# Patient Record
Sex: Female | Born: 1960 | Race: White | Hispanic: No | Marital: Single | Smoking: Never smoker
Health system: Southern US, Community
[De-identification: ages and names within clinical notes are randomized; demographics above are authoritative.]

## PROBLEM LIST (undated history)

## (undated) ENCOUNTER — Ambulatory Visit: Admission: EM

## (undated) DIAGNOSIS — F329 Major depressive disorder, single episode, unspecified: Secondary | ICD-10-CM

## (undated) DIAGNOSIS — R112 Nausea with vomiting, unspecified: Secondary | ICD-10-CM

## (undated) DIAGNOSIS — N809 Endometriosis, unspecified: Secondary | ICD-10-CM

## (undated) DIAGNOSIS — K219 Gastro-esophageal reflux disease without esophagitis: Secondary | ICD-10-CM

## (undated) DIAGNOSIS — E109 Type 1 diabetes mellitus without complications: Secondary | ICD-10-CM

## (undated) DIAGNOSIS — F32A Depression, unspecified: Secondary | ICD-10-CM

## (undated) DIAGNOSIS — Z8489 Family history of other specified conditions: Secondary | ICD-10-CM

## (undated) DIAGNOSIS — D219 Benign neoplasm of connective and other soft tissue, unspecified: Secondary | ICD-10-CM

## (undated) DIAGNOSIS — Z9889 Other specified postprocedural states: Secondary | ICD-10-CM

## (undated) DIAGNOSIS — F419 Anxiety disorder, unspecified: Secondary | ICD-10-CM

## (undated) DIAGNOSIS — I10 Essential (primary) hypertension: Secondary | ICD-10-CM

## (undated) DIAGNOSIS — Z8619 Personal history of other infectious and parasitic diseases: Secondary | ICD-10-CM

## (undated) DIAGNOSIS — R011 Cardiac murmur, unspecified: Secondary | ICD-10-CM

## (undated) DIAGNOSIS — E119 Type 2 diabetes mellitus without complications: Secondary | ICD-10-CM

## (undated) DIAGNOSIS — F411 Generalized anxiety disorder: Secondary | ICD-10-CM

## (undated) DIAGNOSIS — F321 Major depressive disorder, single episode, moderate: Secondary | ICD-10-CM

## (undated) HISTORY — PX: ABDOMINAL HYSTERECTOMY: SHX81

## (undated) HISTORY — DX: Personal history of other infectious and parasitic diseases: Z86.19

## (undated) HISTORY — PX: TONSILLECTOMY: SHX5217

## (undated) HISTORY — DX: Anxiety disorder, unspecified: F41.9

## (undated) HISTORY — DX: Essential (primary) hypertension: I10

## (undated) HISTORY — PX: SHOULDER SURGERY: SHX246

## (undated) HISTORY — PX: TONSILLECTOMY: SUR1361

## (undated) HISTORY — DX: Major depressive disorder, single episode, unspecified: F32.9

## (undated) HISTORY — DX: Type 1 diabetes mellitus without complications: E10.9

## (undated) HISTORY — DX: Type 2 diabetes mellitus without complications: E11.9

## (undated) HISTORY — DX: Depression, unspecified: F32.A

## (undated) HISTORY — DX: Major depressive disorder, single episode, moderate: F32.1

## (undated) HISTORY — DX: Generalized anxiety disorder: F41.1

## (undated) HISTORY — PX: EYE SURGERY: SHX253

---

## 1998-09-23 ENCOUNTER — Emergency Department (HOSPITAL_COMMUNITY): Admission: EM | Admit: 1998-09-23 | Discharge: 1998-09-23 | Payer: Self-pay | Admitting: Emergency Medicine

## 1998-09-23 ENCOUNTER — Encounter: Payer: Self-pay | Admitting: Emergency Medicine

## 1999-08-07 ENCOUNTER — Emergency Department (HOSPITAL_COMMUNITY): Admission: EM | Admit: 1999-08-07 | Discharge: 1999-08-07 | Payer: Self-pay | Admitting: Emergency Medicine

## 2003-11-10 ENCOUNTER — Ambulatory Visit (HOSPITAL_COMMUNITY): Admission: RE | Admit: 2003-11-10 | Discharge: 2003-11-10 | Payer: Self-pay | Admitting: Orthopedic Surgery

## 2003-11-10 ENCOUNTER — Ambulatory Visit (HOSPITAL_BASED_OUTPATIENT_CLINIC_OR_DEPARTMENT_OTHER): Admission: RE | Admit: 2003-11-10 | Discharge: 2003-11-10 | Payer: Self-pay | Admitting: Orthopedic Surgery

## 2004-01-22 ENCOUNTER — Ambulatory Visit: Payer: Self-pay | Admitting: Obstetrics and Gynecology

## 2004-04-25 ENCOUNTER — Ambulatory Visit (HOSPITAL_COMMUNITY): Admission: RE | Admit: 2004-04-25 | Discharge: 2004-04-25 | Payer: Self-pay | Admitting: Endocrinology

## 2004-09-06 ENCOUNTER — Ambulatory Visit: Payer: Self-pay | Admitting: Family Medicine

## 2004-10-24 ENCOUNTER — Encounter: Admission: RE | Admit: 2004-10-24 | Discharge: 2004-10-24 | Payer: Self-pay | Admitting: Surgery

## 2005-02-05 ENCOUNTER — Ambulatory Visit: Payer: Self-pay | Admitting: Obstetrics and Gynecology

## 2005-02-18 ENCOUNTER — Ambulatory Visit: Payer: Self-pay | Admitting: Obstetrics and Gynecology

## 2005-02-21 ENCOUNTER — Ambulatory Visit: Payer: Self-pay | Admitting: Obstetrics and Gynecology

## 2005-08-20 ENCOUNTER — Ambulatory Visit: Payer: Self-pay | Admitting: Obstetrics and Gynecology

## 2006-02-23 ENCOUNTER — Ambulatory Visit: Payer: Self-pay | Admitting: Obstetrics and Gynecology

## 2007-03-18 ENCOUNTER — Ambulatory Visit: Payer: Self-pay | Admitting: Obstetrics and Gynecology

## 2008-06-21 ENCOUNTER — Ambulatory Visit: Payer: Self-pay | Admitting: Obstetrics and Gynecology

## 2008-12-05 ENCOUNTER — Encounter: Admission: RE | Admit: 2008-12-05 | Discharge: 2008-12-05 | Payer: Self-pay | Admitting: Endocrinology

## 2008-12-06 ENCOUNTER — Emergency Department (HOSPITAL_COMMUNITY): Admission: EM | Admit: 2008-12-06 | Discharge: 2008-12-06 | Payer: Self-pay | Admitting: Emergency Medicine

## 2008-12-07 ENCOUNTER — Inpatient Hospital Stay: Payer: Self-pay | Admitting: Obstetrics and Gynecology

## 2009-06-26 ENCOUNTER — Ambulatory Visit: Payer: Self-pay | Admitting: Obstetrics and Gynecology

## 2010-05-16 LAB — URINALYSIS, ROUTINE W REFLEX MICROSCOPIC
Glucose, UA: >1000 mg/dL — AB
Specific Gravity, Urine: 1.04 — ABNORMAL HIGH (ref 1.005–1.030)
Urobilinogen, UA: 1 mg/dL (ref 0.0–1.0)
pH: 5.5 (ref 5.0–8.0)

## 2010-05-16 LAB — COMPREHENSIVE METABOLIC PANEL
ALT: 60 U/L — ABNORMAL HIGH (ref 0–35)
Alkaline Phosphatase: 148 U/L — ABNORMAL HIGH (ref 39–117)
Calcium: 9 mg/dL (ref 8.4–10.5)
Chloride: 102 mEq/L (ref 96–112)
Creatinine, Ser: 0.86 mg/dL (ref 0.4–1.2)
GFR calc Af Amer: >60 mL/min (ref >60)
GFR calc non Af Amer: >60 mL/min (ref >60)
Glucose, Bld: 249 mg/dL — ABNORMAL HIGH (ref 70–99)
Potassium: 3.5 mEq/L (ref 3.5–5.1)
Sodium: 137 mEq/L (ref 135–145)

## 2010-05-16 LAB — DIFFERENTIAL
Basophils Absolute: 0 10*3/uL (ref 0.0–0.1)
Basophils Relative: 0 % (ref 0–1)
Eosinophils Absolute: 0 10*3/uL (ref 0.0–0.7)
Neutrophils Relative %: 83 % — ABNORMAL HIGH (ref 43–77)

## 2010-05-16 LAB — CBC
HCT: 35.2 % — ABNORMAL LOW (ref 36.0–46.0)
Hemoglobin: 11.9 g/dL — ABNORMAL LOW (ref 12.0–15.0)
MCHC: 33.8 g/dL (ref 30.0–36.0)
Platelets: 338 10*3/uL (ref 150–400)
RBC: 4.21 MIL/uL (ref 3.87–5.11)
RDW: 14 % (ref 11.5–15.5)

## 2010-05-16 LAB — GC/CHLAMYDIA PROBE AMP, GENITAL: Chlamydia, DNA Probe: NEGATIVE

## 2010-05-16 LAB — WET PREP, GENITAL
Clue Cells Wet Prep HPF POC: NONE SEEN
Yeast Wet Prep HPF POC: NONE SEEN

## 2010-05-16 LAB — URINE MICROSCOPIC-ADD ON

## 2010-06-28 NOTE — Op Note (Signed)
NAME:  Diana Dixon, Diana Dixon               ACCOUNT NO.:  [id number]   MEDICAL RECORD NO.:  [id number]          PATIENT TYPE:  AMB   LOCATION:  DSC                          FACILITY:  [facility]   PHYSICIAN:  Katy Fitch. Sypher Montez Hageman., M.D.DATE OF BIRTH:  Jan 28, 1961   DATE OF PROCEDURE:  11/10/2003  DATE OF DISCHARGE:                                 OPERATIVE REPORT   PREOPERATIVE DIAGNOSIS:  Chronic adhesive capsulitis, right shoulder,  unresponsive to outpatient physical therapy.   POSTOPERATIVE DIAGNOSIS:  Chronic adhesive capsulitis, right shoulder,  unresponsive to outpatient physical therapy.   OPERATION:  Examination of right shoulder under anesthesia with capsular  manipulation to lyse capsular adhesions.   OPERATING SURGEON:  Katy Fitch. Sypher, M.D.   ASSISTANT:  None.   ANESTHESIA:  General by mask.   SUPERVISING ANESTHESIOLOGIST:  Guadalupe Maple, M.D.   INDICATIONS:  [name] is a 50 year old woman with a history of insulin-  dependent diabetes.  She is managed by Reather Littler, M.D.  She has had a  history of right shoulder pain and findings compatible with adhesive  capsulitis, unresponsive to more than three months of therapy.  She has been  quite uncomfortable and has had difficulty resting at night, due to her  shoulder discomfort.  We offered examination of the shoulder under  anesthesia versus arthroscopic capsulectomy.  She elected to proceed with  the simpler procedure of manipulation at this time.  Preoperatively, she was  advised that we can improve the range of motion but not typically to a full  range of motion, following manipulation.  With dedicated therapy, and effort  on her part, she should be able to recover a near full range of motion over  six months.  After informed consent, she is brought to the operating room,  at this time.   PROCEDURE:  [name] is brought to the operating room and placed in  supine position upon the operating room table.  Following  induction of  general anesthesia, by mask, the right arm was carefully examined under  anesthesia.  Initial range of motion revealed elevation to 150 degrees,  external rotation to 60 degrees, and internal rotation of less than 10  degrees.  With gentle manipulation, we are able to increase the elevation,  combined to 180 degrees, external rotation of 90 degrees, and 90 degrees  abduction, with scapular stabilization and internal rotation of 75 degrees,  with 90 degrees abduction.  Following gentle manipulation, a mixture of  50:50 0.25% Marcaine and 1% lidocaine was infiltrated intracapsulary, to an  anterior approach.  Care was taken to identify the coracoid process and to  stay superior and lateral to the coracoid, with injection above the  subscapularis tendon, into the joint space.   The vital signs were carefully followed for five minutes, following  injection.  There was no sign of change in her pulse or pressure.  Epinephrine was not utilized.   She was subsequently wakened from general anesthesia and transferred to the  recovery room with stable vital signs.       RVS/MEDQ  D:  11/10/2003  T:  11/10/2003  Job:  [id number]   cc:   Katy Fitch. Naaman Plummer., M.D.  [address]  [place]  [place] [address]  Fax: [phone]   Reather Littler, M.D.  [address]  [place]  [place] [address]  Fax: [phone]

## 2010-07-01 ENCOUNTER — Ambulatory Visit: Payer: Self-pay | Admitting: Obstetrics and Gynecology

## 2011-07-07 ENCOUNTER — Ambulatory Visit: Payer: Self-pay | Admitting: Obstetrics and Gynecology

## 2012-02-06 ENCOUNTER — Ambulatory Visit: Payer: Self-pay | Admitting: Unknown Physician Specialty

## 2012-05-21 ENCOUNTER — Other Ambulatory Visit: Payer: Self-pay

## 2012-08-04 ENCOUNTER — Ambulatory Visit: Payer: Self-pay | Admitting: Obstetrics and Gynecology

## 2012-08-16 ENCOUNTER — Telehealth: Payer: Self-pay | Admitting: *Deleted

## 2012-08-16 NOTE — Telephone Encounter (Signed)
Pt called requesting a refill of Protonix. Please advise.

## 2012-08-16 NOTE — Telephone Encounter (Signed)
May refill 

## 2012-08-17 ENCOUNTER — Other Ambulatory Visit: Payer: Self-pay | Admitting: Endocrinology

## 2012-08-17 MED ORDER — PANTOPRAZOLE SODIUM 40 MG PO TBEC: 40.0000 mg | DELAYED_RELEASE_TABLET | Freq: Every day | ORAL | Status: DC

## 2012-08-30 ENCOUNTER — Other Ambulatory Visit: Payer: Self-pay | Admitting: *Deleted

## 2012-08-30 MED ORDER — AMLODIPINE-OLMESARTAN 5-40 MG PO TABS: 1.0000 | ORAL_TABLET | Freq: Every day | ORAL | Status: DC

## 2012-09-03 ENCOUNTER — Other Ambulatory Visit: Payer: Self-pay | Admitting: *Deleted

## 2012-09-03 MED ORDER — INSULIN PEN NEEDLE 32G X 4 MM MISC: Status: DC

## 2012-09-15 ENCOUNTER — Other Ambulatory Visit: Payer: Self-pay | Admitting: *Deleted

## 2012-09-15 DIAGNOSIS — E1065 Type 1 diabetes mellitus with hyperglycemia: Secondary | ICD-10-CM

## 2012-09-17 ENCOUNTER — Other Ambulatory Visit: Payer: Self-pay

## 2012-09-17 ENCOUNTER — Ambulatory Visit (INDEPENDENT_AMBULATORY_CARE_PROVIDER_SITE_OTHER): Payer: Managed Care, Other (non HMO) | Admitting: Endocrinology

## 2012-09-17 ENCOUNTER — Encounter: Payer: Self-pay | Admitting: Endocrinology

## 2012-09-17 VITALS — BP 118/50 | HR 78 | Temp 98.5°F | Resp 12 | Ht 62.0 in | Wt 111.6 lb

## 2012-09-17 DIAGNOSIS — F3289 Other specified depressive episodes: Secondary | ICD-10-CM

## 2012-09-17 DIAGNOSIS — F32A Depression, unspecified: Secondary | ICD-10-CM

## 2012-09-17 DIAGNOSIS — I10 Essential (primary) hypertension: Secondary | ICD-10-CM

## 2012-09-17 DIAGNOSIS — E1065 Type 1 diabetes mellitus with hyperglycemia: Secondary | ICD-10-CM

## 2012-09-17 DIAGNOSIS — F329 Major depressive disorder, single episode, unspecified: Secondary | ICD-10-CM

## 2012-09-17 DIAGNOSIS — IMO0002 Reserved for concepts with insufficient information to code with codable children: Secondary | ICD-10-CM

## 2012-09-17 LAB — COMPREHENSIVE METABOLIC PANEL
ALT: 19 U/L (ref 0–35)
AST: 16 U/L (ref 0–37)
CO2: 30 mEq/L (ref 19–32)
Glucose, Bld: 138 mg/dL — ABNORMAL HIGH (ref 70–99)
Potassium: 4.1 mEq/L (ref 3.5–5.1)
Sodium: 138 mEq/L (ref 135–145)

## 2012-09-17 LAB — URINALYSIS
Ketones, ur: NEGATIVE
Nitrite: NEGATIVE

## 2012-09-17 LAB — HEMOGLOBIN A1C: Hgb A1c MFr Bld: 9.2 % — ABNORMAL HIGH (ref 4.6–6.5)

## 2012-09-17 LAB — MICROALBUMIN / CREATININE URINE RATIO: Creatinine,U: 169.4 mg/dL

## 2012-09-17 NOTE — Progress Notes (Signed)
Patient ID: Diana Dixon, female   DOB: 1960-03-28, 52 y.o.   MRN: [id number]  Diana Dixon is an 52 y.o. female.   Reason for Appointment: Diabetes follow-up   History of Present Illness   Diagnosis: Type 1 DIABETES MELITUS, diagnosis made in 1967     Insulin regimen: Lantus 12 twice a day, NovoLog 6-8 units at breakfast and 8 units at lunch and supper  She has had long-standing type 1 diabetes with persistently poor control because of variability in blood sugars and difficulty with mealtime coverage She has been reluctant to do carbohydrate counting to adjust her insulin doses and has variable readings after meals especially lunch Her fasting readings are also quite variable and she does not know why. Compliance with her insulin has been better but she may rarely forget her Lantus in the evening and also mealtime dose She may have done a little better with taking Lantus twice a day but does not know how to adjust this on her own and even though her sugars at suppertime are higher she does not change her dosage Her control is also compounded by the fact that she likes to drink various kinds of drinks with sugar added and also eating unbalanced meals like Pop tarts for breakfast Her A1c is generally over 8%          Proper timing of medications in relation to meals: Yes.         Monitors blood glucose: Once a day.    Glucometer: One Touch.          Blood Glucose readings from meter download: readings before breakfast:Median reading is 205 but range 88-350 Midmorning 66-243. Midday 124-279 with few readings. Afternoon 43-301, suppertime 141-429 and around 8-9 PM 62-291 Overall highest median reading is early evening and median readings over 200 most of the day   Hypoglycemia frequency:  occasionally after lunch, rarely after breakfast or supper and none overnight .          Meals: 3 meals per day.          Physical activity: exercise:  trying to walk in the morning and 3 pm             Complications: None Lab Results  Component Value Date   MICROALBUR 1.9 09/17/2012      Medication List       This list is accurate as of: 09/17/12  4:53 PM.  Always use your most recent med list.               amLODipine-olmesartan 5-40 MG per tablet  Commonly known as:  AZOR  Take 1 tablet by mouth daily.     clindamycin 1 % external solution  Commonly known as:  CLEOCIN T     escitalopram 10 MG tablet  Commonly known as:  LEXAPRO  Take 10 mg by mouth daily.     Insulin Pen Needle 32G X 4 MM Misc  Commonly known as:  BD PEN NEEDLE NANO U/F  Use to inject insulin as directed     LANTUS SOLOSTAR 100 UNIT/ML Sopn  Generic drug:  Insulin Glargine  Inject 12 Units into the skin 2 (two) times daily.     NOVOLOG FLEXPEN 100 UNIT/ML Sopn FlexPen  Generic drug:  insulin aspart  Inject 8 Units into the skin 3 (three) times daily with meals.     nystatin-triamcinolone cream  Commonly known as:  MYCOLOG II     ONE TOUCH  ULTRA TEST test strip  Generic drug:  glucose blood  1 each by Other route as needed. Test 7 times daily     pantoprazole 40 MG tablet  Commonly known as:  PROTONIX  Take 1 tablet (40 mg total) by mouth daily.     PARoxetine 12.5 MG 24 hr tablet  Commonly known as:  PAXIL-CR        Allergies:  Allergies  Allergen Reactions  . Penicillins   . Sulfa Antibiotics     Past Medical History  Diagnosis Date  . Depression   . Diabetes mellitus without complication     Type I    History reviewed. No pertinent past surgical history.  No family history on file.  Social History:  reports that she has never smoked. She does not have any smokeless tobacco history on file. She reports that she does not drink alcohol or use illicit drugs.  Review of Systems:  HYPERTENSION:  she has had long-standing hypertension treated with Azor. Does not check blood pressure at home She does feel a little tired and dizzy headed occasionally  No history of  hyperlipidemia  She does take Protonix for reflux/heartburn  Also has had mild long-standing depression controlled with Paxil     Examination:   BP 110/58  Pulse 78  Temp(Src) 98.5 F (36.9 C) (Oral)  Resp 12  Ht 5\' 2"  (1.575 m)  Wt 111 lb 9.6 oz (50.621 kg)  BMI 20.41 kg/m2  SpO2 98%  Body mass index is 20.41 kg/(m^2).   ASSESSMENT/ PLAN::   Diabetes type 1   The patient's diabetes appears to be poorly controlled and his having significant variability of blood sugars at all times However her median reading is over 200 at most of the times of the day She also has had periodic hypoglycemia especially after lunch from overestimating her mealtime requirement Her fasting readings are also quite variable and she does not know why. Compliance with her insulin has been better but she may rarely forget her Lantus in the evening and also mealtime dose She may have done a little better with taking Lantus twice a day but does not know how to adjust this on her own and even though her sugars at suppertime are higher she does not change her dosage Her control is also compounded by the fact that she likes to drink various kinds of drinks with sugar added and also eating unbalanced meals like Pop tarts for breakfast   HYPERTENSION: Her blood pressure is relatively low and we'll reduce her dose of Azor to 5/20 instead of 5/40  Counseling time over 50% of today's 25 minute visit  Diana Dixon 09/17/2012, 4:53 PM   Addendum: A1c high at 9.2, patient informed   Office Visit on 09/17/2012  Component Date Value Range Status  . Hemoglobin A1C 09/17/2012 9.2* 4.6 - 6.5 % Final   Glycemic Control Guidelines for People with Diabetes:Non Diabetic:  <6%Goal of Therapy: <7%Additional Action Suggested:  >8%   . Sodium 09/17/2012 138  135 - 145 mEq/L Final  . Potassium 09/17/2012 4.1  3.5 - 5.1 mEq/L Final  . Chloride 09/17/2012 102  96 - 112 mEq/L Final  . CO2 09/17/2012 30  19 - 32 mEq/L Final  .  Glucose, Bld 09/17/2012 138* 70 - 99 mg/dL Final  . BUN 16/11/9602 16  6 - 23 mg/dL Final  . Creatinine, Ser 09/17/2012 1.0  0.4 - 1.2 mg/dL Final  . Total Bilirubin 09/17/2012 0.5  0.3 -  1.2 mg/dL Final  . Alkaline Phosphatase 09/17/2012 65  39 - 117 U/L Final  . AST 09/17/2012 16  0 - 37 U/L Final  . ALT 09/17/2012 19  0 - 35 U/L Final  . Total Protein 09/17/2012 6.6  6.0 - 8.3 g/dL Final  . Albumin 16/11/9602 4.1  3.5 - 5.2 g/dL Final  . Calcium 54/10/8117 9.6  8.4 - 10.5 mg/dL Final  . GFR 14/78/2956 61.95  >60.00 mL/min Final  . Microalb, Ur 09/17/2012 1.9  0.0 - 1.9 mg/dL Final  . Creatinine,U 21/30/8657 169.4   Final  . Microalb Creat Ratio 09/17/2012 1.1  0.0 - 30.0 mg/g Final  . Color, Urine 09/17/2012 LT. YELLOW  Yellow;Lt. Yellow Final  . APPearance 09/17/2012 CLEAR  Clear Final  . Specific Gravity, Urine 09/17/2012 1.025  1.000-1.030 Final  . pH 09/17/2012 6.0  5.0 - 8.0 Final  . Total Protein, Urine 09/17/2012 NEGATIVE  Negative Final  . Urine Glucose 09/17/2012 NEGATIVE  Negative Final  . Ketones, ur 09/17/2012 NEGATIVE  Negative Final  . Bilirubin Urine 09/17/2012 NEGATIVE  Negative Final  . Hgb urine dipstick 09/17/2012 NEGATIVE  Negative Final  . Urobilinogen, UA 09/17/2012 1.0  0.0 - 1.0 Final  . Leukocytes, UA 09/17/2012 NEGATIVE  Negative Final  . Nitrite 09/17/2012 NEGATIVE  Negative Final

## 2012-09-17 NOTE — Patient Instructions (Addendum)
Lantus 14 in am and 12 at night  Try only 5 Novolog at lunch if only having 1 starch or less and no sweets  Mark readings after meals on meter  No juice except at meals  Azor 5/20

## 2012-09-18 NOTE — Progress Notes (Signed)
Quick Note:  Please let patient know that the A1c was 9.2 and needs to improve diet also ______

## 2012-09-20 ENCOUNTER — Telehealth: Payer: Self-pay | Admitting: *Deleted

## 2012-09-20 ENCOUNTER — Other Ambulatory Visit: Payer: Self-pay | Admitting: *Deleted

## 2012-09-20 MED ORDER — AMLODIPINE-OLMESARTAN 5-20 MG PO TABS: 1.0000 | ORAL_TABLET | Freq: Every day | ORAL | Status: DC

## 2012-09-20 NOTE — Telephone Encounter (Signed)
Message copied by Hermenia Bers on Mon Sep 20, 2012  2:14 PM ------      Message from: Reather Littler      Created: Sat Sep 18, 2012  6:46 PM       Please let patient know that the A1c was 9.2 and needs to improve diet also ------

## 2012-09-20 NOTE — Telephone Encounter (Signed)
Pt is aware of results. 

## 2012-09-20 NOTE — Telephone Encounter (Signed)
Message copied by Hermenia Bers on Mon Sep 20, 2012  3:51 PM ------      Message from: Reather Littler      Created: Sat Sep 18, 2012  6:46 PM       Please let patient know that the A1c was 9.2 and needs to improve diet also ------

## 2012-09-20 NOTE — Telephone Encounter (Signed)
Left message for patient to call back  

## 2012-09-21 ENCOUNTER — Encounter: Payer: Self-pay | Admitting: Endocrinology

## 2012-09-21 DIAGNOSIS — I10 Essential (primary) hypertension: Secondary | ICD-10-CM | POA: Insufficient documentation

## 2012-09-21 DIAGNOSIS — F32A Depression, unspecified: Secondary | ICD-10-CM | POA: Insufficient documentation

## 2012-09-21 DIAGNOSIS — F329 Major depressive disorder, single episode, unspecified: Secondary | ICD-10-CM | POA: Insufficient documentation

## 2012-10-08 ENCOUNTER — Telehealth: Payer: Self-pay | Admitting: Endocrinology

## 2012-10-08 MED ORDER — INSULIN GLARGINE 100 UNIT/ML SOLOSTAR PEN: 12.0000 [IU] | PEN_INJECTOR | Freq: Two times a day (BID) | SUBCUTANEOUS | Status: DC

## 2012-10-08 NOTE — Telephone Encounter (Signed)
rx sent

## 2012-10-13 ENCOUNTER — Emergency Department (HOSPITAL_COMMUNITY)
Admission: EM | Admit: 2012-10-13 | Discharge: 2012-10-13 | Disposition: A | Discharge status: Home / Self Care | Payer: Managed Care, Other (non HMO) | Source: Home / Self Care | Attending: Emergency Medicine | Admitting: Emergency Medicine

## 2012-10-13 ENCOUNTER — Encounter (HOSPITAL_COMMUNITY): Payer: Self-pay | Admitting: Emergency Medicine

## 2012-10-13 DIAGNOSIS — S01502A Unspecified open wound of oral cavity, initial encounter: Secondary | ICD-10-CM

## 2012-10-13 DIAGNOSIS — Z23 Encounter for immunization: Secondary | ICD-10-CM

## 2012-10-13 DIAGNOSIS — S01512A Laceration without foreign body of oral cavity, initial encounter: Secondary | ICD-10-CM

## 2012-10-13 MED ORDER — SODIUM BICARBONATE 4 % IV SOLN
INTRAVENOUS | Status: AC
  Filled 2012-10-13: qty 5

## 2012-10-13 MED ORDER — HYDROCODONE-ACETAMINOPHEN 5-325 MG PO TABS: ORAL_TABLET | ORAL | Status: DC

## 2012-10-13 MED ORDER — TETANUS-DIPHTH-ACELL PERTUSSIS 5-2.5-18.5 LF-MCG/0.5 IM SUSP
INTRAMUSCULAR | Status: AC
  Filled 2012-10-13: qty 0.5

## 2012-10-13 MED ORDER — TETANUS-DIPHTH-ACELL PERTUSSIS 5-2.5-18.5 LF-MCG/0.5 IM SUSP
0.5000 mL | Freq: Once | INTRAMUSCULAR | Status: AC
  Administered 2012-10-13: 0.5 mL via INTRAMUSCULAR

## 2012-10-13 MED ORDER — CLINDAMYCIN HCL 300 MG PO CAPS: 300.0000 mg | ORAL_CAPSULE | Freq: Four times a day (QID) | ORAL | Status: DC
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
## 2012-10-18 ENCOUNTER — Telehealth (HOSPITAL_COMMUNITY): Payer: Self-pay | Admitting: *Deleted

## 2012-10-18 MED ORDER — NYSTATIN 100000 UNIT/ML MT SUSP: 500000.0000 [IU] | Freq: Four times a day (QID) | OROMUCOSAL | Status: DC

## 2012-10-18 NOTE — ED Notes (Signed)
She has some sores: Her mouth. This is probably fungal infection rather than a true allergic reaction. Suggested she continue on with the Cleocin and use a nystatin suspension.  Reuben Likes, MD 10/18/12 [phone]

## 2012-10-18 NOTE — ED Notes (Signed)
Pt. called on VM and said she thinks she is having an allergic reaction to the medicine prescribed by Dr. Lorenz Coaster. She said she is getting blisters in the corners of her lips.  Discussed with Dr. Lorenz Coaster and he asked if she was allergic to PCN. I said yes and Sulfa drugs also. He said that is why he had to prescribe Cleocin. He said it did not sound like an allergic reaction but more like a fungal infection.  He will prescribe an antifungal mouthwash. I called pt. and left a message to call. Vassie Moselle 10/18/2012

## 2012-10-27 ENCOUNTER — Inpatient Hospital Stay: Payer: Self-pay | Admitting: Surgery

## 2012-10-27 LAB — CBC
HCT: 39 % (ref 35.0–47.0)
MCH: 26.4 pg (ref 26.0–34.0)
MCV: 82 fL (ref 80–100)
Platelet: 291 10*3/uL (ref 150–440)
RBC: 4.76 10*6/uL (ref 3.80–5.20)
RDW: 13.3 % (ref 11.5–14.5)

## 2012-10-27 LAB — URINALYSIS, COMPLETE
Bilirubin,UR: NEGATIVE
Blood: NEGATIVE
Glucose,UR: 100 mg/dL (ref 0–75)
Leukocyte Esterase: NEGATIVE
Nitrite: NEGATIVE
Protein: NEGATIVE
RBC,UR: NONE SEEN /HPF (ref 0–5)
Specific Gravity: 1.015 (ref 1.003–1.030)
Squamous Epithelial: 2
WBC UR: 1 /HPF (ref 0–5)

## 2012-10-27 LAB — COMPREHENSIVE METABOLIC PANEL
Albumin: 3.8 g/dL (ref 3.4–5.0)
Alkaline Phosphatase: 84 U/L (ref 50–136)
BUN: 14 mg/dL (ref 7–18)
Calcium, Total: 9.6 mg/dL (ref 8.5–10.1)
Chloride: 101 mmol/L (ref 98–107)
Co2: 28 mmol/L (ref 21–32)
EGFR (African American): >60
EGFR (Non-African Amer.): >60
Osmolality: 281 (ref 275–301)
Potassium: 4.4 mmol/L (ref 3.5–5.1)
SGOT(AST): 22 U/L (ref 15–37)
SGPT (ALT): 18 U/L (ref 12–78)

## 2012-10-27 LAB — LIPASE, BLOOD: Lipase: 119 U/L (ref 73–393)

## 2012-10-28 LAB — BASIC METABOLIC PANEL
Anion Gap: 4 — ABNORMAL LOW (ref 7–16)
BUN: 11 mg/dL (ref 7–18)
Calcium, Total: 8.8 mg/dL (ref 8.5–10.1)
Chloride: 106 mmol/L (ref 98–107)
Creatinine: 0.95 mg/dL (ref 0.60–1.30)
Glucose: 282 mg/dL — ABNORMAL HIGH (ref 65–99)
Potassium: 4.3 mmol/L (ref 3.5–5.1)
Sodium: 137 mmol/L (ref 136–145)

## 2012-10-28 LAB — CBC WITH DIFFERENTIAL/PLATELET
Basophil %: 0.7 %
Eosinophil #: 0.1 10*3/uL (ref 0.0–0.7)
Eosinophil %: 2 %
HCT: 39.6 % (ref 35.0–47.0)
Lymphocyte %: 18.2 %
MCHC: 32.1 g/dL (ref 32.0–36.0)
Monocyte %: 6.5 %
Neutrophil #: 4.6 10*3/uL (ref 1.4–6.5)
Neutrophil %: 72.6 %
WBC: 6.4 10*3/uL (ref 3.6–11.0)

## 2012-12-22 ENCOUNTER — Other Ambulatory Visit (INDEPENDENT_AMBULATORY_CARE_PROVIDER_SITE_OTHER): Payer: Managed Care, Other (non HMO)

## 2012-12-22 ENCOUNTER — Encounter: Payer: Self-pay | Admitting: Endocrinology

## 2012-12-22 ENCOUNTER — Ambulatory Visit (INDEPENDENT_AMBULATORY_CARE_PROVIDER_SITE_OTHER): Payer: Managed Care, Other (non HMO) | Admitting: Endocrinology

## 2012-12-22 ENCOUNTER — Encounter: Payer: Managed Care, Other (non HMO) | Attending: Endocrinology | Admitting: Nutrition

## 2012-12-22 VITALS — BP 132/52 | HR 84 | Temp 98.4°F | Resp 12 | Ht 62.0 in | Wt 115.7 lb

## 2012-12-22 DIAGNOSIS — I10 Essential (primary) hypertension: Secondary | ICD-10-CM

## 2012-12-22 DIAGNOSIS — E1065 Type 1 diabetes mellitus with hyperglycemia: Secondary | ICD-10-CM

## 2012-12-22 DIAGNOSIS — IMO0002 Reserved for concepts with insufficient information to code with codable children: Secondary | ICD-10-CM | POA: Insufficient documentation

## 2012-12-22 DIAGNOSIS — Z713 Dietary counseling and surveillance: Secondary | ICD-10-CM | POA: Insufficient documentation

## 2012-12-22 LAB — BASIC METABOLIC PANEL
Calcium: 9.4 mg/dL (ref 8.4–10.5)
Chloride: 101 mEq/L (ref 96–112)
Creatinine, Ser: 0.8 mg/dL (ref 0.4–1.2)
GFR: 75.67 mL/min (ref >60.00)
Sodium: 136 mEq/L (ref 135–145)

## 2012-12-22 LAB — LIPID PANEL
Cholesterol: 203 mg/dL — ABNORMAL HIGH (ref 0–200)
Triglycerides: 91 mg/dL (ref 0.0–149.0)
VLDL: 18.2 mg/dL (ref 0.0–40.0)

## 2012-12-22 NOTE — Progress Notes (Signed)
Pt. Was given a One Touch Verio meter and shown how to use it.  She re demonstrated the procedure correctly.  She was also shown how to mark the post prandial readings and did this correctly.  She was told to test her blood sugars fasting and 2hr. pc one reading daily.  She agreed to do this.   We also reviewed how and when she is taking her insulin doses.  Strongly encouraged her to take he Novolog before her meals, in stead of after.  She agreed to do this.   We also discussed the need to adjust the dose, depending on the amount of food eaten. She reported good understanding of this.

## 2012-12-22 NOTE — Patient Instructions (Addendum)
Crystal Light and no sugar  Lantus 12 in am but go to 13 if supper > 150  LANTUS 14 AT NIGHT  TAKE AM HUMALOG BEFORE BFST  NUTS OR LOW FAT MEAT/EGG IN AM

## 2012-12-22 NOTE — Progress Notes (Signed)
Patient ID: Diana Dixon, female   DOB: July 14, 1960, 52 y.o.   MRN: [id number]  Diana Dixon is an 52 y.o. female.   Reason for Appointment: Diabetes follow-up   History of Present Illness   Diagnosis: Type 1 DIABETES MELITUS, diagnosis made in 1967     Insulin regimen: Lantus 12 twice a day, NovoLog 8 units at breakfast and 8 units at lunch and supper  She has had long-standing type 1 diabetes with persistently poor control because of variability in blood sugars and difficulty with dietary compliance She has been reluctant to do carbohydrate counting to adjust her insulin doses and has variable readings after meals especially lunch Compliance with her insulin has been better but previously has had times when she would forget her Lantus in the evening and also mealtime dose She may have done a little better with taking Lantus twice a day but does not know how to adjust this on her own  FASTING blood sugars are significantly high and the highest of the day but she does not adjust her Lantus in the evening for this Her A1c is consistently over 8%  Her control is also compounded by the fact that she likes to drink various kinds of drinks with sugar added at each meal and also eating unbalanced meals like Pop tarts for breakfast Other problems: Taking her morning insulin 15 minutes after eating and evening insulin 5-10 minutes after Sometimes checking her blood sugar only 10 minutes after her evening meal          Proper timing of medications in relation to meals:  no        Monitors blood glucose: Once a day.    Glucometer: One Touch ultra 2           Blood Glucose readings from meter download: Not clear if readings are accurate since some of the readings have  incorrect date or time  Readings before breakfast: 144-291, median 239 Midmorning 63-269 with average 145 Midday 49-359 with median 196 Midafternoon median 152,  median around 7-10 PM = 199 with range 125-298  Hypoglycemia  frequency:  occasionally late morning and sporadically in the afternoon or evening, has low readings only on one day recently Meals: 3 meals per day.still eating  Pop Tart in am         Physical activity: exercise:  trying to walk once or twice a day           Complications: None Lab Results  Component Value Date   MICROALBUR 1.9 09/17/2012      Medication List       This list is accurate as of: 12/22/12  3:12 PM.  Always use your most recent med list.               amLODipine-olmesartan 5-20 MG per tablet  Commonly known as:  AZOR  Take 1 tablet by mouth daily.     clindamycin 1 % external solution  Commonly known as:  CLEOCIN T     clindamycin 300 MG capsule  Commonly known as:  CLEOCIN  Take 1 capsule (300 mg total) by mouth 4 (four) times daily.     escitalopram 10 MG tablet  Commonly known as:  LEXAPRO  Take 10 mg by mouth daily.     HYDROcodone-acetaminophen 5-325 MG per tablet  Commonly known as:  NORCO/VICODIN  1 to 2 tabs every 4 to 6 hours as needed for pain.     Insulin Glargine  100 UNIT/ML Sopn  Commonly known as:  LANTUS SOLOSTAR  Inject 12 Units into the skin 2 (two) times daily.     Insulin Pen Needle 32G X 4 MM Misc  Commonly known as:  BD PEN NEEDLE NANO U/F  Use to inject insulin as directed     NOVOLOG FLEXPEN 100 UNIT/ML Sopn FlexPen  Generic drug:  insulin aspart  Inject 8 Units into the skin 3 (three) times daily with meals.     nystatin 100000 UNIT/ML suspension  Commonly known as:  MYCOSTATIN  Take 5 mLs (500,000 Units total) by mouth 4 (four) times daily.     nystatin-triamcinolone cream  Commonly known as:  MYCOLOG II     ONE TOUCH ULTRA TEST test strip  Generic drug:  glucose blood  1 each by Other route as needed. Test 7 times daily     pantoprazole 40 MG tablet  Commonly known as:  PROTONIX  Take 1 tablet (40 mg total) by mouth daily.     PARoxetine 12.5 MG 24 hr tablet  Commonly known as:  PAXIL-CR        Allergies:   Allergies  Allergen Reactions  . Penicillins   . Sulfa Antibiotics     Past Medical History  Diagnosis Date  . Depression   . Diabetes mellitus without complication     Type I    No past surgical history on file.  No family history on file.  Social History:  reports that she has never smoked. She does not have any smokeless tobacco history on file. She reports that she does not drink alcohol or use illicit drugs.  Review of Systems:  HYPERTENSION:  she has had long-standing hypertension treated with Azor. Does not check blood pressure at home  her dosage was reduced and she does not have any dizziness now  No history of hyperlipidemia  She does take Protonix for reflux/heartburn  Also has had mild long-standing depression controlled with Paxil     Examination:   BP 132/52  Pulse 84  Temp(Src) 98.4 F (36.9 C)  Resp 12  Ht 5\' 2"  (1.575 m)  Wt 115 lb 11.2 oz (52.481 kg)  BMI 21.16 kg/m2  SpO2 97%  Body mass index is 21.16 kg/(m^2).   ASSESSMENT/ PLAN::   Diabetes type 1   Her blood sugars have been poorly controlled with average at home 200 She still has difficulty with compliance with instructions for her diet, insulin adjustment and timing of insulin Has not had any diabetes education for several years  Problems identifiedToday:   Marked hyperglycemia overnight median reading of 240 before breakfast  Inability of the patient to titrate her insulin on her own especially Lantus  Not labeling her readings after meals despite instructions  Taking her mealtime insulin about 15 minutes after breakfast  Eating a high carbohydrate high glycemic index meal in the morning and no protein  Not checking blood sugar right before supper to help do a correction and checking the blood sugar sometime after eating  Tendency to low blood sugars late morning from unbalanced at breakfast and late insulin  Drinking regular soft drinks consistently at each  meal  PLAN:  She will switch to a Verio meter when she finishes her strips for the One Touch ultra She was instructed on this in detail by nurse educator and also advised about postprandial tagging as well as part and management  Crystal Light instead of regular drinks at every meal Add protein to  breakfast, specific examples given  Lantus 12 in am but go to 13 if supper > 150  LANTUS 14  units  in evening and adjust every 3 days to get morning sugar below 130, given flow sheet to do that  TAKE  Humalog   before breakfast in the morning  HYPERTENSION: Her blood pressure isbetter with reducing  her dose of Azor to 5/20 instead of 5/40  Counseling time over 50% of today's 25 minute visit  Diana Dixon 12/22/2012, 3:12 PM     No visits with results within 1 Week(s) from this visit. Latest known visit with results is:  Office Visit on 09/17/2012  Component Date Value Range Status  . Hemoglobin A1C 09/17/2012 9.2* 4.6 - 6.5 % Final   Glycemic Control Guidelines for People with Diabetes:Non Diabetic:  <6%Goal of Therapy: <7%Additional Action Suggested:  >8%   . Sodium 09/17/2012 138  135 - 145 mEq/L Final  . Potassium 09/17/2012 4.1  3.5 - 5.1 mEq/L Final  . Chloride 09/17/2012 102  96 - 112 mEq/L Final  . CO2 09/17/2012 30  19 - 32 mEq/L Final  . Glucose, Bld 09/17/2012 138* 70 - 99 mg/dL Final  . BUN 82/95/6213 16  6 - 23 mg/dL Final  . Creatinine, Ser 09/17/2012 1.0  0.4 - 1.2 mg/dL Final  . Total Bilirubin 09/17/2012 0.5  0.3 - 1.2 mg/dL Final  . Alkaline Phosphatase 09/17/2012 65  39 - 117 U/L Final  . AST 09/17/2012 16  0 - 37 U/L Final  . ALT 09/17/2012 19  0 - 35 U/L Final  . Total Protein 09/17/2012 6.6  6.0 - 8.3 g/dL Final  . Albumin 08/65/7846 4.1  3.5 - 5.2 g/dL Final  . Calcium 96/29/5284 9.6  8.4 - 10.5 mg/dL Final  . GFR 13/24/4010 61.95  >60.00 mL/min Final  . Microalb, Ur 09/17/2012 1.9  0.0 - 1.9 mg/dL Final  . Creatinine,U 27/25/3664 169.4   Final  .  Microalb Creat Ratio 09/17/2012 1.1  0.0 - 30.0 mg/g Final  . Color, Urine 09/17/2012 LT. YELLOW  Yellow;Lt. Yellow Final  . APPearance 09/17/2012 CLEAR  Clear Final  . Specific Gravity, Urine 09/17/2012 1.025  1.000-1.030 Final  . pH 09/17/2012 6.0  5.0 - 8.0 Final  . Total Protein, Urine 09/17/2012 NEGATIVE  Negative Final  . Urine Glucose 09/17/2012 NEGATIVE  Negative Final  . Ketones, ur 09/17/2012 NEGATIVE  Negative Final  . Bilirubin Urine 09/17/2012 NEGATIVE  Negative Final  . Hgb urine dipstick 09/17/2012 NEGATIVE  Negative Final  . Urobilinogen, UA 09/17/2012 1.0  0.0 - 1.0 Final  . Leukocytes, UA 09/17/2012 NEGATIVE  Negative Final  . Nitrite 09/17/2012 NEGATIVE  Negative Final

## 2012-12-23 ENCOUNTER — Other Ambulatory Visit: Payer: Self-pay | Admitting: *Deleted

## 2012-12-23 LAB — LDL CHOLESTEROL, DIRECT: Direct LDL: 130.7 mg/dL

## 2012-12-23 LAB — HEMOGLOBIN A1C: Hgb A1c MFr Bld: 9.1 % — ABNORMAL HIGH (ref 4.6–6.5)

## 2012-12-23 MED ORDER — ATORVASTATIN CALCIUM 10 MG PO TABS: 10.0000 mg | ORAL_TABLET | Freq: Every day | ORAL | Status: DC

## 2013-03-24 ENCOUNTER — Ambulatory Visit: Payer: Managed Care, Other (non HMO) | Admitting: Endocrinology

## 2013-03-24 ENCOUNTER — Other Ambulatory Visit: Payer: Managed Care, Other (non HMO)

## 2013-04-14 ENCOUNTER — Other Ambulatory Visit: Payer: Managed Care, Other (non HMO)

## 2013-04-14 ENCOUNTER — Ambulatory Visit: Payer: Managed Care, Other (non HMO) | Admitting: Endocrinology

## 2013-04-19 ENCOUNTER — Other Ambulatory Visit: Payer: Managed Care, Other (non HMO)

## 2013-04-22 ENCOUNTER — Ambulatory Visit: Payer: Managed Care, Other (non HMO) | Admitting: Endocrinology

## 2013-04-22 ENCOUNTER — Other Ambulatory Visit: Payer: Managed Care, Other (non HMO)

## 2013-05-01 LAB — COMPREHENSIVE METABOLIC PANEL
ALBUMIN: 3.8 g/dL (ref 3.4–5.0)
AST: 14 U/L — AB (ref 15–37)
Alkaline Phosphatase: 86 U/L
Anion Gap: 2 — ABNORMAL LOW (ref 7–16)
BUN: 16 mg/dL (ref 7–18)
Bilirubin,Total: 0.6 mg/dL (ref 0.2–1.0)
CREATININE: 0.95 mg/dL (ref 0.60–1.30)
Calcium, Total: 9.2 mg/dL (ref 8.5–10.1)
Chloride: 102 mmol/L (ref 98–107)
Co2: 33 mmol/L — ABNORMAL HIGH (ref 21–32)
EGFR (Non-African Amer.): >60
GLUCOSE: 99 mg/dL (ref 65–99)
OSMOLALITY: 275 (ref 275–301)
Potassium: 4.2 mmol/L (ref 3.5–5.1)
SGPT (ALT): 22 U/L (ref 12–78)
Sodium: 137 mmol/L (ref 136–145)
Total Protein: 7.4 g/dL (ref 6.4–8.2)

## 2013-05-01 LAB — CBC WITH DIFFERENTIAL/PLATELET
BASOS ABS: 0.1 10*3/uL (ref 0.0–0.1)
BASOS PCT: 0.9 %
Eosinophil #: 0.2 10*3/uL (ref 0.0–0.7)
Eosinophil %: 2.5 %
HCT: 41 % (ref 35.0–47.0)
HGB: 13.2 g/dL (ref 12.0–16.0)
Lymphocyte #: 1.2 10*3/uL (ref 1.0–3.6)
Lymphocyte %: 18.2 %
MCH: 26.8 pg (ref 26.0–34.0)
MCHC: 32.3 g/dL (ref 32.0–36.0)
MCV: 83 fL (ref 80–100)
MONO ABS: 0.6 x10 3/mm (ref 0.2–0.9)
Monocyte %: 9.2 %
NEUTROS ABS: 4.5 10*3/uL (ref 1.4–6.5)
Neutrophil %: 69.2 %
Platelet: 273 10*3/uL (ref 150–440)
RBC: 4.95 10*6/uL (ref 3.80–5.20)
RDW: 14.3 % (ref 11.5–14.5)
WBC: 6.6 10*3/uL (ref 3.6–11.0)

## 2013-05-01 LAB — URINALYSIS, COMPLETE
Bilirubin,UR: NEGATIVE
Glucose,UR: 50 mg/dL (ref 0–75)
KETONE: NEGATIVE
Leukocyte Esterase: NEGATIVE
NITRITE: NEGATIVE
Ph: 5 (ref 4.5–8.0)
SPECIFIC GRAVITY: 1.026 (ref 1.003–1.030)
Squamous Epithelial: 2
WBC UR: 1 /HPF (ref 0–5)

## 2013-05-01 LAB — LIPASE, BLOOD: LIPASE: 116 U/L (ref 73–393)

## 2013-05-02 ENCOUNTER — Observation Stay: Payer: Self-pay | Admitting: Internal Medicine

## 2013-05-02 LAB — CBC WITH DIFFERENTIAL/PLATELET
BASOS ABS: 0 10*3/uL (ref 0.0–0.1)
Basophil %: 0.7 %
EOS ABS: 0.2 10*3/uL (ref 0.0–0.7)
EOS PCT: 3.7 %
HCT: 36.3 % (ref 35.0–47.0)
HGB: 11.7 g/dL — AB (ref 12.0–16.0)
LYMPHS ABS: 1.5 10*3/uL (ref 1.0–3.6)
Lymphocyte %: 28.7 %
MCH: 26.9 pg (ref 26.0–34.0)
MCHC: 32.3 g/dL (ref 32.0–36.0)
MCV: 83 fL (ref 80–100)
Monocyte #: 0.5 x10 3/mm (ref 0.2–0.9)
Monocyte %: 9.8 %
NEUTROS PCT: 57.1 %
Neutrophil #: 3 10*3/uL (ref 1.4–6.5)
PLATELETS: 233 10*3/uL (ref 150–440)
RBC: 4.37 10*6/uL (ref 3.80–5.20)
RDW: 14.3 % (ref 11.5–14.5)
WBC: 5.3 10*3/uL (ref 3.6–11.0)

## 2013-05-02 LAB — BASIC METABOLIC PANEL
Anion Gap: 4 — ABNORMAL LOW (ref 7–16)
BUN: 10 mg/dL (ref 7–18)
Calcium, Total: 8 mg/dL — ABNORMAL LOW (ref 8.5–10.1)
Chloride: 108 mmol/L — ABNORMAL HIGH (ref 98–107)
Co2: 26 mmol/L (ref 21–32)
Creatinine: 0.64 mg/dL (ref 0.60–1.30)
EGFR (Non-African Amer.): >60
Glucose: 148 mg/dL — ABNORMAL HIGH (ref 65–99)
OSMOLALITY: 277 (ref 275–301)
Potassium: 3.5 mmol/L (ref 3.5–5.1)
Sodium: 138 mmol/L (ref 136–145)

## 2013-05-06 ENCOUNTER — Ambulatory Visit: Payer: Managed Care, Other (non HMO) | Admitting: Endocrinology

## 2013-05-16 ENCOUNTER — Other Ambulatory Visit (INDEPENDENT_AMBULATORY_CARE_PROVIDER_SITE_OTHER): Payer: Managed Care, Other (non HMO)

## 2013-05-16 ENCOUNTER — Ambulatory Visit (INDEPENDENT_AMBULATORY_CARE_PROVIDER_SITE_OTHER): Payer: Managed Care, Other (non HMO) | Admitting: Endocrinology

## 2013-05-16 ENCOUNTER — Encounter: Payer: Self-pay | Admitting: Endocrinology

## 2013-05-16 VITALS — BP 126/60 | HR 87 | Temp 97.8°F | Resp 16 | Ht 62.0 in | Wt 124.8 lb

## 2013-05-16 DIAGNOSIS — E1065 Type 1 diabetes mellitus with hyperglycemia: Secondary | ICD-10-CM

## 2013-05-16 DIAGNOSIS — IMO0002 Reserved for concepts with insufficient information to code with codable children: Secondary | ICD-10-CM

## 2013-05-16 DIAGNOSIS — I10 Essential (primary) hypertension: Secondary | ICD-10-CM

## 2013-05-16 LAB — BASIC METABOLIC PANEL
BUN: 16 mg/dL (ref 6–23)
CO2: 30 mEq/L (ref 19–32)
Calcium: 9.4 mg/dL (ref 8.4–10.5)
Chloride: 101 mEq/L (ref 96–112)
Creatinine, Ser: 0.7 mg/dL (ref 0.4–1.2)
GFR: 87.46 mL/min (ref >60.00)
Glucose, Bld: 163 mg/dL — ABNORMAL HIGH (ref 70–99)
POTASSIUM: 4 meq/L (ref 3.5–5.1)
SODIUM: 138 meq/L (ref 135–145)

## 2013-05-16 LAB — HEMOGLOBIN A1C: Hgb A1c MFr Bld: 9.4 % — ABNORMAL HIGH (ref 4.6–6.5)

## 2013-05-16 NOTE — Progress Notes (Signed)
Patient ID: Diana Dixon, female   DOB: 1960-06-02, 53 y.o.   MRN: [id number]   Reason for Appointment: Diabetes follow-up   History of Present Illness   Diagnosis: Type 1 DIABETES MELITUS, diagnosis made in 1967     Insulin regimen: Lantus 12 twice a day, NovoLog 8 units at breakfast and 6-8 units at lunch and supper  She has had long-standing type 1 diabetes with persistently poor control because of variability in blood sugars and difficulty with dietary compliance She has been reluctant to do carbohydrate counting to adjust her insulin doses and has variable readings after meals especially lunch Previously has had times when she would forget her Lantus in the evening and also mealtime dose She may have done a little better with taking Lantus twice a day but does not try to adjust the doses on her own   Recent history: FASTING blood sugars are again significantly high and the highest of the day but she does not follow instructions for increasing her Lantus that have been given at least once recently. She states that she does not want to take more insulin Postprandial readings: Usually not checking these consistently but they appear to be fairly good after breakfast, mostly good after lunch but not checked often after supper. After supper has a very high reading of a low reading; high reading may be related to missing her mealtime NovoLog. She does not give very good history about her day-to-day regimen, meals or reasons for variability in blood sugars Her A1c is consistently over 8% DIET: Her control is also compounded by the fact that she likes to drink various kinds of drinks with sugar added at each meal and also eating unbalanced meals like Pop tarts with juice for breakfast Other problems: Taking her Novolog after eating, usually within 5-10 minutes;, rarely forgetting to do this. Inconsistent glucose monitoring after meals    Glucometer: One Touch ultra 2  Monitors blood  glucose 2.4 times a day:         Blood Glucose readings from meter download:   PREMEAL Breakfast Lunch Dinner Bedtime Overall  Glucose range:  143-312   216-318  ?     Mean/median:  250      46-377    POST-MEAL PC Breakfast PC Lunch PC Dinner  Glucose range:  81-135   64-254   46-377   Mean/median:       Hypoglycemia frequency:  occasionally around 2 PM, lowest reading at 7:30 PM Meals: 3 meals per day. still eating Pop Tart in am with juice; dinner 6 pm         Physical activity: exercise:  trying to walk periodically          Wt Readings from Last 3 Encounters:  05/16/13 124 lb 12.8 oz (56.609 kg)  12/22/12 115 lb 11.2 oz (52.481 kg)  09/17/12 111 lb 9.6 oz (95.638 kg)   Complications: Retinopathy  Lab Results  Component Value Date   HGBA1C 9.4* 05/16/2013   HGBA1C 9.1* 12/22/2012   HGBA1C 9.2* 09/17/2012   Lab Results  Component Value Date   MICROALBUR 1.9 09/17/2012   CREATININE 0.7 05/16/2013    Lab Results  Component Value Date   MICROALBUR 1.9 09/17/2012      Medication List       This list is accurate as of: 05/16/13 11:59 PM.  Always use your most recent med list.  amLODipine-olmesartan 5-20 MG per tablet  Commonly known as:  AZOR  Take 1 tablet by mouth daily.     atorvastatin 10 MG tablet  Commonly known as:  LIPITOR  Take 1 tablet (10 mg total) by mouth daily.     clindamycin 1 % external solution  Commonly known as:  CLEOCIN T     clindamycin 300 MG capsule  Commonly known as:  CLEOCIN  Take 1 capsule (300 mg total) by mouth 4 (four) times daily.     Insulin Glargine 100 UNIT/ML Solostar Pen  Commonly known as:  LANTUS SOLOSTAR  Inject 12 Units into the skin 2 (two) times daily.     Insulin Pen Needle 32G X 4 MM Misc  Commonly known as:  BD PEN NEEDLE NANO U/F  Use to inject insulin as directed     NOVOLOG FLEXPEN 100 UNIT/ML FlexPen  Generic drug:  insulin aspart  Inject 8 Units into the skin 3 (three) times daily with meals.      nystatin 100000 UNIT/ML suspension  Commonly known as:  MYCOSTATIN  Take 5 mLs (500,000 Units total) by mouth 4 (four) times daily.     ONE TOUCH ULTRA TEST test strip  Generic drug:  glucose blood  1 each by Other route as needed. Test 7 times daily     pantoprazole 40 MG tablet  Commonly known as:  PROTONIX  Take 1 tablet (40 mg total) by mouth daily.     PARoxetine 12.5 MG 24 hr tablet  Commonly known as:  PAXIL-CR        Allergies:  Allergies  Allergen Reactions  . Penicillins   . Sulfa Antibiotics     Past Medical History  Diagnosis Date  . Depression   . Diabetes mellitus without complication     Type I    No past surgical history on file.  No family history on file.  Social History:  reports that she has never smoked. She does not have any smokeless tobacco history on file. She reports that she does not drink alcohol or use illicit drugs.  Review of Systems:  HYPERTENSION:  she has had long-standing hypertension treated with Azor. Does not check blood pressure at home  her dosage was reduced and she does not have any dizziness now  No history of hyperlipidemia  She does take Protonix for reflux/heartburn  Also has had mild long-standing depression controlled with Paxil     Examination:   BP 126/60  Pulse 87  Temp(Src) 97.8 F (36.6 C)  Resp 16  Ht 5\' 2"  (1.575 m)  Wt 124 lb 12.8 oz (56.609 kg)  BMI 22.82 kg/m2  SpO2 96%  Body mass index is 22.82 kg/(m^2).   ASSESSMENT/ PLAN:   Diabetes type 1   Her blood sugars have been poorly controlled with her A1c been consistently high and mostly over 9% She has very low motivation to be compliant  with instructions for her diet, insulin adjustment and timing of insulin This is despite instructions by nurse educator On her last visit she was again told to increase her evening Lantus since fasting readings are consistently high See history of present illness for detailed discussion of her current  regimen and blood sugar patterns  Problems identifiedToday:   Consistent hyperglycemia on waking up  Refusal of the patient to increase her evening Lantus despite instructions  Not checking blood sugars very much recently before and after dinner  Rarely skipping her insulin at meals and taking  it postprandially  Eating a high carbohydrate high glycemic index meal in the morning and no protein with tendency to low normal readings midmorning and in higher readings before lunch possibly from snacks  Not checking blood sugar right before supper to help do a correction   Occasional low readings after supper which she cannot explain  Drinking regular soft drinks consistently at each meal  Depression: She has been started on Paxil by her PCP but not clear if she is better with motivation level  Hypercholesterolemia: Will need to get followup levels on the next visit since starting Lipitor  PLAN:   More consistent glucose monitoring in the evening  She will consistently taking insulin right at mealtimes  Adjust mealtime dose based on carbohydrate intake and meal size  Increase Lantus to 14 units in the evening, discussed the need to keep morning sugar is below at least 140. She was explained that her blood sugars in the mornings a higher than the night before and she will not have hypoglycemia overnight  May need to increase her morning Lantus at suppertime readings are consistently high  HYPERTENSION: Her blood pressure is well-controlled without orthostatic symptoms  Counseling time over 50% of today's 25 minute visit  Sabiha Sura 05/17/2013, 11:33 AM     Appointment on 05/16/2013  Component Date Value Ref Range Status  . Hemoglobin A1C 05/16/2013 9.4* 4.6 - 6.5 % Final   Glycemic Control Guidelines for People with Diabetes:Non Diabetic:  <6%Goal of Therapy: <7%Additional Action Suggested:  >8%   . Sodium 05/16/2013 138  135 - 145 mEq/L Final  . Potassium 05/16/2013 4.0  3.5  - 5.1 mEq/L Final  . Chloride 05/16/2013 101  96 - 112 mEq/L Final  . CO2 05/16/2013 30  19 - 32 mEq/L Final  . Glucose, Bld 05/16/2013 163* 70 - 99 mg/dL Final  . BUN 05/16/2013 16  6 - 23 mg/dL Final  . Creatinine, Ser 05/16/2013 0.7  0.4 - 1.2 mg/dL Final  . Calcium 05/16/2013 9.4  8.4 - 10.5 mg/dL Final  . GFR 05/16/2013 87.46  >60.00 mL/min Final

## 2013-05-16 NOTE — Patient Instructions (Signed)
More sugars before and supper  Take Novolog as you finish eating  LANTUS 14 IN PM AND KEEP AM SUGAR BETWEEN  90-130

## 2013-05-24 ENCOUNTER — Other Ambulatory Visit: Payer: Self-pay | Admitting: *Deleted

## 2013-05-24 MED ORDER — INSULIN PEN NEEDLE 32G X 4 MM MISC: Status: DC

## 2013-06-01 ENCOUNTER — Other Ambulatory Visit: Payer: Self-pay | Admitting: *Deleted

## 2013-06-01 ENCOUNTER — Telehealth: Payer: Self-pay | Admitting: Endocrinology

## 2013-06-01 MED ORDER — INSULIN ASPART 100 UNIT/ML FLEXPEN: PEN_INJECTOR | SUBCUTANEOUS | Status: DC

## 2013-06-01 MED ORDER — INSULIN LISPRO 100 UNIT/ML (KWIKPEN): 8.0000 [IU] | PEN_INJECTOR | Freq: Three times a day (TID) | SUBCUTANEOUS | Status: DC

## 2013-06-01 NOTE — Telephone Encounter (Signed)
[employer] no longer pays for novolog Can we transfer to humalog

## 2013-06-01 NOTE — Telephone Encounter (Signed)
Rx for Humalog sent

## 2013-07-13 ENCOUNTER — Other Ambulatory Visit: Payer: Self-pay | Admitting: *Deleted

## 2013-08-15 ENCOUNTER — Encounter: Payer: Self-pay | Admitting: Endocrinology

## 2013-08-15 ENCOUNTER — Other Ambulatory Visit (INDEPENDENT_AMBULATORY_CARE_PROVIDER_SITE_OTHER): Payer: Managed Care, Other (non HMO)

## 2013-08-15 ENCOUNTER — Other Ambulatory Visit: Payer: Self-pay | Admitting: *Deleted

## 2013-08-15 ENCOUNTER — Ambulatory Visit (INDEPENDENT_AMBULATORY_CARE_PROVIDER_SITE_OTHER): Payer: Managed Care, Other (non HMO) | Admitting: Endocrinology

## 2013-08-15 VITALS — BP 126/60 | HR 91 | Temp 98.0°F | Resp 14 | Ht 62.0 in | Wt 125.6 lb

## 2013-08-15 DIAGNOSIS — J069 Acute upper respiratory infection, unspecified: Secondary | ICD-10-CM

## 2013-08-15 DIAGNOSIS — IMO0002 Reserved for concepts with insufficient information to code with codable children: Secondary | ICD-10-CM

## 2013-08-15 DIAGNOSIS — E1065 Type 1 diabetes mellitus with hyperglycemia: Secondary | ICD-10-CM

## 2013-08-15 DIAGNOSIS — E7800 Pure hypercholesterolemia, unspecified: Secondary | ICD-10-CM

## 2013-08-15 DIAGNOSIS — I10 Essential (primary) hypertension: Secondary | ICD-10-CM

## 2013-08-15 LAB — MICROALBUMIN / CREATININE URINE RATIO
CREATININE, U: 202.1 mg/dL
Microalb Creat Ratio: 9.7 mg/g (ref 0.0–30.0)
Microalb, Ur: 19.5 mg/dL — ABNORMAL HIGH (ref 0.0–1.9)

## 2013-08-15 LAB — COMPREHENSIVE METABOLIC PANEL
ALT: 23 U/L (ref 0–35)
AST: 21 U/L (ref 0–37)
Albumin: 4.4 g/dL (ref 3.5–5.2)
Alkaline Phosphatase: 83 U/L (ref 39–117)
BUN: 18 mg/dL (ref 6–23)
CALCIUM: 9.9 mg/dL (ref 8.4–10.5)
CHLORIDE: 101 meq/L (ref 96–112)
CO2: 28 mEq/L (ref 19–32)
CREATININE: 0.9 mg/dL (ref 0.4–1.2)
GFR: 67.12 mL/min (ref >60.00)
Glucose, Bld: 309 mg/dL — ABNORMAL HIGH (ref 70–99)
Potassium: 3.7 mEq/L (ref 3.5–5.1)
Sodium: 137 mEq/L (ref 135–145)
Total Bilirubin: 0.4 mg/dL (ref 0.2–1.2)
Total Protein: 7.3 g/dL (ref 6.0–8.3)

## 2013-08-15 LAB — LIPID PANEL
Cholesterol: 193 mg/dL (ref 0–200)
HDL: 68.4 mg/dL (ref >39.00)
LDL Cholesterol: 110 mg/dL — ABNORMAL HIGH (ref 0–99)
NONHDL: 124.6
Total CHOL/HDL Ratio: 3
Triglycerides: 71 mg/dL (ref 0.0–149.0)
VLDL: 14.2 mg/dL (ref 0.0–40.0)

## 2013-08-15 LAB — HEMOGLOBIN A1C: Hgb A1c MFr Bld: 9.4 % — ABNORMAL HIGH (ref 4.6–6.5)

## 2013-08-15 MED ORDER — PROMETHAZINE HCL 25 MG PO TABS: 25.0000 mg | ORAL_TABLET | Freq: Three times a day (TID) | ORAL | Status: DC | PRN

## 2013-08-15 NOTE — Progress Notes (Signed)
Patient ID: Diana Dixon, female   DOB: 1960/06/29, 53 y.o.   MRN: [id number]   Reason for Appointment: Diabetes follow-up   History of Present Illness   Diagnosis: Type 1 DIABETES MELITUS, diagnosis made in 1967     Insulin regimen: Lantus 13 twice a day, NovoLog 8 units at breakfast and 6-8 units at lunch and supper  She has had long-standing type 1 diabetes with persistently poor control because of variability in blood sugars and difficulty with various self-care issues She has been reluctant to do carbohydrate counting to adjust her insulin doses and has variable readings after meals especially lunch Previously has had times when she would forget her Lantus in the evening and also mealtime dose She may have done a little better with taking Lantus twice a day but does not try to adjust the doses on her own    Recent history: 8-8 today   FASTING blood sugars are again significantly high; she was told to take at least 14 units of Lantus in the evening and increase it further if morning sugars are still over 140 but she has taken only 13 units because she does not want to take more insulin Morning sugars are still averaging over 200; however has had sporadic readings below 100 including 65 at 5 AM Occasional low reading may be related to increased physical activity the day before Postprandial readings: Usually not checking these consistently and only after lunch and these are quite variable but still overall high Blood sugars after breakfast have been fairly good without any hypoglycemia Has only 2 or 3 readings after supper and these are fairly good Today her blood sugars have been markedly increased with lunchtime reading over 500, has had recent respiratory infection. Does not appear to be using a correction factor for high readings and did not increase her insulin when fasting reading was 346 Complaining of malaise and an episode of vomiting in the early afternoon today Her A1c is  consistently over 8% DIET: Her control is made difficult by the fact that she likes to drink various kinds of drinks with sugar added such as ice tea at each meal and also eating unbalanced meals like Pop tarts with juice for breakfast Other problems: Taking her Novolog after eating, usually within 5-10 minutes despite instructions before; rarely forgetting to do this.    Glucometer: One Touch ultra 2  Monitors: blood glucose 3.1 times a day; recently concerned about the cost of this        Blood Glucose readings from meter download:   PREMEAL Breakfast Lunch Dinner Bedtime Overall  Glucose range:  65-346   77-601   203,311   98-164    Mean/median:  237   186     195    POST-MEAL PC Breakfast PC Lunch PC Dinner  Glucose range:  67-161   99-438    Mean/median:  122  227      Hypoglycemia frequency:   minimal as above  Meals: 3 meals per day. still eating Pop Tart in am with juice; dinner 6 pm         Physical activity: exercise:  trying to walk periodically          Wt Readings from Last 3 Encounters:  08/15/13 125 lb 9.6 oz (56.972 kg)  05/16/13 124 lb 12.8 oz (56.609 kg)  12/22/12 115 lb 11.2 oz (42.683 kg)   Complications: Retinopathy  Lab Results  Component Value Date   HGBA1C 9.4* 08/15/2013  HGBA1C 9.4* 05/16/2013   HGBA1C 9.1* 12/22/2012   Lab Results  Component Value Date   MICROALBUR 19.5* 08/15/2013   LDLCALC 110* 08/15/2013   CREATININE 0.9 08/15/2013    Lab Results  Component Value Date   MICROALBUR 19.5* 08/15/2013      Medication List       This list is accurate as of: 08/15/13  8:54 PM.  Always use your most recent med list.               amLODipine-olmesartan 5-20 MG per tablet  Commonly known as:  AZOR  Take 1 tablet by mouth daily.     atorvastatin 10 MG tablet  Commonly known as:  LIPITOR  Take 1 tablet (10 mg total) by mouth daily.     insulin aspart 100 UNIT/ML FlexPen  Commonly known as:  NOVOLOG FLEXPEN  Inject 8 units three times a day with  meals, may use extra if sugars are high     Insulin Glargine 100 UNIT/ML Solostar Pen  Commonly known as:  LANTUS SOLOSTAR  Inject 12 Units into the skin 2 (two) times daily.     insulin lispro 100 UNIT/ML KiwkPen  Commonly known as:  HUMALOG KWIKPEN  Inject 0.08 mLs (8 Units total) into the skin 3 (three) times daily.     Insulin Pen Needle 32G X 4 MM Misc  Commonly known as:  BD PEN NEEDLE NANO U/F  Use to inject insulin as directed     nystatin 100000 UNIT/ML suspension  Commonly known as:  MYCOSTATIN  Take 5 mLs (500,000 Units total) by mouth 4 (four) times daily.     nystatin-triamcinolone cream  Commonly known as:  MYCOLOG II     ONE TOUCH ULTRA TEST test strip  Generic drug:  glucose blood  1 each by Other route as needed. Test 7 times daily     pantoprazole 40 MG tablet  Commonly known as:  PROTONIX  Take 1 tablet (40 mg total) by mouth daily.     PARoxetine 12.5 MG 24 hr tablet  Commonly known as:  PAXIL-CR     promethazine 25 MG tablet  Commonly known as:  PHENERGAN  Take 1 tablet (25 mg total) by mouth every 8 (eight) hours as needed for nausea or vomiting.        Allergies:  Allergies  Allergen Reactions  . Penicillins   . Sulfa Antibiotics     Past Medical History  Diagnosis Date  . Depression   . Diabetes mellitus without complication     Type I    No past surgical history on file.  No family history on file.  Social History:  reports that she has never smoked. She does not have any smokeless tobacco history on file. She reports that she does not drink alcohol or use illicit drugs.  Review of Systems:  URI: Since last Thursday she has had a mostly dry cough and some nasal congestion. Has not taken any medications OTC. No fever and no purulent sputum or nasal discharge, no sinus headaches; only had sore throat in the beginning   HYPERTENSION:  she has had long-standing hypertension treated with Azor. Does not check blood pressure at  home Blood pressure well controlled   Has history of hyperlipidemia but did not take Lipitor as prescribed even though she had taken a few years ago without side effects. Reluctant to take medications for fear of side effects   Lab Results  Component Value Date  CHOL 193 08/15/2013   HDL 68.40 08/15/2013   LDLCALC 110* 08/15/2013   LDLDIRECT 130.7 12/22/2012   TRIG 71.0 08/15/2013   CHOLHDL 3 08/15/2013    She does take Protonix for reflux/heartburn  Also has had mild long-standing depression controlled with Paxil     Examination:   BP 126/60  Pulse 91  Temp(Src) 98 F (36.7 C)  Resp 14  Ht 5\' 2"  (1.575 m)  Wt 125 lb 9.6 oz (56.972 kg)  BMI 22.97 kg/m2  SpO2 96%  Body mass index is 22.97 kg/(m^2).   ASSESSMENT/ PLAN:   Diabetes type 1   Her blood sugars have been poorly controlled with her A1c been consistently high and mostly over 9% She has very low motivation to be compliant  with instructions for her diet, insulin adjustment and timing of insulin This is despite instructions by nurse educator She has been repeatedly  told to increase her evening Lantus since fasting readings are consistently high but does not do so See history of present illness for detailed discussion of her current regimen and blood sugar patterns  Today has had some vomiting and may be in early ketoacidosis, discussed management  Problems identified :   Consistent hyperglycemia on waking up  Refusal of the patient to increase her evening Lantus despite instructions  Not checking blood sugars  usually before and after dinner   Taking Humalog postprandially  Eating a high carbohydrate high glycemic index meal in the morning and no protein   Not checking blood sugar right before supper to help do a correction or adjust the dose of morning Lantus    Drinking regular soft drinks consistently at each meal  Not aware of a correction factor to correct high blood sugars and not consistently increasing  Humalog when needed  Depression: She has been started on Paxil by her PCP but not clear if she is better with motivation level  Hypercholesterolemia: Will need to get followup levels today and she will agree to take a statin drug if consistently high LDL  URI: There is no addition of a bacterial infection and discussed OTC medications for symptomatic relief  PLAN:    More consistent glucose monitoring before and after supper  She will start taking her Humalog insulin right before eating instead of 5-10 minutes after  Adjust mealtime dose based on carbohydrate intake and meal size  Increase Lantus to 15 units in the evening, discussed the need to keep morning sugar under 140. She can also have a small snack at bedtime to prevent overnight hypoglycemia. If very active in the evening she can reduce her Lantus to 13 units  May need to increase her morning Lantus if her suppertime readings are consistently high  Given her a correction factor 1: 40 to use for high blood sugars  Avoid drinks with sugar especially when blood sugars are running high  HYPERTENSION: Her blood pressure is well-controlled without orthostatic symptoms  Counseling time over 50% of today's 25 minute visit  Dareld Mcauliffe 08/15/2013, 8:54 PM   A1c 9.4, LDL 110, needs treatment  Appointment on 08/15/2013  Component Date Value Ref Range Status  . Hemoglobin A1C 08/15/2013 9.4* 4.6 - 6.5 % Final   Glycemic Control Guidelines for People with Diabetes:Non Diabetic:  <6%Goal of Therapy: <7%Additional Action Suggested:  >8%   . Sodium 08/15/2013 137  135 - 145 mEq/L Final  . Potassium 08/15/2013 3.7  3.5 - 5.1 mEq/L Final  . Chloride 08/15/2013 101  96 -  112 mEq/L Final  . CO2 08/15/2013 28  19 - 32 mEq/L Final  . Glucose, Bld 08/15/2013 309* 70 - 99 mg/dL Final  . BUN 08/15/2013 18  6 - 23 mg/dL Final  . Creatinine, Ser 08/15/2013 0.9  0.4 - 1.2 mg/dL Final  . Total Bilirubin 08/15/2013 0.4  0.2 - 1.2 mg/dL Final  .  Alkaline Phosphatase 08/15/2013 83  39 - 117 U/L Final  . AST 08/15/2013 21  0 - 37 U/L Final  . ALT 08/15/2013 23  0 - 35 U/L Final  . Total Protein 08/15/2013 7.3  6.0 - 8.3 g/dL Final  . Albumin 08/15/2013 4.4  3.5 - 5.2 g/dL Final  . Calcium 08/15/2013 9.9  8.4 - 10.5 mg/dL Final  . GFR 08/15/2013 67.12  >60.00 mL/min Final  . Cholesterol 08/15/2013 193  0 - 200 mg/dL Final   ATP III Classification       Desirable:  < 200 mg/dL               Borderline High:  200 - 239 mg/dL          High:  > = 240 mg/dL  . Triglycerides 08/15/2013 71.0  0.0 - 149.0 mg/dL Final   Normal:  <150 mg/dLBorderline High:  150 - 199 mg/dL  . HDL 08/15/2013 68.40  >39.00 mg/dL Final  . VLDL 08/15/2013 14.2  0.0 - 40.0 mg/dL Final  . LDL Cholesterol 08/15/2013 110* 0 - 99 mg/dL Final  . Total CHOL/HDL Ratio 08/15/2013 3   Final                  Men          Women1/2 Average Risk     3.4          3.3Average Risk          5.0          4.42X Average Risk          9.6          7.13X Average Risk          15.0          11.0                      . NonHDL 08/15/2013 124.60   Final  . Microalb, Ur 08/15/2013 19.5* 0.0 - 1.9 mg/dL Final  . Creatinine,U 08/15/2013 202.1   Final  . Microalb Creat Ratio 08/15/2013 9.7  0.0 - 30.0 mg/g Final

## 2013-08-15 NOTE — Progress Notes (Signed)
Quick Note:  Please let patient know that the A1c is still high at 9.4. Cholesterol is higher than target, she can either take Lipitor or pravastatin 20 mg daily, needs prescription, need results of urinalysis done today ______

## 2013-08-15 NOTE — Patient Instructions (Addendum)
Mucinex DM and nasal spray  Lantus 15 units in pm and some protein snack at night  Extra 6-8 units more for sugars over 300 Otherwise  take extra 1 unit for every 40 pts over 100  Rotate times of checking sugar

## 2013-08-22 ENCOUNTER — Other Ambulatory Visit: Payer: Self-pay | Admitting: *Deleted

## 2013-08-22 ENCOUNTER — Telehealth: Payer: Self-pay | Admitting: Endocrinology

## 2013-08-22 MED ORDER — PRAVASTATIN SODIUM 20 MG PO TABS: 20.0000 mg | ORAL_TABLET | Freq: Every day | ORAL | Status: DC

## 2013-08-22 NOTE — Telephone Encounter (Signed)
Pt returning call for labs 

## 2013-08-22 NOTE — Telephone Encounter (Signed)
Please call back pt with lab results

## 2013-09-06 ENCOUNTER — Other Ambulatory Visit: Payer: Self-pay | Admitting: *Deleted

## 2013-09-06 MED ORDER — GLUCOSE BLOOD VI STRP: ORAL_STRIP | Status: DC

## 2013-09-09 ENCOUNTER — Ambulatory Visit: Payer: Self-pay | Admitting: Obstetrics and Gynecology

## 2013-11-15 ENCOUNTER — Ambulatory Visit: Payer: Managed Care, Other (non HMO) | Admitting: Endocrinology

## 2013-11-15 ENCOUNTER — Other Ambulatory Visit: Payer: Managed Care, Other (non HMO)

## 2013-12-22 ENCOUNTER — Telehealth: Payer: Self-pay | Admitting: *Deleted

## 2013-12-22 ENCOUNTER — Other Ambulatory Visit: Payer: Self-pay | Admitting: *Deleted

## 2013-12-22 ENCOUNTER — Ambulatory Visit (INDEPENDENT_AMBULATORY_CARE_PROVIDER_SITE_OTHER): Payer: Managed Care, Other (non HMO) | Admitting: Endocrinology

## 2013-12-22 ENCOUNTER — Encounter: Payer: Self-pay | Admitting: Endocrinology

## 2013-12-22 VITALS — BP 132/58 | HR 81 | Temp 97.9°F | Resp 14 | Ht 62.0 in | Wt 135.8 lb

## 2013-12-22 DIAGNOSIS — E1065 Type 1 diabetes mellitus with hyperglycemia: Secondary | ICD-10-CM

## 2013-12-22 DIAGNOSIS — R5383 Other fatigue: Secondary | ICD-10-CM

## 2013-12-22 DIAGNOSIS — E7800 Pure hypercholesterolemia, unspecified: Secondary | ICD-10-CM

## 2013-12-22 DIAGNOSIS — IMO0002 Reserved for concepts with insufficient information to code with codable children: Secondary | ICD-10-CM

## 2013-12-22 MED ORDER — INSULIN LISPRO 100 UNIT/ML (KWIKPEN): 8.0000 [IU] | PEN_INJECTOR | Freq: Three times a day (TID) | SUBCUTANEOUS | Status: DC

## 2013-12-22 MED ORDER — PANTOPRAZOLE SODIUM 40 MG PO TBEC: 40.0000 mg | DELAYED_RELEASE_TABLET | Freq: Every day | ORAL | Status: DC

## 2013-12-22 NOTE — Patient Instructions (Addendum)
Lantus 12 in am and 18 in pm from Thursday  Reduce Carbs and sugar  Take Humalog just before eating

## 2013-12-22 NOTE — Progress Notes (Signed)
Patient ID: Diana Dixon, female   DOB: May 13, 1960, 53 y.o.   MRN: [id number]   Reason for Appointment: Diabetes follow-up   History of Present Illness   Diagnosis: Type 1 DIABETES MELITUS, diagnosis made in 1967     Insulin regimen: Lantus 13 twice a day, NovoLog 8 units at breakfast and 6-8 units at lunch and supper  She has had long-standing type 1 diabetes with persistently poor control because of variability in blood sugars and difficulty with various self-care issues She has been reluctant to do carbohydrate counting to adjust her insulin doses and has variable readings after meals especially lunch Previously has had times when she would forget her Lantus in the evening and also mealtime dose She may have done a little better with taking Lantus twice a day but does not try to adjust the doses on her own    Recent history: She has had persistently poor control of her diabetes despite frequently advising her on insulin changes and improving her diet  FASTING blood sugars are most difficult to control and usually are again significantly high Despite increasing her Lantus to 14 units twice a day she still has morning blood sugars averaging about 240 as before She thinks that her morning sugars are high because of late night snack but even with taking 6 units dose of Humalog for her evening snack last night her fasting reading was 303 today Occasional low reading in the morning is difficult to explain  Usually does not check readings before supper Postprandial readings:   She usually has higher readings after breakfast but again these are variable, does not take correction doses consistently for high morning readings  Blood sugars after lunch are relatively lower including 2 readings below 70  Blood sugars after supper are extremely inconsistent but mostly high, she does not know how to explain readings such as 404 last Monday Her A1c is consistently over 8%  DIET: Her control  is made difficult by the fact that she likes to use drinks with sugar added such as ice tea at each meal and also eating unbalanced meals like Pop tarts with a sweet drink for breakfast Other problems: Taking her Humalog after eating, usually within 5-10 minutes despite instructions before; rarely forgetting to take the shot    Glucometer: One Touch ultra 2  Monitors: blood glucose 3.1 times a day; recently concerned about the cost of this        Blood Glucose readings from meter download:   PREMEAL Breakfast Lunch Dinner Bedtime Overall  Glucose range: 60-366 52-243 165 69-454   median: 245 143  190 165    Hypoglycemia frequency:  about 2-3 times a week, lowest reading 52 in the afternoon  Meals: 3 meals per day.  eating Pop Tart in am with juice; dinner 6 pm         Physical activity: exercise:  trying to walk regularly         Wt Readings from Last 3 Encounters:  12/22/13 135 lb 12.8 oz (61.598 kg)  08/15/13 125 lb 9.6 oz (56.972 kg)  05/16/13 124 lb 12.8 oz (62.694 kg)   Complications: Retinopathy  Lab Results  Component Value Date   HGBA1C 9.4* 08/15/2013   HGBA1C 9.4* 05/16/2013   HGBA1C 9.1* 12/22/2012   Lab Results  Component Value Date   MICROALBUR 19.5* 08/15/2013   LDLCALC 110* 08/15/2013   CREATININE 0.9 08/15/2013    Lab Results  Component Value Date   MICROALBUR  19.5* 08/15/2013      Medication List       This list is accurate as of: 12/22/13  3:56 PM.  Always use your most recent med list.               amLODipine-olmesartan 5-20 MG per tablet  Commonly known as:  AZOR  Take 1 tablet by mouth daily.     atorvastatin 10 MG tablet  Commonly known as:  LIPITOR  Take 1 tablet (10 mg total) by mouth daily.     glucose blood test strip  Commonly known as:  ONE TOUCH ULTRA TEST  Test 7 times daily dx code 250.03     Insulin Glargine 100 UNIT/ML Solostar Pen  Commonly known as:  LANTUS SOLOSTAR  Inject 12 Units into the skin 2 (two) times  daily.     insulin lispro 100 UNIT/ML KiwkPen  Commonly known as:  HUMALOG KWIKPEN  Inject 0.08 mLs (8 Units total) into the skin 3 (three) times daily.     Insulin Pen Needle 32G X 4 MM Misc  Commonly known as:  BD PEN NEEDLE NANO U/F  Use to inject insulin as directed     nystatin 100000 UNIT/ML suspension  Commonly known as:  MYCOSTATIN  Take 5 mLs (500,000 Units total) by mouth 4 (four) times daily.     nystatin-triamcinolone cream  Commonly known as:  MYCOLOG II     pantoprazole 40 MG tablet  Commonly known as:  PROTONIX  Take 1 tablet (40 mg total) by mouth daily.     PARoxetine 12.5 MG 24 hr tablet  Commonly known as:  PAXIL-CR     pravastatin 20 MG tablet  Commonly known as:  PRAVACHOL  Take 1 tablet (20 mg total) by mouth daily.     promethazine 25 MG tablet  Commonly known as:  PHENERGAN  Take 1 tablet (25 mg total) by mouth every 8 (eight) hours as needed for nausea or vomiting.        Allergies:  Allergies  Allergen Reactions  . Penicillins   . Sulfa Antibiotics     Past Medical History  Diagnosis Date  . Depression   . Diabetes mellitus without complication     Type I    No past surgical history on file.  No family history on file.  Social History:  reports that she has never smoked. She does not have any smokeless tobacco history on file. She reports that she does not drink alcohol or use illicit drugs.  Review of Systems:  HYPERTENSION:  she has had long-standing hypertension treated with Azor. Does not check blood pressure at home Blood pressure well controlled recently  Has history of hyperlipidemia  And has started taking pravastatin 20 mg a low still reluctant to continue this No side effects with this   Lab Results  Component Value Date   CHOL 193 08/15/2013   HDL 68.40 08/15/2013   LDLCALC 110* 08/15/2013   LDLDIRECT 130.7 12/22/2012   TRIG 71.0 08/15/2013   CHOLHDL 3 08/15/2013    She does take Protonix for  reflux/heartburn  Also has had mild long-standing depression controlled with Paxil  Tired and sleepy more recently, has not had any thyroid levels recently  No results found for: FREET4, TSH      Examination:   BP 132/58 mmHg  Pulse 81  Temp(Src) 97.9 F (36.6 C)  Resp 14  Ht 5\' 2"  (1.575 m)  Wt 135 lb 12.8 oz (61.598 kg)  BMI 24.83 kg/m2  SpO2 95%  Body mass index is 24.83 kg/(m^2).   Thyroid nonpalpable Biceps reflexes normal No pedal edema  ASSESSMENT/ PLAN:   Diabetes type 1   Her blood sugars have been poorly controlled with her A1c been consistently high and mostly over 9% She has low motivation to be compliant  with instructions for her diet, insulin adjustment As before Although she has finally increase her Lantus to 14 units she has significantly increased fasting readings possibly reflecting dawn phenomenon; however does appear to require greater amount of basal insulin in the overnight periods compared to the daytime Her insurance does not cover [employer] at present and she is concerned about the cost of her insulin She refuses an insulin pump again partly because of cost  Problems identified :   Persistent hyperglycemia on waking up although there are occasional readings as low as 60  Not checking blood sugars  usually before and after dinner   Taking Humalog postprandially  Takes only fixed doses of Humalog and is not adjusted based on blood sugar level are meal size/carbohydrate intake  Gaining weight because of high carbohydrate intake and no consistent exercise  Hypercholesterolemia: Will need to get followup levels today with starting pravastatin   PLAN:    More consistent glucose monitoring before and after all the meals  She will start taking her Humalog insulin right before eating instead of 5-10 minutes after when she knows how much she will eat  Adjust mealtime dose based on carbohydrate intake and pre-meal blood sugar  Increase Lantus to  18 units in the evening, discussed the need to  Adjust this further if needed to keep morning sugar under 140. She can reduce morning dose to 12 units  Reduce carbohydrate intake overall to reduce weight gain  Avoid drinks with sugar especially when blood sugars are running high  Coupon given for Humalog pens  Check thyroid levels for fatigue  HYPERTENSION: Her blood pressure is well-controlled   Counseling time over 50% of today's 25 minute visit  Diana Dixon 12/22/2013, 3:56 PM

## 2013-12-23 LAB — GLUCOSE, RANDOM: GLUCOSE: 78 mg/dL (ref 70–99)

## 2013-12-23 LAB — HEMOGLOBIN A1C: Hgb A1c MFr Bld: 9.1 % — ABNORMAL HIGH (ref 4.6–6.5)

## 2013-12-23 LAB — TSH: TSH: 0.69 u[IU]/mL (ref 0.35–4.50)

## 2013-12-23 LAB — LDL CHOLESTEROL, DIRECT: LDL DIRECT: 88.8 mg/dL

## 2013-12-23 NOTE — Progress Notes (Signed)
Quick Note:  A1c 9.1, cholesterol improved and needs to continue pravastatin, thyroid level okay ______

## 2013-12-26 ENCOUNTER — Other Ambulatory Visit: Payer: Self-pay | Admitting: *Deleted

## 2013-12-26 MED ORDER — PRAVASTATIN SODIUM 20 MG PO TABS: 20.0000 mg | ORAL_TABLET | Freq: Every day | ORAL | Status: DC

## 2013-12-28 ENCOUNTER — Telehealth: Payer: Self-pay | Admitting: Endocrinology

## 2013-12-28 NOTE — Telephone Encounter (Signed)
Please see result note 

## 2013-12-28 NOTE — Telephone Encounter (Signed)
Patient is call for the results of her lab work

## 2013-12-28 NOTE — Telephone Encounter (Signed)
Please see below and advise.

## 2014-02-02 ENCOUNTER — Other Ambulatory Visit: Payer: Self-pay | Admitting: Endocrinology

## 2014-02-08 ENCOUNTER — Other Ambulatory Visit: Payer: Self-pay | Admitting: Endocrinology

## 2014-02-09 ENCOUNTER — Other Ambulatory Visit: Payer: Self-pay | Admitting: *Deleted

## 2014-02-09 MED ORDER — INSULIN PEN NEEDLE 32G X 4 MM MISC: Status: DC

## 2014-03-21 ENCOUNTER — Other Ambulatory Visit: Payer: Managed Care, Other (non HMO)

## 2014-03-24 ENCOUNTER — Other Ambulatory Visit: Payer: Self-pay | Admitting: *Deleted

## 2014-03-24 ENCOUNTER — Telehealth: Payer: Self-pay | Admitting: Endocrinology

## 2014-03-24 ENCOUNTER — Encounter: Payer: Self-pay | Admitting: Endocrinology

## 2014-03-24 ENCOUNTER — Ambulatory Visit (INDEPENDENT_AMBULATORY_CARE_PROVIDER_SITE_OTHER): Payer: Managed Care, Other (non HMO) | Admitting: Endocrinology

## 2014-03-24 VITALS — BP 124/50 | HR 83 | Temp 98.0°F | Resp 14 | Ht 62.0 in | Wt 142.0 lb

## 2014-03-24 DIAGNOSIS — IMO0002 Reserved for concepts with insufficient information to code with codable children: Secondary | ICD-10-CM

## 2014-03-24 DIAGNOSIS — I10 Essential (primary) hypertension: Secondary | ICD-10-CM

## 2014-03-24 DIAGNOSIS — E1065 Type 1 diabetes mellitus with hyperglycemia: Secondary | ICD-10-CM

## 2014-03-24 LAB — HEMOGLOBIN A1C: Hgb A1c MFr Bld: 9.8 % — ABNORMAL HIGH (ref 4.6–6.5)

## 2014-03-24 LAB — BASIC METABOLIC PANEL
BUN: 20 mg/dL (ref 6–23)
CO2: 32 mEq/L (ref 19–32)
Calcium: 9.7 mg/dL (ref 8.4–10.5)
Chloride: 104 mEq/L (ref 96–112)
Creatinine, Ser: 1 mg/dL (ref 0.40–1.20)
GFR: 61.58 mL/min (ref >60.00)
Glucose, Bld: 92 mg/dL (ref 70–99)
POTASSIUM: 4.4 meq/L (ref 3.5–5.1)
Sodium: 139 mEq/L (ref 135–145)

## 2014-03-24 MED ORDER — PRAVASTATIN SODIUM 20 MG PO TABS: 20.0000 mg | ORAL_TABLET | Freq: Every day | ORAL | Status: DC

## 2014-03-24 NOTE — Patient Instructions (Signed)
Increase Humalog to 11-12 at supper; Lantus 20 units or more at night  Check sugar at supper more  Reduce Humalog in am by 2

## 2014-03-24 NOTE — Telephone Encounter (Signed)
rx sent

## 2014-03-24 NOTE — Telephone Encounter (Signed)
Patient need refill of Pravastain sodium.

## 2014-03-24 NOTE — Progress Notes (Signed)
Patient ID: Diana Dixon, female   DOB: 17-Oct-1960, 54 y.o.   MRN: [id number]   Reason for Appointment: Diabetes follow-up   History of Present Illness   Diagnosis: Type 1 DIABETES MELITUS, diagnosis made in 1967     Insulin regimen: Lantus 12---18, HUmalog 8 units at breakfast and 6-8 units at lunch and supper  She has had long-standing type 1 diabetes with persistently poor control because of variability in blood sugars and difficulty with various self-care issues She has been reluctant to do carbohydrate counting to adjust her insulin doses and has variable readings after meals especially lunch Previously has had times when she would forget her Lantus in the evening and also mealtime dose She may have done a little better with taking Lantus twice a day but does not try to adjust the doses on her own    Recent history:  She has had persistently poor control of her diabetes despite frequently advising her on insulin changes and better compliance for day-to-day management of diet, insulin regimen and glucose monitoring  FASTING blood sugars are still appearing difficult to control even though she did go ahead and increase her evening Lantus as directed. She is however not willing to increase the insulin further on her own despite persistent high readings in the mornings. She did not bring her monitor for download today and difficult to assess exact blood sugar patterns Current problems identified and blood sugar patterns  She thinks her fasting blood sugars are still mostly over 200 but they may be better on some days possibly because of taking extra Humalog for postprandial hyperglycemia at night.  She frequently has high readings after supper including over 300 last night.  She still does not adjust her suppertime coverage despite high readings at night.  She is not taking extra insulin for high readings before meals  Usually does not check readings before  supper.  She has tendency to low normal readings around lunchtime probably because of eating low carbohydrate meals in the morning recently  No significant hypoglycemia  She still continues to have drinks with sugar, sometimes with all 3 meals.  She will take her Humalog about 5-10 minutes after eating rather than before despite reminders to change her timing  Her A1c is consistently over 8% and recently over 9% consistently  DIET: Her control is made difficult by the fact that she likes to use drinks with sugar added such as ice tea at each meal and also eating unbalanced meals like Pop tarts with a sweet drink for breakfast Other problems: Stress    Glucometer: One Touch ultra 2          Blood Glucose readings from recall   PRE-MEAL Breakfast Lunch Dinner Bedtime Overall  Glucose range: 82-200+ 70-90  200-346   Mean/median:         Meals: 3 meals per day.  Eating eggs/meat for breakfast frequently without carbohydrate; dinner 6 pm         Physical activity: exercise:  trying to walk regularly         Wt Readings from Last 3 Encounters:  03/24/14 142 lb (64.411 kg)  12/22/13 135 lb 12.8 oz (61.598 kg)  08/15/13 125 lb 9.6 oz (38.182 kg)   Complications: Retinopathy  Lab Results  Component Value Date   HGBA1C 9.1* 12/22/2013   HGBA1C 9.4* 08/15/2013   HGBA1C 9.4* 05/16/2013   Lab Results  Component Value Date   MICROALBUR 19.5* 08/15/2013  LDLCALC 110* 08/15/2013   CREATININE 0.9 08/15/2013    Lab Results  Component Value Date   MICROALBUR 19.5* 08/15/2013      Medication List       This list is accurate as of: 03/24/14  1:45 PM.  Always use your most recent med list.               amLODipine-olmesartan 5-20 MG per tablet  Commonly known as:  AZOR  Take 1 tablet by mouth daily.     glucose blood test strip  Commonly known as:  ONE TOUCH ULTRA TEST  Test 7 times daily dx code 250.03     HUMALOG KWIKPEN 100 UNIT/ML KiwkPen  Generic drug:  insulin  lispro  INJECT 8 UNITS SUBCUTANEOUSLY THREE TIMES DAILY     Insulin Pen Needle 32G X 4 MM Misc  Commonly known as:  BD PEN NEEDLE NANO U/F  Use to inject insulin as directed     LANTUS SOLOSTAR 100 UNIT/ML Solostar Pen  Generic drug:  Insulin Glargine  INJECT 12 UNITS SUBCUTANEOUSLY TWICE DAILY     nystatin 100000 UNIT/ML suspension  Commonly known as:  MYCOSTATIN  Take 5 mLs (500,000 Units total) by mouth 4 (four) times daily.     nystatin-triamcinolone cream  Commonly known as:  MYCOLOG II     pantoprazole 40 MG tablet  Commonly known as:  PROTONIX  Take 1 tablet (40 mg total) by mouth daily.     PARoxetine 12.5 MG 24 hr tablet  Commonly known as:  PAXIL-CR     pravastatin 20 MG tablet  Commonly known as:  PRAVACHOL  Take 1 tablet (20 mg total) by mouth daily.     promethazine 25 MG tablet  Commonly known as:  PHENERGAN  Take 1 tablet (25 mg total) by mouth every 8 (eight) hours as needed for nausea or vomiting.        Allergies:  Allergies  Allergen Reactions  . Penicillins   . Sulfa Antibiotics     Past Medical History  Diagnosis Date  . Depression   . Diabetes mellitus without complication     Type I    No past surgical history on file.  Family History  Problem Relation Age of Onset  . Thyroid disease Mother     Social History:  reports that she has never smoked. She does not have any smokeless tobacco history on file. She reports that she does not drink alcohol or use illicit drugs.  Review of Systems:  HYPERTENSION:  she has had long-standing hypertension treated with Azor. Does not check blood pressure at home Blood pressure well controlled recently, no lightheadedness  Has history of hyperlipidemia   She is taking pravastatin 20 mg   No side effects with this   Lab Results  Component Value Date   CHOL 193 08/15/2013   HDL 68.40 08/15/2013   LDLCALC 110* 08/15/2013   LDLDIRECT 88.8 12/22/2013   TRIG 71.0 08/15/2013   CHOLHDL 3  08/15/2013    She does take Protonix for reflux/heartburn  She has had mild long-standing depression controlled with Paxil  Thyroid level was checked previously because of complaints of tiredness and sleepiness   Lab Results  Component Value Date   TSH 0.69 12/22/2013        Examination:   BP 122/49 mmHg  Pulse 83  Temp(Src) 98 F (36.7 C)  Resp 14  Ht 5\' 2"  (1.575 m)  Wt 142 lb (64.411 kg)  BMI  25.97 kg/m2  SpO2 97%  Body mass index is 25.97 kg/(m^2).    ASSESSMENT/ PLAN:   Diabetes type 1   Her blood sugars have been poorly controlled with her A1c recently over 9% consistently See history of present illness for current problems identified in blood sugar patterns and her current management. She does appear to be having consistently high fasting readings despite increasing her evening Lantus This is partly related to her having high postprandial readings after supper, sometimes over 300. Only when she takes as much as 7 units of Humalog at bedtime she will have good readings in the mornings. She does appear to need more insulin coverage at suppertime and also higher doses of Lantus in the evening She refuses an insulin pump but after discussion about the V-go pump she is interested in trying this. This was discussed as far as the mode of usage and how this will be used for basal and bolus insulin delivery   PLAN:    More consistent glucose monitoring before supper time to help adjust the pre-meal blood insulin dose and also consider increasing lunchtime dose of blood sugar at suppertime is high  Discussed postprandial blood sugar targets including at night and how to adjust her suppertime dose further if needed  Start increasing her Lantus insulin until fasting blood sugar target achieved, to increase to 20 units now  She will discuss the V-go pump with the nurse educator and can try this for a week if cost not a factor, brochure given.  Reduce a.m. Humalog to  avoid low sugars by lunch time  More consistent balanced meals and avoid high glycemic index foods and drinks  HYPERTENSION: Her blood pressure is well-controlled and standing blood pressure is normal today  Patient Instructions  Increase Humalog to 11-12 at supper; Lantus 20 units or more at night  Check sugar at supper more  Reduce Humalog in am by 2     Counseling time over 50% of today's 25 minute visit  Blaire Palomino 03/24/2014, 1:45 PM

## 2014-03-26 NOTE — Progress Notes (Signed)
Quick Note:  Please let patient know that the A1c is higher at 9.8, has she decided on the V-go pump?  ______

## 2014-05-26 ENCOUNTER — Encounter: Payer: Self-pay | Admitting: *Deleted

## 2014-05-26 LAB — HM DIABETES EYE EXAM

## 2014-06-02 NOTE — Consult Note (Signed)
PATIENT NAME:  Diana Dixon, GLASSCO MR#:  [id number] DATE OF BIRTH:  1960/10/23  DATE OF CONSULTATION:  10/27/2012  CONSULTING PHYSICIAN:  Bowen Kia H. Posey Pronto, MD  PRIMARY CARE PROVIDER: Dr. Dwyane Dee   REFERRING PHYSICIAN:  Dr. Pat Patrick   REASON FOR CONSULTATION:  Medical management of patient's diabetes.   HISTORY OF PRESENT ILLNESS: The patient is a 54 year old white female who was earlier admitted by Dr. Pat Patrick for abdominal pain, nausea, vomiting and diarrhea, who is thought to have a possible small bowel obstruction and is admitted by Surgery for further evaluation and treatment. The patient reports that the abdominal pain is now resolved. Her nausea, vomiting and diarrhea is also resolved. She denies any fevers, chills. No chest pain. No shortness of breath. The patient does report that she is a type I diabetic and takes insulin on a daily basis.   PAST MEDICAL HISTORY: Significant for:  1.  Type I diabetes.  2.  Hypertension.  3.  Depression.  4.  GER.    ALLERGIES: PENICILLIN AND SULFA.   CURRENT HOME MEDICATIONS. She is on Azor 5/20, one tab p.o. daily, Lantus 10 units b.i.d., NovoLog subcutaneous sliding scale, Protonix 40 daily, paroxetine 12.5 mg at bedtime.   SOCIAL HISTORY: She works as an Curator. Does not smoke or drink. Denies any drug use.   FAMILY HISTORY: Positive for hypertension.   REVIEW OF SYSTEMS:  CONSTITUTIONAL: Denies any fevers, chills. No weight loss. No weight gain.  HEENT: Denies any visual difficulties, blurred vision. No cataracts. No glaucoma. No difficulty with swallowing.  CARDIOVASCULAR: Denies any chest pain, palpitations, shortness of breath. No syncope.  PULMONARY: Denies any cough, wheezing, hemoptysis. No COPD, no TB.  GASTROINTESTINAL: Had earlier, nausea, vomiting and diarrhea which are all improved. She reports previous history of pancreatitis which she relates to diabetes.  GENITOURINARY: Denies any frequency, urgency or hesitancy.  GYNECOLOGIC:  Denies any breast mass or any vaginal discharge.  MUSCULOSKELETAL: Denies any erythema or swelling.  SKIN: Denies any rash.  LYMPHATIC: Denies any lymph node enlargements.  NEUROLOGICAL: Denies any CVA, TIA or seizures.  PSYCHIATRIC: Does have depression.  HEMATOLOGIC: Denies anemia, easy bruisability or bleeding.   PHYSICAL EXAMINATION: VITAL SIGNS: Temperature 97.9, pulse 75, respirations 18, blood pressure 135/70, O2 97%.   LABORATORY DATA: Urinalysis was negative. CT of the abdomen and pelvis with contrast showed findings consistent with mechanical small bowel obstruction with transient point in the lower pelvis, multiple renal cysts. Urinalysis is nitrite negative, leukocytes negative. WBC 15, hemoglobin 12.6, platelet count 291. Lipase 119, glucose 311, BUN 14, creatinine 0.85, sodium 134, potassium 4.4, chloride 101, CO2 is 28, calcium 9.7. LFTs are normal.   ASSESSMENT AND PLAN: The patient is a 54 year old white female with possible small bowel obstruction, hypertension, diabetes and gastroesophageal reflux disease.  1.  Diabetes. Blood glucose in the 300s, now currently n.p.o.  Due to her history of diabetes, I will continue her on low-dose Lantus. Will continue the high-dose insulin sliding scale as written. We will monitor her blood sugars closely.  2.  History of gastroesophageal reflux disease. We will recommend IV Protonix.  3.  Hypertension. P.o. meds on hold. Blood pressure is stable. We will use p.r.n. hydralazine if needed.  4.  Miscellaneous: The patient is Lovenox for deep vein thrombosis prophylaxis. Marland Kitchen   TIME SPENT: 40 minutes spent on this consult.    ____________________________ Lafonda Mosses. Posey Pronto, MD shp:dp D: 10/27/2012 09:50:59 ET T: 10/27/2012 60:63:01 ET JOB#: [id number]  cc:  Yomar Mejorado H. Posey Pronto, MD, <Dictator> Alric Seton MD ELECTRONICALLY SIGNED 10/29/2012 13:03

## 2014-06-02 NOTE — Discharge Summary (Signed)
PATIENT NAME:  Diana Dixon, Diana Dixon MR#:  [id number] DATE OF BIRTH:  Apr 19, 1960  DATE OF ADMISSION:  10/27/2012 DATE OF DISCHARGE:  10/29/2012  BRIEF HISTORY:  Diana Dixon is a 54 year old woman seen in the Emergency Room with abdominal pain, nausea, vomiting, diarrhea. Initial work-up in the Emergency Room suggested possible small bowel obstruction. She has a history of previous hysterectomy, pelvic abscess and colonoscopy. She does have a remote history of pancreatitis. Her symptoms were significant nausea, vomiting and diarrhea. CT scan demonstrated some small bowel mismatch suggestive of possible bowel obstruction. However, her clinical picture was inconsistent with that diagnosis.   HOSPITAL COURSE:  We admitted her to the hospital, put her on IV rehydration and attempted nasogastric decompression. She declined nasogastric decompression, and her symptoms improved over the next 24 hours. She was able to tolerate a liquid diet on the evening of the 18th and had a bowel movement which appeared closer to normal. She was discharged home on the 19th to be followed in the office in 7 to 10 days' time. Bathing, activity, and driving instructions were given the patient.   DISCHARGE MEDICATIONS INCLUDE: Azor 5 mg/20 mg once a day, Lantus 100 units/mL, 10 units twice a day, Protonix 40 mg once a day and paroxetine 12.5 mg once a day.   FINAL DISCHARGE DIAGNOSIS: Abdominal pain, possible small bowel obstruction.   ____________________________ Rodena Goldmann III, MD rle:cb D: 11/10/2012 17:15:19 ET T: 11/10/2012 20:18:00 ET JOB#: [id number]  cc: Rodena Goldmann III, MD, <Dictator> Rodena Goldmann MD ELECTRONICALLY SIGNED 11/12/2012 19:34
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
## 2014-06-03 NOTE — Discharge Summary (Signed)
PATIENT NAME:  Diana Dixon, Diana Dixon MR#:  [id number] DATE OF BIRTH:  [birth year]  DATE OF ADMISSION:  05/02/2013 DATE OF DISCHARGE:  05/02/2013  PRIMARY CARE PHYSICIAN: Dr. Dwyane Dee in [place], endocrinology.   FINAL DIAGNOSES: 1.  Likely viral gastroenteritis with abdominal pain, nausea, vomiting, and diarrhea.  2.  Diabetes.  3.  Hypertension.   MEDICATIONS ON DISCHARGE: Include Azor 5/20 one tablet daily, Lantus 10 units subcutaneous injection twice a day, Protonix 40 mg daily as needed for indigestion, Paxil 12.5 mg at bedtime, Humalog sliding scale as needed.   DIET: Low-sodium, carbohydrate-controlled diet, regular consistency.   ACTIVITY: As tolerated.   FOLLOWUP: 1 to 2 weeks with Dr. Dwyane Dee in [place], endocrinology.   HOSPITAL COURSE: The patient was admitted as an observation on April 29, 2013. Came in with abdominal pain. Was started on IV fluid hydration, p.r.n. nausea medications. Was kept n.p.o. Surgical consult was done by Dr. Marina Gravel, who wrote no present indication for surgical intervention. He thinks it is likely a gastroenteritis that will resolve on its own.   Laboratory and radiological data during the hospital course included a urinalysis that showed 1+ blood. Lipase 116. Glucose 99, BUN 16, creatinine 0.95, sodium 137, potassium 4.2, chloride 102, CO2 of 33, calcium 9.2. Liver function tests: Normal range. White blood cell count 6.6, hemoglobin and hematocrit 13.2 and 41.0, platelet count of 273. Abdomen flat and upright shows nonspecific mildly distended gas and fluid-filled small bowel loops, early or developing small bowel obstruction not excluded, no evidence of pneumoperitoneum. CT scan of the abdomen and pelvis showed multiple thick-walled dilated small bowel loops within the abdomen, no discrete transition point, may represent an enteritis. White blood cell count upon discharge 5.3.   HOSPITAL COURSE PER PROBLEM LIST:  1.  Likely viral gastroenteritis with abdominal  pain, nausea, vomiting, and diarrhea. The patient's nausea, vomiting had resolved. The patient was tolerating diet. Abdominal pain was a lot less. The patient still had a little diarrhea. The patient was given one dose of Levaquin here, but if it is viral, it should go away on its own. No antibiotics were given upon discharge. The patient is feeling much better.  2.  Diabetes. I told her to watch her sugars closely. If she is not eating that well, I would rather have her sugars on the higher side than on the lower side.  3.  For her hypertension, she is on Azor.  The patient was discharged home in stable condition.   TIME SPENT ON DISCHARGE: 35 minutes.   ____________________________ Tana Conch. Leslye Peer, MD rjw:jcm D: 05/02/2013 16:00:51 ET T: 05/02/2013 20:30:27 ET JOB#: [id number]  cc: Tana Conch. Leslye Peer, MD, <Dictator> Dr. Dwyane Dee, [facility], Endocrinology Marisue Brooklyn MD ELECTRONICALLY SIGNED 05/06/2013 16:28

## 2014-06-03 NOTE — Consult Note (Signed)
Brief Consult Note: Diagnosis: gastroenteritis, no clinical signs of SBO.   Patient was seen by consultant.   Consult note dictated.   Recommend further assessment or treatment.   Discussed with Attending MD.   Comments: I see no surgical indications at this time and would recommend medical admission.  Patient does not need NGT at this time.  Her presentation is similar to that in Sept 2014 along with her films at that time as well.  Electronic Signatures: Sherri Rad (MD)  (Signed 22-Mar-15 21:51)  Authored: Brief Consult Note   Last Updated: 22-Mar-15 21:51 by Sherri Rad (MD)

## 2014-06-03 NOTE — H&P (Signed)
PATIENT NAME:  Diana Dixon, Diana Dixon MR#:  [id number] DATE OF BIRTH:  1960-04-23  DATE OF ADMISSION:  05/01/2013  PRIMARY CARE PHYSICIAN: Dr. De Burrs.   CHIEF COMPLAINT: Abdominal pain.   HISTORY OF PRESENT ILLNESS: This is a 54 year old female with significant past medical history of diabetes, hypertension, depression, with admission in the past by surgery last year for possible small bowel obstruction. The patient presents today with complaints of abdominal pain. She denies any nausea, any vomiting, any fever, any chills, any constipation, or obstipation. Reports loose bowel movements for the last two days. The patient's CT abdomen and pelvis did not show any evidence of small bowel obstruction, but does show possible enteritis. Hospitalist requested to admit the patient for further work-up. She denies any fever, any chills, any chest pain, any shortness of breath.   PAST MEDICAL HISTORY: 1. Diabetes.  2. Hypertension.  3. Depression. 4.  Gastroesophageal reflux disease.  ALLERGIES: PENICILLIN AND SULFA.   HOME MEDICATIONS:  2. Lantus 12 units 2 times a day.  3. NovoLog sliding scale.  4. Protonix 40 mg daily.  5. Paroxetine 12.5 mg at bedtime.   SOCIAL HISTORY: No smoking. No drinking. No drug use.   FAMILY HISTORY: Significant for hypertension.   REVIEW OF SYSTEMS:  CONSTITUTIONAL:  Denies fever, chills, fatigue, weakness.  EYES: Denies blurry vision, double vision, inflammation, glaucoma. EARS, NOSE AND THROAT: Denies  hearing loss, epistaxis or discharge.  RESPIRATORY: Denies any cough, wheezing, hemoptysis, dyspnea.  CARDIOVASCULAR: Denies chest pain, edema, arrhythmia or palpitations.  GASTROINTESTINAL: Denies nausea, vomiting, diarrhea, constipation, obstipation. Reports abdominal pain. Denies any jaundice, rectal bleed.  GENITOURINARY: Denies dysuria, hematuria, or renal colic.  ENDOCRINE: Denies polyuria, polydipsia, heat or cold intolerance.  HEMATOLOGY: Denies anemia, easy  bruising, bleeding diathesis.  INTEGUMENT: Denies acne, rash or skin lesion.  MUSCULOSKELETAL: Denies any swelling, gout or cramps.  NEUROLOGIC: Denies CVA, TIA, headache, tremors, vertigo.  PSYCHOLOGICAL: Denies anxiety, insomnia, substance or alcohol abuse.   PHYSICAL EXAMINATION: VITAL SIGNS: Temperature 99.2, pulse 88, respiratory rate 16, blood pressure 145/80 , saturating 98% on room air.  GENERAL: Well-nourished female who looks comfortable in apparent distress.  HEENT: Head atraumatic, normocephalic. Pupils equal, reactive to light. Pink conjunctivae. Anicteric sclerae. Moist oral mucosa.  NECK: Supple. No thyromegaly. No JVD.  CHEST: Good air entry bilaterally. No wheezing, crackles, rhonchi.  CARDIOVASCULAR: S1, S2 heard. No rubs, murmur or gallops. ABDOMEN: Soft.  ABDOMEN: Has mild diffuse tenderness, but no rebound, no guarding No hepatosplenomegaly appreciated,  bowel sounds present in four quadrants.  EXTREMITIES: No edema. No clubbing. No cyanosis. Pedal pulses are +2 bilaterally.  PSYCHIATRIC: Appropriate affect. Awake, alert x 3. Intact judgment and insight.  NEUROLOGIC: Cranial nerves grossly intact. Motor five out of five. No focal deficits.  SKIN: Normal skin turgor. Warm and dry.  LYMPHATIC: No cervical lymphadenopathy could be appreciated.  MUSCULOSKELETAL: No joint effusion or erythema.   PERTINENT LABORATORIES: Glucose 99, BUN 16, creatinine 0.95, sodium 137, potassium 4.2, chloride 102, CO2 33, lipase 116, ALT 22, AST 14, alkaline phosphatase 86. White blood cells 6.6, hemoglobin 13.2, hematocrit 41, platelets 273. Urinalysis negative for leukocyte esterase or nitrites.   IMAGING STUDIES: CT abdomen and pelvis showing multiple thick walled dilated small bowel loops within the abdomen and pelvis with nondistended distal small bowel loops, but no discreet transition point. In fact, this may represent an enteritis given diffuse small bowel thickening or small bowel  obstruction with associated wall edema   ASSESSMENT AND PLAN:  1. Abdominal pain. This is most likely related to enteritis , there is low probability of small bowel obstruction given the fact that the patient is having looser stools, unknown nausea and vomiting. She will be admitted for observation. We will have her on IV Levaquin and levofloxacin. She will be kept on IV fluid hydration with p.r.n. nausea and pain medicine, and if needed, and we will keep her on p.o. for tonight and if no nausea and vomiting  by the morning, we will start her on clear liquid diet and evaluate.  There is some very low possibility of small bowel obstruction even though it seems unlikely from the patient's presentation, especially she does not have nausea. She does not have vomiting and she is having had loose bowel movements.  2. Hypertension: Blood pressure acceptable. Continue with home medication.  3. Diabetes mellitus: We will start the patient on insulin sliding scale and lower dose Lantus.  4. Depression. We will continue the patient on her home medication.  5. Deep vein thrombosis prophylaxis. Subcutaneous heparin.  6. Gastroesophageal reflux disease. Continue with PPI.   CODE STATUS: FULL CODE.   TOTAL TIME SPENT ON ADMISSION AND PATIENT CARE: 50 minutes.     ____________________________ Albertine Patricia, MD dse:sg D: 05/01/2013 23:50:05 ET T: 05/02/2013 06:35:38 ET JOB#: [id number]  cc: Albertine Patricia, MD, <Dictator> Yosmar Ryker Graciela Husbands MD ELECTRONICALLY SIGNED 05/02/2013 23:29

## 2014-06-03 NOTE — Consult Note (Signed)
PATIENT NAME:  Diana Dixon, Diana Dixon MR#:  [id number] DATE OF BIRTH:  10/07/1960  DATE OF CONSULTATION:  05/01/2013  REFERRING PHYSICIAN:   CONSULTING PHYSICIAN:  Arabel Barcenas A. Marina Gravel, MD  I was asked to see this patient by the Emergency Room physician, Dr. Karlton Lemon for a 2-day history of abdominal pain, which began after eating some spaghetti Friday evening associated with profuse nausea, vomiting and diarrhea. The patient had abdominal pain over the course of the weekend. She comes to the Emergency Room for further evaluation and management. She denies any fevers, yellow jaundice or sick contacts. Of note, the patient in September 2014 was admitted to the surgical service with similar type of illness. CT scan at that point demonstrated findings consistent with partial small bowel obstruction; however, her clinical scenario at that time did not fit the x-rays. It seemed like this is a similar process going on now. Currently, the patient is feeling somewhat better, but still having some abdominal pain.   ALLERGIES: SULFA AND PENICILLIN DRUGS.  MEDICATIONS: Humalog insulin, Lantus, Protonix and Paxil.   PAST MEDICAL HISTORY: Hypertension, type 1 diabetes and depression.   PAST SURGICAL HISTORY: Complex including a recent drainage and removal of an ovarian abscess in 2010. In addition, she has had a hysterectomy and laparotomy.   SOCIAL HISTORY: She does not smoke, does not drink and is employed.  PHYSICAL EXAMINATION:   GENERAL: She is a thin white female in no obvious distress.  VITAL SIGNS: Temperature is 99.2, pulse 92, respiratory rate 18, blood pressure 163/72. She has a 20.7 BMI, weight 124 pounds, 5 feet 2 inches.  LUNGS: Clear.  HEART: Regular rate and rhythm.  ABDOMEN: Demonstrates multiple scars. No obvious hernias. There is mild tenderness to deep palpation in the midepigastric and lower midline portions of her abdomen.  EXTREMITIES: Warm and well perfused.  NEUROLOGIC AND PSYCHIATRIC:  Unremarkable.   SKIN: Warm and well perfused.  RECTAL AND GENITOURINARY: Deferred.   Urinalysis is negative. White count is 6.6, hemoglobin is 13.2, platelet count 273,000. Liver function tests are unremarkable. Lipase is normal at 116. Electrolytes are unremarkable. Blood glucose is 103. Review of CT scan  and plain films demonstrate thickened small bowel loops within the abdomen and pelvis. No fluid or gas seen within the colon. A small amount of free brief free pelvic fluid is present. There is no evidence of abscess, pneumoperitoneum or pneumatosis intestinalis. The stomach is moderately dilated. The pancreas, adrenals, kidney  and gallbladder are unremarkable. Uterus is surgically absent.   IMPRESSION: This is a 54 year old white female with what sounds like viral gastroenteritis with nausea, vomiting and diarrhea. Plain films suggestive of bowel obstruction; however, I could not see an appropriate transition point to consider this diagnosis further.   PLAN: At this present, there is no indications for surgical intervention. I doubt that the patient has a clinically significant bowel obstruction. I think she rather has viral gastroenteritis and this will resolve with hydration, bowel rest and perhaps some IV antibiotics. Recommend medical admission, surgical consult and follow up in the morning.   ____________________________ Jeannette How. Marina Gravel, MD mab:aw D: 05/01/2013 22:10:47 ET T: 05/02/2013 06:37:35 ET JOB#: [id number]  cc: Elta Guadeloupe A. Marina Gravel, MD, <Dictator> Hortencia Conradi MD ELECTRONICALLY SIGNED 05/04/2013 13:49

## 2014-06-06 DIAGNOSIS — E109 Type 1 diabetes mellitus without complications: Secondary | ICD-10-CM | POA: Insufficient documentation

## 2014-06-06 DIAGNOSIS — R87629 Unspecified abnormal cytological findings in specimens from vagina: Secondary | ICD-10-CM | POA: Insufficient documentation

## 2014-06-06 DIAGNOSIS — F411 Generalized anxiety disorder: Secondary | ICD-10-CM | POA: Insufficient documentation

## 2014-06-06 DIAGNOSIS — F419 Anxiety disorder, unspecified: Secondary | ICD-10-CM | POA: Insufficient documentation

## 2014-06-06 DIAGNOSIS — F321 Major depressive disorder, single episode, moderate: Secondary | ICD-10-CM | POA: Insufficient documentation

## 2014-06-06 DIAGNOSIS — Z8679 Personal history of other diseases of the circulatory system: Secondary | ICD-10-CM | POA: Insufficient documentation

## 2014-06-06 DIAGNOSIS — Z8719 Personal history of other diseases of the digestive system: Secondary | ICD-10-CM | POA: Insufficient documentation

## 2014-06-06 DIAGNOSIS — R87628 Other abnormal cytological findings on specimens from vagina: Secondary | ICD-10-CM | POA: Insufficient documentation

## 2014-06-06 DIAGNOSIS — F329 Major depressive disorder, single episode, unspecified: Secondary | ICD-10-CM | POA: Insufficient documentation

## 2014-06-06 DIAGNOSIS — F32A Depression, unspecified: Secondary | ICD-10-CM | POA: Insufficient documentation

## 2014-06-22 ENCOUNTER — Ambulatory Visit (INDEPENDENT_AMBULATORY_CARE_PROVIDER_SITE_OTHER): Payer: Managed Care, Other (non HMO) | Admitting: Endocrinology

## 2014-06-22 ENCOUNTER — Encounter: Payer: Self-pay | Admitting: Endocrinology

## 2014-06-22 VITALS — BP 128/70 | HR 85 | Temp 97.9°F | Resp 16 | Ht 62.0 in | Wt 143.8 lb

## 2014-06-22 DIAGNOSIS — E1065 Type 1 diabetes mellitus with hyperglycemia: Secondary | ICD-10-CM

## 2014-06-22 DIAGNOSIS — I10 Essential (primary) hypertension: Secondary | ICD-10-CM

## 2014-06-22 DIAGNOSIS — IMO0002 Reserved for concepts with insufficient information to code with codable children: Secondary | ICD-10-CM

## 2014-06-22 LAB — HEMOGLOBIN A1C: Hgb A1c MFr Bld: 9.1 % — ABNORMAL HIGH (ref 4.6–6.5)

## 2014-06-22 NOTE — Patient Instructions (Addendum)
Relion meter from [place] blood sugars on waking up .Marland Kitchen 3-5 .Marland Kitchen times a week or before supper Also check blood sugars about 2 hours after a meal and do this after different meals by rotation Recommended blood sugar levels on waking up is 90-130 and about 2 hours after meal is 140-180 Please bring blood sugar monitor to each visit.  Lantus 24 and go up as directed every 3 days  Humalog 5-7 in am based on sugar level; add protein in am

## 2014-06-22 NOTE — Progress Notes (Signed)
Quick Note:  Please let patient know that the A1c still high at 9.1  ______

## 2014-06-22 NOTE — Progress Notes (Signed)
Patient ID: Diana Dixon, female   DOB: 1960-08-16, 54 y.o.   MRN: [id number]   Reason for Appointment: Diabetes follow-up   History of Present Illness   Diagnosis: Type 1 DIABETES MELITUS, diagnosis made in 1967     Insulin regimen: Lantus 16---20, Humalog 7 units at breakfast and 8-9units at lunch and supper  She has had long-standing type 1 diabetes with persistently poor control because of variability in blood sugars and difficulty with various self-care issues She has been reluctant to do carbohydrate counting to adjust her insulin doses and has variable readings after meals especially lunch Previously has had times when she would forget her Lantus in the evening and also mealtime dose She may have done a little better with taking Lantus twice a day but does not try to adjust the doses on her own    Recent history:  She has had persistently poor control of her diabetes despite frequently advising her on insulin changes and better compliance for day-to-day management of diet, insulin regimen and glucose monitoring A1c was the highest on her last visit at 9.8 On her last visit her Lantus was increased by 4 units in the evening and she was told to continue increasing the dose until fasting blood sugars are at target but she did not do so  Current blood sugar patterns and problems identified:  FASTING blood sugars are consistently high with only a couple of readings under 200  She does not understand the need to increase Lantus at night to control fasting readings and she thinks by taking more Humalog in the evening she may have better control overnight   because of cost of testing her glucose she has checked readings mostly in the mornings and sporadically later in the day  Post prandial readings after breakfast are low normal or sometimes low since she usually has a small amount of carbohydrate without any protein and still takes about 7 units Humalog  She still  continues to have drinks with sugar, sometimes with all 3 meals.  She will take her Humalog about 5-10 minutes after eating rather than before despite reminders to change her timing1 each visit  She does not want to use the V-go pump because of cost  She has only one reading after lunch which was relatively high; however does not know how to explain a reading of 43 around 5:30 PM  She does try to stay active and exercise with walking but this does not cause low sugars, may feel hypoglycemia if she is doing a lot of gardening and outside activities  Her A1c is consistently over 8% and recently over 9% consistently  DIET: Her control is made difficult by the fact that she likes to use drinks with sugar added such as ice tea at each meal and also eating unbalanced meals like Pop tarts, waffles or breakfast cereal with a sweet drink for breakfast    Glucometer: One Touch ultra 2          Blood Glucose readings from download:  PRE-MEAL Breakfast Lunch Dinner Bedtime Overall  Glucose range: 143-323 212, 196 43    Mean/median: 230    188/210   POST-MEAL PC Breakfast PC Lunch PC Dinner  Glucose range: 53-92 ?  236 75, 122  Mean/median:        PRE-MEAL Breakfast Lunch Dinner Bedtime Overall  Glucose range: 82-200+ 70-90  200-346   Mean/median:         Meals: 3  meals per day.  Eating eggs/meat for breakfast frequently without carbohydrate; dinner 6 pm         Physical activity: exercise:  trying to walk regularly in am or late pm 15-20 min         Wt Readings from Last 3 Encounters:  06/22/14 143 lb 12.8 oz (65.227 kg)  05/29/14 141 lb 1 oz (63.984 kg)  03/24/14 142 lb (41.962 kg)   Complications: Retinopathy  Lab Results  Component Value Date   HGBA1C 9.1* 06/22/2014   HGBA1C 9.8* 03/24/2014   HGBA1C 9.1* 12/22/2013   Lab Results  Component Value Date   MICROALBUR 19.5* 08/15/2013   LDLCALC 110* 08/15/2013   CREATININE 1.00 03/24/2014    Lab Results  Component Value  Date   MICROALBUR 19.5* 08/15/2013      Medication List       This list is accurate as of: 06/22/14  9:39 PM.  Always use your most recent med list.               amLODipine-olmesartan 5-20 MG per tablet  Commonly known as:  AZOR  Take 1 tablet by mouth daily.     glucose blood test strip  Commonly known as:  ONE TOUCH ULTRA TEST  Test 7 times daily dx code 250.03     HUMALOG KWIKPEN 100 UNIT/ML KiwkPen  Generic drug:  insulin lispro  INJECT 8 UNITS SUBCUTANEOUSLY THREE TIMES DAILY     insulin aspart 100 UNIT/ML FlexPen  Commonly known as:  NOVOLOG  Inject into the skin as needed.     Insulin Pen Needle 32G X 4 MM Misc  Commonly known as:  BD PEN NEEDLE NANO U/F  Use to inject insulin as directed     LANTUS SOLOSTAR 100 UNIT/ML Solostar Pen  Generic drug:  Insulin Glargine  INJECT 12 UNITS SUBCUTANEOUSLY TWICE DAILY     nystatin 100000 UNIT/ML suspension  Commonly known as:  MYCOSTATIN  Take 5 mLs (500,000 Units total) by mouth 4 (four) times daily.     nystatin-triamcinolone cream  Commonly known as:  MYCOLOG II     pantoprazole 40 MG tablet  Commonly known as:  PROTONIX  Take 1 tablet (40 mg total) by mouth daily.     PARoxetine 12.5 MG 24 hr tablet  Commonly known as:  PAXIL-CR     pravastatin 20 MG tablet  Commonly known as:  PRAVACHOL  Take 1 tablet (20 mg total) by mouth daily.     promethazine 25 MG tablet  Commonly known as:  PHENERGAN  Take 1 tablet (25 mg total) by mouth every 8 (eight) hours as needed for nausea or vomiting.        Allergies:  Allergies  Allergen Reactions  . Penicillins   . Sulfa Antibiotics     Past Medical History  Diagnosis Date  . Depression   . Diabetes mellitus without complication     Type I  . Anxiety   . Generalized anxiety disorder   . Major depressive disorder, single episode, moderate     Past Surgical History  Procedure Laterality Date  . Tonsillectomy    . Abdominal hysterectomy      Family  History  Problem Relation Age of Onset  . Thyroid disease Mother   . Breast cancer Mother   . Hypertension Mother     Social History:  reports that she has never smoked. She does not have any smokeless tobacco history on file. She reports that  she does not drink alcohol or use illicit drugs.  Review of Systems:  HYPERTENSION:  she has had long-standing hypertension treated with Azor.  Does not check blood pressure at home Blood pressure well controlled recently, no lightheadedness  Has history of hyperlipidemia   She is taking pravastatin 20 mg   No side effects with this   Lab Results  Component Value Date   CHOL 193 08/15/2013   HDL 68.40 08/15/2013   LDLCALC 110* 08/15/2013   LDLDIRECT 88.8 12/22/2013   TRIG 71.0 08/15/2013   CHOLHDL 3 08/15/2013    She does take Protonix for reflux/heartburn  She has had mild long-standing depression controlled with Paxiland followed by psychiatrist  Thyroid level normal in 2015      Examination:   BP 128/70 mmHg  Pulse 85  Temp(Src) 97.9 F (36.6 C)  Resp 16  Ht 5\' 2"  (1.575 m)  Wt 143 lb 12.8 oz (65.227 kg)  BMI 26.29 kg/m2  SpO2 97%  Body mass index is 26.29 kg/(m^2).    ASSESSMENT/ PLAN:   Diabetes type 1   Her blood sugars have been poorly controlled with her A1c over 9% consistently See history of present illness for current problems identified in blood sugar patterns and her current management. Not clear why she is appearing more insulin resistant with needing larger doses of insulin especially basal Highest blood sugars still appear to be high FASTING in the morning Difficult to assess postprandial control as she does not usually check readings after meals; however she is probably getting excessive insulin for breakfast Also discussed above she has poor diet with unbalanced meals including high carbohydrate breakfast, sometimes high fat meals along with drinks containing large amount of sugar She is still taking  mealtime insulin postprandially Does not understand concept of basal and bolus insulins Also concerned about cost of insulin and strips   PLAN:    More consistent glucose monitoring.  She can try the [employer] brand  Go up at least 4 units on the Lantus insulin in the evening.  Given her a flow sheet to increase the Lantus every 3 days to get morning sugar below 140 at least  Rotate the times of glucose monitoring with more readings after meals  Reduce insulin at breakfast  Add protein in the morning consistently  May need to increase lunchtime coverage if postprandial readings are high  Switch to Toujeo when Lantus is finished, brochure and co-pay card given, discussed differences with Lantus  More consistent balanced meals and avoid high glycemic index foods and drinks  HYPERTENSION: Her blood pressure is well-controlled and standing blood pressure is normal today  Patient Instructions  Relion meter from [place] blood sugars on waking up .Marland Kitchen 3-5 .Marland Kitchen times a week or before supper Also check blood sugars about 2 hours after a meal and do this after different meals by rotation Recommended blood sugar levels on waking up is 90-130 and about 2 hours after meal is 140-180 Please bring blood sugar monitor to each visit.  Lantus 24 and go up as directed every 3 days  Humalog 5-7 in am based on sugar level; add protein in am        Counseling time on subjects discussed above is over 50% of today's 25 minute visit  Diana Dixon 06/22/2014, 9:39 PM

## 2014-07-03 ENCOUNTER — Telehealth: Payer: Self-pay | Admitting: Endocrinology

## 2014-07-03 MED ORDER — INSULIN GLARGINE 300 UNIT/ML ~~LOC~~ SOPN: 40.0000 [IU] | PEN_INJECTOR | Freq: Every day | SUBCUTANEOUS | Status: DC

## 2014-07-03 NOTE — Telephone Encounter (Signed)
Patient called stating that her insurance will not cover the toujeo  Please call in the Lantus  She is currently out of insulin    Thank You

## 2014-07-03 NOTE — Telephone Encounter (Signed)
Toujeo is not active on pt med list.   Please advise if this is to be added or if more information is needed.

## 2014-07-03 NOTE — Telephone Encounter (Signed)
Patient stated that she need her medication Toujeo sent to [facility] but pharmist said they will have to order it, she need some Lantus in the meantime, because she is totally out. Please advise

## 2014-07-03 NOTE — Telephone Encounter (Signed)
She will need new prescription for Toujeo, starting with 40 units in the evening once a day to replace the Lantus

## 2014-07-03 NOTE — Telephone Encounter (Signed)
erx done

## 2014-07-03 NOTE — Telephone Encounter (Signed)
She can use a free coupon that was given to her for the [facility], needs to ask the pharmacist

## 2014-07-03 NOTE — Telephone Encounter (Signed)
Patient called and would like her Rx sent to her pharmacy she is out of her medication   Rx: [facility]: [place]    Thank you

## 2014-07-04 ENCOUNTER — Other Ambulatory Visit: Payer: Self-pay | Admitting: *Deleted

## 2014-07-04 MED ORDER — INSULIN GLARGINE 100 UNIT/ML SOLOSTAR PEN: 40.0000 [IU] | PEN_INJECTOR | Freq: Every day | SUBCUTANEOUS | Status: DC

## 2014-07-04 NOTE — Telephone Encounter (Signed)
LVM for pt to call back as soon as possible.   RE: MD indication below.

## 2014-08-30 ENCOUNTER — Other Ambulatory Visit: Payer: Self-pay | Admitting: Endocrinology

## 2014-09-19 ENCOUNTER — Ambulatory Visit (INDEPENDENT_AMBULATORY_CARE_PROVIDER_SITE_OTHER): Payer: Managed Care, Other (non HMO) | Admitting: Psychiatry

## 2014-09-19 ENCOUNTER — Encounter: Payer: Self-pay | Admitting: Psychiatry

## 2014-09-19 VITALS — BP 118/62 | HR 82 | Temp 97.3°F | Ht 62.0 in | Wt 144.6 lb

## 2014-09-19 DIAGNOSIS — F411 Generalized anxiety disorder: Secondary | ICD-10-CM | POA: Diagnosis not present

## 2014-09-19 DIAGNOSIS — F321 Major depressive disorder, single episode, moderate: Secondary | ICD-10-CM

## 2014-09-19 MED ORDER — HYDROXYZINE PAMOATE 25 MG PO CAPS: 25.0000 mg | ORAL_CAPSULE | Freq: Every evening | ORAL | Status: DC | PRN

## 2014-09-19 MED ORDER — PAROXETINE HCL ER 12.5 MG PO TB24
12.5000 mg | ORAL_TABLET | Freq: Every day | ORAL | Status: DC
Start: 2014-09-19 — End: 2015-01-16

## 2014-09-19 NOTE — Progress Notes (Signed)
BH MD/PA/NP OP Progress Note  09/19/2014 11:09 AM Diana Dixon  MRN:  [id number]  Subjective:  Patient returns for follow-up of her major depressive disorder and generalized anxiety disorder. She states things continue to go well. She states she's very happy. She states that she sometimes things out loud. She continues to enjoy working with her flowers and her dogs. 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 She states she looks forward to this as she has been out of work for approximately a year.  She denies any problems with medications. She did state she had some trouble sleeping last night. However sleep hygiene reveal she does do some daytime napping. She decided she would like to try some Vistaril as needed. Risk and benefits of this and been discussing patient's able to consent. Chief Complaint:  Chief Complaint    Follow-up; Medication Refill     Visit Diagnosis:  No diagnosis found.  Past Medical History:  Past Medical History  Diagnosis Date  . Depression   . Diabetes mellitus without complication     Type I  . Anxiety   . Generalized anxiety disorder   . Major depressive disorder, single episode, moderate   . Hypertension   . Diabetes mellitus type I     Past Surgical History  Procedure Laterality Date  . Tonsillectomy    . Abdominal hysterectomy     Family History:  Family History  Problem Relation Age of Onset  . Thyroid disease Mother   . Breast cancer Mother   . Hypertension Mother   . Depression Mother   . Anxiety disorder Mother    Social History:  History   Social History  . Marital Status: Widowed    Spouse Name: N/A  . Number of Children: N/A  . Years of Education: N/A   Social History Main Topics  . Smoking status: Never Smoker   . Smokeless tobacco: Never Used  . Alcohol Use: No  . Drug Use: No  . Sexual Activity:    Partners: Male   Other Topics Concern  . None   Social History Narrative   Regular exercise:  walk   Caffeine use: sodas   Additional History:   Assessment:   Musculoskeletal: Strength & Muscle Tone: within normal limits Gait & Station: normal Patient leans: N/A  Psychiatric Specialty Exam: HPI  Review of Systems  Psychiatric/Behavioral: Negative for depression, suicidal ideas, hallucinations, memory loss and substance abuse. The patient has insomnia. The patient is not nervous/anxious.     Blood pressure 118/62, pulse 82, temperature 97.3 F (36.3 C), temperature source Tympanic, height 5\' 2"  (1.575 m), weight 144 lb 9.6 oz (65.59 kg), SpO2 95 %.Body mass index is 26.44 kg/(m^2).  General Appearance: Meticulous, Neat and Well Groomed  Eye Contact:  Good  Speech:  Normal Rate  Volume:  Normal  Mood:  Good  Affect:  Congruent smiling  Thought Process:  Linear and Logical  Orientation:  Full (Time, Place, and Person)  Thought Content:  Negative  Suicidal Thoughts:  No  Homicidal Thoughts:  No  Memory:  Immediate;   Good Recent;   Good Remote;   Good  Judgement:  Good  Insight:  Good  Psychomotor Activity:  Negative  Concentration:  Good  Recall:  Jackson of Knowledge: Good  Language: Good  Akathisia:  Negative  Handed:  Right  AIMS (if indicated):  N/A  Assets:  Communication Skills Desire for Improvement Leisure  Time  ADL's:  Intact  Cognition: WNL  Sleep:  Fair   Is the patient at risk to self?  No. Has the patient been a risk to self in the past 6 months?  No. Has the patient been a risk to self within the distant past?  No. Is the patient a risk to others?  No. Has the patient been a risk to others in the past 6 months?  No. Has the patient been a risk to others within the distant past?  No.  Current Medications: Current Outpatient Prescriptions  Medication Sig Dispense Refill  . amLODipine-olmesartan (AZOR) 5-20 MG per tablet Take 1 tablet by mouth daily. 30 tablet 5  . glucose blood (ONE TOUCH ULTRA TEST) test strip Test 7 times daily dx  code 250.03 250 each 3  . HUMALOG KWIKPEN 100 UNIT/ML KiwkPen INJECT 8 UNITS SUBCUTANEOUSLY THREE TIMES DAILY 30 mL 1  . insulin aspart (NOVOLOG) 100 UNIT/ML FlexPen Inject into the skin as needed.    . Insulin Glargine (LANTUS SOLOSTAR) 100 UNIT/ML Solostar Pen Inject 40 Units into the skin daily at 10 pm. 5 pen 3  . Insulin Pen Needle (BD PEN NEEDLE NANO U/F) 32G X 4 MM MISC Use to inject insulin as directed 200 each 5  . nystatin (MYCOSTATIN) 100000 UNIT/ML suspension Take 5 mLs (500,000 Units total) by mouth 4 (four) times daily. 120 mL 0  . nystatin-triamcinolone (MYCOLOG II) cream     . pantoprazole (PROTONIX) 40 MG tablet Take 1 tablet (40 mg total) by mouth daily. 30 tablet 3  . PARoxetine (PAXIL-CR) 12.5 MG 24 hr tablet Take 1 tablet (12.5 mg total) by mouth daily. 30 tablet 4  . pravastatin (PRAVACHOL) 20 MG tablet TAKE 1 TABLET BY MOUTH ONCE A DAY 30 tablet 5  . promethazine (PHENERGAN) 25 MG tablet Take 1 tablet (25 mg total) by mouth every 8 (eight) hours as needed for nausea or vomiting. 6 tablet 0  . hydrOXYzine (VISTARIL) 25 MG capsule Take 1 capsule (25 mg total) by mouth at bedtime as needed (if one capsule is not effective, can take two at bedtime). 60 capsule 3   No current facility-administered medications for this visit.    Medical Decision Making:  Review of Medication Regimen & Side Effects (2) and Review of New Medication or Change in Dosage (2)  Treatment Plan Summary:Medication management and Plan She continues to remain stable on her Paxil CR. We will continue her Paxil CR 12.5 mg daily. I will we'll write her for some Vistaril 25-50 mg at bedtime as needed for insomnia. Risk and benefits of this medication and been discussing patient's able to consent. Patient will continue her follow-up every 4 months however I've encouraged call with any questions or concerns prior to her next appointment.   Faith Rogue 09/19/2014, 11:09 AM

## 2014-09-22 ENCOUNTER — Ambulatory Visit: Payer: Managed Care, Other (non HMO) | Admitting: Endocrinology

## 2014-09-28 ENCOUNTER — Ambulatory Visit: Payer: Self-pay | Admitting: Psychiatry

## 2014-10-09 ENCOUNTER — Other Ambulatory Visit: Payer: Self-pay | Admitting: *Deleted

## 2014-10-09 ENCOUNTER — Encounter: Payer: Self-pay | Admitting: *Deleted

## 2014-10-09 DIAGNOSIS — H25042 Posterior subcapsular polar age-related cataract, left eye: Secondary | ICD-10-CM | POA: Diagnosis not present

## 2014-10-09 DIAGNOSIS — Z880 Allergy status to penicillin: Secondary | ICD-10-CM | POA: Diagnosis not present

## 2014-10-09 DIAGNOSIS — Z882 Allergy status to sulfonamides status: Secondary | ICD-10-CM | POA: Diagnosis not present

## 2014-10-09 DIAGNOSIS — H4312 Vitreous hemorrhage, left eye: Secondary | ICD-10-CM | POA: Diagnosis not present

## 2014-10-09 DIAGNOSIS — E780 Pure hypercholesterolemia: Secondary | ICD-10-CM | POA: Diagnosis not present

## 2014-10-09 DIAGNOSIS — I10 Essential (primary) hypertension: Secondary | ICD-10-CM | POA: Diagnosis not present

## 2014-10-09 DIAGNOSIS — Z8719 Personal history of other diseases of the digestive system: Secondary | ICD-10-CM | POA: Diagnosis not present

## 2014-10-09 DIAGNOSIS — R011 Cardiac murmur, unspecified: Secondary | ICD-10-CM | POA: Diagnosis not present

## 2014-10-09 DIAGNOSIS — H2512 Age-related nuclear cataract, left eye: Secondary | ICD-10-CM | POA: Diagnosis not present

## 2014-10-09 DIAGNOSIS — K219 Gastro-esophageal reflux disease without esophagitis: Secondary | ICD-10-CM | POA: Diagnosis not present

## 2014-10-09 DIAGNOSIS — F329 Major depressive disorder, single episode, unspecified: Secondary | ICD-10-CM | POA: Diagnosis not present

## 2014-10-09 DIAGNOSIS — E10359 Type 1 diabetes mellitus with proliferative diabetic retinopathy without macular edema: Secondary | ICD-10-CM | POA: Diagnosis not present

## 2014-10-09 MED ORDER — AMLODIPINE-OLMESARTAN 5-20 MG PO TABS: 1.0000 | ORAL_TABLET | Freq: Every day | ORAL | Status: DC

## 2014-10-09 MED ORDER — INSULIN GLARGINE 100 UNIT/ML SOLOSTAR PEN: PEN_INJECTOR | SUBCUTANEOUS | Status: DC

## 2014-10-11 ENCOUNTER — Encounter
Admission: RE | Disposition: A | Discharge status: Home / Self Care | Payer: Self-pay | Source: Ambulatory Visit | Attending: Ophthalmology

## 2014-10-11 ENCOUNTER — Ambulatory Visit: Payer: Managed Care, Other (non HMO) | Admitting: Anesthesiology

## 2014-10-11 ENCOUNTER — Encounter: Payer: Self-pay | Admitting: *Deleted

## 2014-10-11 ENCOUNTER — Ambulatory Visit
Admission: RE | Admit: 2014-10-11 | Discharge: 2014-10-11 | Disposition: A | Discharge status: Home / Self Care | Payer: Managed Care, Other (non HMO) | Source: Ambulatory Visit | Attending: Ophthalmology | Admitting: Ophthalmology

## 2014-10-11 DIAGNOSIS — R011 Cardiac murmur, unspecified: Secondary | ICD-10-CM | POA: Insufficient documentation

## 2014-10-11 DIAGNOSIS — Z880 Allergy status to penicillin: Secondary | ICD-10-CM | POA: Insufficient documentation

## 2014-10-11 DIAGNOSIS — E10359 Type 1 diabetes mellitus with proliferative diabetic retinopathy without macular edema: Secondary | ICD-10-CM | POA: Insufficient documentation

## 2014-10-11 DIAGNOSIS — K219 Gastro-esophageal reflux disease without esophagitis: Secondary | ICD-10-CM | POA: Insufficient documentation

## 2014-10-11 DIAGNOSIS — H4312 Vitreous hemorrhage, left eye: Secondary | ICD-10-CM | POA: Diagnosis not present

## 2014-10-11 DIAGNOSIS — H2512 Age-related nuclear cataract, left eye: Secondary | ICD-10-CM | POA: Insufficient documentation

## 2014-10-11 DIAGNOSIS — E780 Pure hypercholesterolemia: Secondary | ICD-10-CM | POA: Insufficient documentation

## 2014-10-11 DIAGNOSIS — H25042 Posterior subcapsular polar age-related cataract, left eye: Secondary | ICD-10-CM | POA: Insufficient documentation

## 2014-10-11 DIAGNOSIS — Z8719 Personal history of other diseases of the digestive system: Secondary | ICD-10-CM | POA: Insufficient documentation

## 2014-10-11 DIAGNOSIS — F329 Major depressive disorder, single episode, unspecified: Secondary | ICD-10-CM | POA: Insufficient documentation

## 2014-10-11 DIAGNOSIS — Z882 Allergy status to sulfonamides status: Secondary | ICD-10-CM | POA: Insufficient documentation

## 2014-10-11 DIAGNOSIS — I10 Essential (primary) hypertension: Secondary | ICD-10-CM | POA: Insufficient documentation

## 2014-10-11 HISTORY — DX: Cardiac murmur, unspecified: R01.1

## 2014-10-11 HISTORY — PX: PARS PLANA VITRECTOMY: SHX2166

## 2014-10-11 HISTORY — DX: Gastro-esophageal reflux disease without esophagitis: K21.9

## 2014-10-11 LAB — GLUCOSE, CAPILLARY
GLUCOSE-CAPILLARY: 192 mg/dL — AB (ref 65–99)
Glucose-Capillary: 217 mg/dL — ABNORMAL HIGH (ref 65–99)

## 2014-10-11 SURGERY — PARS PLANA VITRECTOMY WITH 25 GAUGE
Anesthesia: Monitor Anesthesia Care | Laterality: Left | Wound class: Clean

## 2014-10-11 SURGERY — CANCELLED PROCEDURE
Anesthesia: General

## 2014-10-11 MED ORDER — ALFENTANIL 500 MCG/ML IJ INJ
INJECTION | INTRAMUSCULAR | Status: DC | PRN
  Administered 2014-10-11: 500 ug via INTRAVENOUS

## 2014-10-11 MED ORDER — CEFUROXIME OPHTHALMIC INJECTION 1 MG/0.1 ML
INJECTION | OPHTHALMIC | Status: AC
  Filled 2014-10-11: qty 0.1

## 2014-10-11 MED ORDER — CYCLOPENTOLATE HCL 2 % OP SOLN
1.0000 [drp] | OPHTHALMIC | Status: AC | PRN
  Administered 2014-10-11 (×3): 1 [drp] via OPHTHALMIC

## 2014-10-11 MED ORDER — BSS PLUS IO SOLN
Freq: Once | INTRAOCULAR | Status: DC
  Filled 2014-10-11: qty 500

## 2014-10-11 MED ORDER — HYPROMELLOSE 0.3 % OP GEL
OPHTHALMIC | Status: AC
  Filled 2014-10-11: qty 3.5

## 2014-10-11 MED ORDER — DEXAMETHASONE SODIUM PHOSPHATE 10 MG/ML IJ SOLN
INTRAMUSCULAR | Status: AC
  Filled 2014-10-11: qty 1

## 2014-10-11 MED ORDER — HYALURONIDASE HUMAN 150 UNIT/ML IJ SOLN
INTRAMUSCULAR | Status: AC
  Filled 2014-10-11: qty 1

## 2014-10-11 MED ORDER — ATROPINE SULFATE 1 % OP SOLN
OPHTHALMIC | Status: DC | PRN
  Administered 2014-10-11: 2 [drp] via OPHTHALMIC

## 2014-10-11 MED ORDER — BSS PLUS IO SOLN
INTRAOCULAR | Status: DC | PRN
  Administered 2014-10-11: 1 via INTRAOCULAR

## 2014-10-11 MED ORDER — ATROPINE SULFATE 1 % OP SOLN
OPHTHALMIC | Status: AC
  Filled 2014-10-11: qty 5

## 2014-10-11 MED ORDER — SODIUM CHLORIDE 0.9 % IV SOLN
INTRAVENOUS | Status: DC
  Administered 2014-10-11 (×2): via INTRAVENOUS

## 2014-10-11 MED ORDER — TETRACAINE HCL 1 % IJ SOLN
INTRAMUSCULAR | Status: AC
  Filled 2014-10-11: qty 2

## 2014-10-11 MED ORDER — MIDAZOLAM HCL 2 MG/2ML IJ SOLN
INTRAMUSCULAR | Status: DC | PRN
  Administered 2014-10-11 (×2): 1 mg via INTRAVENOUS

## 2014-10-11 MED ORDER — LIDOCAINE HCL (PF) 4 % IJ SOLN
INTRAMUSCULAR | Status: AC
  Filled 2014-10-11: qty 5

## 2014-10-11 MED ORDER — NA CHONDROIT SULF-NA HYALURON 40-17 MG/ML IO SOLN
INTRAOCULAR | Status: AC
  Filled 2014-10-11: qty 1

## 2014-10-11 MED ORDER — PHENYLEPHRINE HCL 10 % OP SOLN
1.0000 [drp] | OPHTHALMIC | Status: AC | PRN
  Administered 2014-10-11 (×3): 1 [drp] via OPHTHALMIC

## 2014-10-11 MED ORDER — TETRACAINE HCL 0.5 % OP SOLN
OPHTHALMIC | Status: DC | PRN
  Administered 2014-10-11: 2 [drp] via OPHTHALMIC

## 2014-10-11 MED ORDER — NEOMYCIN-POLYMYXIN-DEXAMETH 3.5-10000-0.1 OP OINT
TOPICAL_OINTMENT | OPHTHALMIC | Status: AC
  Filled 2014-10-11: qty 3.5

## 2014-10-11 MED ORDER — NA CHONDROIT SULF-NA HYALURON 40-30 MG/ML IO SOLN
INTRAOCULAR | Status: DC | PRN
  Administered 2014-10-11: .5 mL via INTRAOCULAR

## 2014-10-11 MED ORDER — ACETAMINOPHEN 500 MG PO TABS
500.0000 mg | ORAL_TABLET | Freq: Once | ORAL | Status: AC
Start: 2014-10-11 — End: 2014-10-11
  Administered 2014-10-11: 500 mg via ORAL

## 2014-10-11 MED ORDER — ONDANSETRON HCL 4 MG/2ML IJ SOLN: 4.0000 mg | Freq: Once | INTRAMUSCULAR | Status: DC | PRN

## 2014-10-11 MED ORDER — NEOMYCIN-POLYMYXIN-DEXAMETH 0.1 % OP OINT
TOPICAL_OINTMENT | OPHTHALMIC | Status: DC | PRN
  Administered 2014-10-11: 1 via OPHTHALMIC

## 2014-10-11 MED ORDER — BUPIVACAINE HCL (PF) 0.75 % IJ SOLN
INTRAMUSCULAR | Status: AC
  Filled 2014-10-11: qty 10

## 2014-10-11 MED ORDER — LIDOCAINE HCL (PF) 4 % IJ SOLN
INTRAMUSCULAR | Status: DC | PRN
  Administered 2014-10-11: 08:00:00 via OPHTHALMIC

## 2014-10-11 MED ORDER — TRIAMCINOLONE ACETONIDE 40 MG/ML IJ SUSP
INTRAMUSCULAR | Status: AC
  Filled 2014-10-11: qty 1

## 2014-10-11 MED ORDER — DEXAMETHASONE SODIUM PHOSPHATE 10 MG/ML IJ SOLN
INTRAMUSCULAR | Status: DC | PRN
  Administered 2014-10-11: 10 mg

## 2014-10-11 MED ORDER — PHENYLEPHRINE HCL 10 % OP SOLN
OPHTHALMIC | Status: AC
  Administered 2014-10-11: 1 [drp] via OPHTHALMIC
  Filled 2014-10-11: qty 5

## 2014-10-11 MED ORDER — CYCLOPENTOLATE HCL 2 % OP SOLN
OPHTHALMIC | Status: AC
  Administered 2014-10-11: 1 [drp] via OPHTHALMIC
  Filled 2014-10-11: qty 2

## 2014-10-11 MED ORDER — HYPROMELLOSE 0.3 % OP GEL
OPHTHALMIC | Status: DC | PRN
  Administered 2014-10-11: 1 via OPHTHALMIC

## 2014-10-11 MED ORDER — ACETAMINOPHEN 500 MG PO TABS
ORAL_TABLET | ORAL | Status: AC
  Filled 2014-10-11: qty 10

## 2014-10-11 MED ORDER — FENTANYL CITRATE (PF) 100 MCG/2ML IJ SOLN: 25.0000 ug | INTRAMUSCULAR | Status: DC | PRN

## 2014-10-11 SURGICAL SUPPLY — 40 items
25 COMBINED PROCEDURE PACK IMPLANT
25 GA. LASER IMPLANT
APPLICATOR COTTON TIP 6IN STRL (MISCELLANEOUS) ×8 IMPLANT
CANNULA ANT/CHMB 27GA (MISCELLANEOUS) ×2 IMPLANT
CANNULA SOFT TIP 25G (CANNULA) ×2 IMPLANT
CARTRIDGE IOL B-STYLE MONARCH (MISCELLANEOUS) ×2 IMPLANT
CORD BIP STRL DISP 12FT (MISCELLANEOUS) IMPLANT
CUP MEDICINE 2OZ PLAST GRAD ST (MISCELLANEOUS) ×2 IMPLANT
ERASER HMR WETFIELD 25G (MISCELLANEOUS) IMPLANT
FORCEPS GRIESH GRASP 25G (INSTRUMENTS) IMPLANT
FORCEPS GRIESH ILM PLUS 25G (INSTRUMENTS) IMPLANT
GLOVE BIO SURGEON STRL SZ8 (GLOVE) ×2 IMPLANT
GLOVE SURG LX 6.5 MICRO (GLOVE) ×2
GLOVE SURG LX STRL 6.5 MICRO (GLOVE) ×2 IMPLANT
GOWN STRL REUS W/ TWL LRG LVL3 (GOWN DISPOSABLE) ×2 IMPLANT
GOWN STRL REUS W/TWL LRG LVL3 (GOWN DISPOSABLE) ×2
KELMAN IMPLANT
KNIFE OPTIMUM SIDEPORT 15DEG (MISCELLANEOUS) ×2 IMPLANT
LENS BIOM OPTIC SET 200MM DISP (MISCELLANEOUS) ×2 IMPLANT
LENS IOL ACRSF MP 19.5 (Intraocular Lens) ×1 IMPLANT
LENS IOL ACRYSOF POST 19.5 (Intraocular Lens) ×2 IMPLANT
LENS VITRECTOMY FLAT DISP (MISCELLANEOUS) IMPLANT
NDL RETROBULBAR .5 NSTRL (NEEDLE) ×2 IMPLANT
NEEDLE FILTER BLUNT 18X 1/2SAF (NEEDLE) ×1
NEEDLE FILTER BLUNT 18X1 1/2 (NEEDLE) ×1 IMPLANT
PACK EYE AFTER SURG (MISCELLANEOUS) ×2 IMPLANT
PACK VITRECTOMY (MISCELLANEOUS) ×2 IMPLANT
PACK VITRECTOMY CASSETTE 25GA (MISCELLANEOUS) ×2 IMPLANT
PROBE DIRECTIONAL LASER (MISCELLANEOUS) ×2 IMPLANT
PROBE LASER ILLUM FLEX CVD 25G (OPHTHALMIC) IMPLANT
SLIT KNIFE ×2 IMPLANT
SOL ANTI-FOG 6CC FOG-OUT (MISCELLANEOUS) ×1 IMPLANT
SOL FOG-OUT ANTI-FOG 6CC (MISCELLANEOUS) ×1
SOL PREP PVP 2OZ (MISCELLANEOUS) ×2
SOLUTION PREP PVP 2OZ (MISCELLANEOUS) ×1 IMPLANT
STRAP SAFETY BODY (MISCELLANEOUS) ×2 IMPLANT
SUT BONE WAX W31G (SUTURE) ×2 IMPLANT
SYRINGE 10CC LL (SYRINGE) ×2 IMPLANT
TIP MICRO ABS TURBO 30 KELMAN (MISCELLANEOUS) ×2 IMPLANT
VITRECTOMY/ACCURSS (Intraocular Lens) ×2 IMPLANT

## 2014-10-11 NOTE — Anesthesia Preprocedure Evaluation (Signed)
Anesthesia Evaluation  Patient identified by MRN, date of birth, ID band Patient awake    Reviewed: Allergy & Precautions, NPO status , Patient's Chart, lab work & pertinent test results  Airway Mallampati: III  TM Distance: >3 FB Neck ROM: Limited    Dental  (+) Teeth Intact   Pulmonary    Pulmonary exam normal       Cardiovascular Exercise Tolerance: Good hypertension, Pt. on medications Normal cardiovascular exam    Neuro/Psych    GI/Hepatic GERD-  Medicated and Controlled,  Endo/Other  diabetes, Type 1  Renal/GU      Musculoskeletal   Abdominal (+)  Abdomen: soft.    Peds  Hematology   Anesthesia Other Findings   Reproductive/Obstetrics                             Anesthesia Physical Anesthesia Plan  ASA: III  Anesthesia Plan: MAC   Post-op Pain Management:    Induction: Intravenous  Airway Management Planned: Nasal Cannula  Additional Equipment:   Intra-op Plan:   Post-operative Plan:   Informed Consent: I have reviewed the patients History and Physical, chart, labs and discussed the procedure including the risks, benefits and alternatives for the proposed anesthesia with the patient or authorized representative who has indicated his/her understanding and acceptance.     Plan Discussed with: CRNA  Anesthesia Plan Comments:         Anesthesia Quick Evaluation

## 2014-10-11 NOTE — H&P (Signed)
.  Previous H&P scanned in reviewed, patient examined, and no interval changes.  Please see scanned record for complete information.   

## 2014-10-11 NOTE — Transfer of Care (Signed)
Immediate Anesthesia Transfer of Care Note  Patient: Diana Dixon  Procedure(s) Performed: Procedure(s) with comments: PARS PLANA VITRECTOMY WITH 25 GAUGE,CATARACT EXTRACTION WITH IOL INSERTION , ENDOLASER (Left) - [place] AP5  CDE CASETTE LOT # [id number] h  Patient Location: PACU  Anesthesia Type:MAC  Level of Consciousness: awake, alert  and oriented  Airway & Oxygen Therapy: Patient Spontanous Breathing  Post-op Assessment: Report given to RN and Post -op Vital signs reviewed and stable  Post vital signs: Reviewed and stable  Last Vitals:  Filed Vitals:   10/11/14 0911  BP: 116/38  Pulse: 84  Temp: 36.5 C  Resp:     Complications: No apparent anesthesia complications

## 2014-10-11 NOTE — Op Note (Signed)
Marland KitchenINDICATIONS & JUSTIFICATIONS FOR SURGERY: The patient was evaluated in the clinic for a nonclearing vitreous hemorrhage in the left eye.  Patient also had a visually significant nuclear and posterior subcapsular cataract.  The risks, benefits, alternatives, and complications were discussed with the patient, and patient elected to proceed with pars plana vitrectomy with phacoemulsification with intraocular lens implant in the left eye.    PREOPERATIVE DIAGNOSIS: Vitreous hemorrhage, proliferative diabetic retinopathy, visually significant cataract left eye.             POST OPERATIVE DIAGNOSIS: (1)  Vitreous hemorrhage, left eye,   (2)  Proliferative diabetic retinopathy, left         Eye.  (3) Visually significant nuclear sclerotic and posterior subcapsular      cataract, left eye                     OPERATION PERFORMED: Phacoemulsification with posterior chamber intraocular lens, 25-gauge pars plana vitrectomy with PRP, air-fluid exchange, and air, left eye.                   ANESTHESIA: MAC with peribulbar block.   COMPLICATIONS: None.     BLOOD LOSS: Minimal.   SPECIMEN: None.   DESCRIPTION OF PROCEDURE: On the day of surgery, the patient was greeted in the preoperative holding area.  All questions were answered and the left eye was marked.  The patient was then taken into the operating room in the supine position.    Monitored anesthesia care was administered. Next, 5 ml of a retrobulbar block consisting of 4% xylocaine plain, 0.75% sensorcaine plain and hylenex 100u was injected. The left eye was then prepped and draped in the usual sterile fashion.  Three 25-gauge trocars were inserted in the usual position. The infusion cannula was checked to make sure it was in the vitreous cavity prior to starting the infusion. First the phacoemulsification procedure. A paracentsis blade was used to make a paracentesis at the 2 o clock position.  Viscoat was used to coat the endothelium and fill the anterior  chamber.  A keratome was then used to make a triplanar corneal insicion at the 10 o clock position. A cycstome was used to start, and utrada forcep to finish, the continuous curvilinear capsulorrhexis.  Of note near the 6 o clock position the rhexis started to move outward. Additional viscoat was added to the anterior chamber and the utrada was used to start a new nick and complete the rhexis.  Carefully, BSS on a cannula was used to hydrodissect the lens. Next, the phaco handpiece was used to make grooves in the divide and conquer fashion.  Net, quadrant removal was performed.  Irrigation aspiration handpiece was then used to remove the remaining cortical material. The bag was then filled with viscoelastic.  An MA60AC lens, 19.5 D SN [id number] was then injected into the capsular bag and positioned using a sinskey hook.  Remaining viscoelastic was removed from the bag and around the lens as well as the anterior chamber.  The paracentesis was hydrated and watertight. The corneal wound was also hydrated and closed with a single 10.0 nylon suture which was rotated.  Attention was then drawn to the posterior segment.     A core vitrectomy was then performed removing the blood from the vitreous cavity.   The patient did not have a posterior vitreous detachment and so the core vitreous was removed, a rent was noted in the posterior hyaloid face and this  hole was used to trim the gel in the midperiphery to separate it from the attachment at the nerve. The peripheral gel was then then trimmed for 360 degrees out to the periphery. The gel on the nerve was then engaged and released from the nerve. There was a small superotemporal area of plaque around which the vitreous was trimmed for 360 degrees relieving all traction from this area, and hemostasis was achieved with laser. Notably there was a small round retinal hole adjacent to this area which was also lasered for 360 degrees. Next, endolaser PRP was applied for 360  degrees in the periphery to the arcades.  Peripheral retina for 360 degrees was inspected with scleral depression.  There were no retinal tears or breaks to be found.  Fill in PRP was added for 360 degrees. An air-fluid exchange was performed.  The patient was left under air.  The trocars were then removed and airtight.  Subconjunctival dexamethasone was injected.  The patient was then patched and shielded with neo-poly-dex ointment and a drop of atropine and taken to the recovery area in stable condition.

## 2014-10-11 NOTE — Progress Notes (Signed)
Dr rice called about blood sugar  No coverage

## 2014-10-11 NOTE — Anesthesia Postprocedure Evaluation (Signed)
  Anesthesia Post-op Note  Patient: Scarlette Ar  Procedure(s) Performed: Procedure(s) with comments: PARS PLANA VITRECTOMY WITH 25 GAUGE,CATARACT EXTRACTION WITH IOL INSERTION , ENDOLASER (Left) - [place] AP5  CDE CASETTE LOT # [id number] h  Anesthesia type:MAC  Patient location: PACU  Post pain: Pain level controlled  Post assessment: Post-op Vital signs reviewed, Patient's Cardiovascular Status Stable, Respiratory Function Stable, Patent Airway and No signs of Nausea or vomiting  Post vital signs: Reviewed and stable  Last Vitals:  Filed Vitals:   10/11/14 1003  BP: 140/54  Pulse: 79  Temp:   Resp:     Level of consciousness: awake, alert  and patient cooperative  Complications: No apparent anesthesia complications

## 2014-10-12 ENCOUNTER — Telehealth: Payer: Self-pay | Admitting: Endocrinology

## 2014-10-12 NOTE — Telephone Encounter (Signed)
I spoke with Diana Dixon to let her know that her rx was sent in on 10/09/14,

## 2014-10-12 NOTE — Telephone Encounter (Signed)
Patient would like to know if her Rx was call in  Rx: [place]: [place]   Please call patient either way so she knows her Rx was sent    Thank you

## 2014-10-13 ENCOUNTER — Ambulatory Visit: Payer: Managed Care, Other (non HMO) | Admitting: Endocrinology

## 2014-10-18 ENCOUNTER — Ambulatory Visit: Payer: Managed Care, Other (non HMO) | Admitting: Endocrinology

## 2014-10-23 ENCOUNTER — Ambulatory Visit (INDEPENDENT_AMBULATORY_CARE_PROVIDER_SITE_OTHER): Payer: Managed Care, Other (non HMO) | Admitting: Endocrinology

## 2014-10-23 VITALS — BP 122/58 | HR 78 | Temp 98.6°F | Ht 62.0 in | Wt 145.4 lb

## 2014-10-23 DIAGNOSIS — E7800 Pure hypercholesterolemia, unspecified: Secondary | ICD-10-CM

## 2014-10-23 DIAGNOSIS — I10 Essential (primary) hypertension: Secondary | ICD-10-CM | POA: Diagnosis not present

## 2014-10-23 DIAGNOSIS — E1065 Type 1 diabetes mellitus with hyperglycemia: Secondary | ICD-10-CM

## 2014-10-23 DIAGNOSIS — IMO0002 Reserved for concepts with insufficient information to code with codable children: Secondary | ICD-10-CM

## 2014-10-23 LAB — POCT GLYCOSYLATED HEMOGLOBIN (HGB A1C): HEMOGLOBIN A1C: 8.8

## 2014-10-23 NOTE — Patient Instructions (Addendum)
Lantus 25 units in pm, 16 in am  Must take 3-6 more Humalog at lunch or supper for hi fat meals; try taking 5-10 min before meals  More sugars at night

## 2014-10-23 NOTE — Progress Notes (Signed)
Patient ID: Diana Dixon, female   DOB: [birth year], 54 y.o.   MRN: [id number]   Reason for Appointment: Diabetes follow-up   History of Present Illness   Diagnosis: Type 1 DIABETES MELITUS, diagnosis made in 1967     Insulin regimen: Lantus 16---24, Humalog 7 units at breakfast and 8-9units at lunch and supper  She has had long-standing type 1 diabetes with persistently poor control because of variability in blood sugars and difficulty with various self-care issues She has been reluctant to do carbohydrate counting to adjust her insulin doses and has variable readings after meals especially lunch Previously has had times when she would forget her Lantus in the evening and also mealtime dose She may have done a little better with taking Lantus twice a day but does not try to adjust the doses on her own    Recent history:  She has had persistently poor control of her diabetes despite frequently advising her on insulin changes and better compliance for day-to-day management of diet, insulin regimen and glucose monitoring A1c is only slightly better on this visit at 8.8 On her last visit her Lantus was increased by 4 units in the evening   Current blood sugar patterns and problems identified:  FASTING blood sugars are mostly high although relatively better compared to her last visit  Review of her recent meals indicate that when she has a high fat meal in the evening her blood sugars and to be higher the next morning  She does not check her blood sugars consistently when she is going out to eat and also with higher fat meals; some readings are over 300, highest 412 a couple of days ago on Saturday  POSTPRANDIAL readings are fairly good in the afternoon but does still have a couple of high readings  Blood sugars are inconsistent after supper, has had a couple of good readings also  She had significantly high readings consistently high with only a couple of readings under  200  She was told to try to 0 insulin but this was not covered by her insurance  She will take her Humalog about 5-10 minutes after eating rather than before despite reminders to change her timing  She has reduced her regular soft drinks but does have some slightly sweetened tea at suppertime at times  She does not want to use the V-go pump because of cost and also does not want the continuous glucose monitoring  She has only one reading after lunch which was relatively high; however does not know how to explain a reading of 43 around 5:30 PM  She does try to stay active and exercise with walking but this does not cause low sugars, may feel hypoglycemia if she is doing a lot of gardening and outside activities  She had high readings after her we checked me when she was told not to take any insulin in the morning  Her A1c is consistently over 8% and recently over 9% consistently  DIET: May have unbalanced meals like Pop tarts, waffles or breakfast cereal.  May have high fat meats at meals    Glucometer: One Touch ultra 2          Blood Glucose readings from download:  Mean values apply above for all meters except median for One Touch  PRE-MEAL Fasting Lunch  3-6 PM  Bedtime Overall  Glucose range:  66-412   68-215   76-419   66-409    Mean/median:  272   108   150   160  187     Meals: 3 meals per day.  Eating eggs/meat for breakfast frequently without carbohydrate; dinner 6 pm         Physical activity: exercise:  trying to walk regularly in am or late pm 25-20 min         Wt Readings from Last 3 Encounters:  10/23/14 145 lb 6 oz (65.942 kg)  10/09/14 144 lb (65.318 kg)  09/19/14 144 lb 9.6 oz (57.26 kg)   Complications: Retinopathy  Lab Results  Component Value Date   HGBA1C 8.8 10/23/2014   HGBA1C 9.1* 06/22/2014   HGBA1C 9.8* 03/24/2014   Lab Results  Component Value Date   MICROALBUR 19.5* 08/15/2013   LDLCALC 110* 08/15/2013   CREATININE 1.00 03/24/2014     Lab Results  Component Value Date   MICROALBUR 19.5* 08/15/2013      Medication List       This list is accurate as of: 10/23/14  3:01 PM.  Always use your most recent med list.               amLODipine-olmesartan 5-20 MG per tablet  Commonly known as:  AZOR  Take 1 tablet by mouth daily.     ciprofloxacin 0.3 % ophthalmic solution  Commonly known as:  CILOXAN     glucose blood test strip  Commonly known as:  ONE TOUCH ULTRA TEST  Test 7 times daily dx code 250.03     HUMALOG KWIKPEN 100 UNIT/ML KiwkPen  Generic drug:  insulin lispro  INJECT 8 UNITS SUBCUTANEOUSLY THREE TIMES DAILY     hydrOXYzine 25 MG capsule  Commonly known as:  VISTARIL  Take 1 capsule (25 mg total) by mouth at bedtime as needed (if one capsule is not effective, can take two at bedtime).     Insulin Glargine 100 UNIT/ML Solostar Pen  Commonly known as:  LANTUS SOLOSTAR  Inject 16 units in the am and 24 in the evening.     Insulin Pen Needle 32G X 4 MM Misc  Commonly known as:  BD PEN NEEDLE NANO U/F  Use to inject insulin as directed     PARoxetine 12.5 MG 24 hr tablet  Commonly known as:  PAXIL-CR  Take 1 tablet (12.5 mg total) by mouth daily.     pravastatin 20 MG tablet  Commonly known as:  PRAVACHOL  TAKE 1 TABLET BY MOUTH ONCE A DAY     prednisoLONE acetate 1 % ophthalmic suspension  Commonly known as:  PRED FORTE     VIGAMOX 0.5 % ophthalmic solution  Generic drug:  moxifloxacin        Allergies:  Allergies  Allergen Reactions  . Penicillins   . Sulfa Antibiotics     Past Medical History  Diagnosis Date  . Depression   . Diabetes mellitus without complication     Type I  . Anxiety   . Generalized anxiety disorder   . Major depressive disorder, single episode, moderate   . Hypertension   . Diabetes mellitus type I   . Heart murmur   . GERD (gastroesophageal reflux disease)     Past Surgical History  Procedure Laterality Date  . Tonsillectomy    .  Abdominal hysterectomy    . Tonsillectomy    . Shoulder surgery    . Pars plana vitrectomy Left 10/11/2014    Procedure: PARS PLANA VITRECTOMY WITH 25 GAUGE,CATARACT EXTRACTION WITH IOL INSERTION ,  ENDOLASER;  Surgeon: Milus Height, MD;  Location: [facility] ORS;  Service: Ophthalmology;  Laterality: Left;  [place] AP5  CDE CASETTE LOT # [id number] h    Family History  Problem Relation Age of Onset  . Thyroid disease Mother   . Breast cancer Mother   . Hypertension Mother   . Depression Mother   . Anxiety disorder Mother     Social History:  reports that she has never smoked. She has never used smokeless tobacco. She reports that she does not drink alcohol or use illicit drugs.  Review of Systems:  HYPERTENSION:  she has had long-standing hypertension treated with Azor.  Does not check blood pressure at home Blood pressure well controlled recently, no lightheadedness  Has history of hyperlipidemia   She is taking pravastatin 20 mg   No side effects with this   Lab Results  Component Value Date   CHOL 193 08/15/2013   HDL 68.40 08/15/2013   LDLCALC 110* 08/15/2013   LDLDIRECT 88.8 12/22/2013   TRIG 71.0 08/15/2013   CHOLHDL 3 08/15/2013    She does take Protonix for reflux/heartburn  She has had mild long-standing depression controlled with Paxiland followed by psychiatrist  Thyroid level normal in 2015      Examination:   BP 122/58 mmHg  Pulse 78  Temp(Src) 98.6 F (37 C)  Ht 5\' 2"  (1.575 m)  Wt 145 lb 6 oz (65.942 kg)  BMI 26.58 kg/m2  SpO2 94%  Body mass index is 26.58 kg/(m^2).    ASSESSMENT/ PLAN:   Diabetes type 1   Her blood sugars have been poorly controlled with her A1c around 9% consistently, slightly better today See history of present illness for current problems identified in blood sugar patterns and her current management. With increasing her Lantus further her morning sugars are better However it appears that most likely her high readings in the  mornings are related to high fat meals the night before She does not understand the need for taking extra insulin for higher fat meals and will not usually checked postprandial readings when eating out or eating higher fat meals She has fair control of her blood sugars after breakfast and lunch with some variability  PLAN:    More consistent glucose monitoring after meals  Go up at least 3-5 units for higher fat meals and also take additional insulin if blood sugars are higher postprandial he  Try to take insulin before eating less not sure how much he is eating  May reduce insulin by 1-2 units for smaller or lower carbohydrate low fat meals at lunch or supper  Increase Lantus in the evening only fasting readings consistently high  More consistent balanced meals and avoid high glycemic index foods high fat meals  HYPERTENSION: Her blood pressure is well-controlled  HYPERLIPIDEMIA: This has been mild but recommended continuing pravastatin for cardiovascular prophylaxis Needs follow-up lipid  Recommended influenza vaccine but she wants to wait   Patient Instructions  Lantus 25 units in pm, 16 in am  Must take 3-6 more Humalog at lunch or supper for hi fat meals; try taking 5-10 min before meals  More sugars at night    Counseling time on subjects discussed above is over 50% of today's 25 minute visit  Raesha Coonrod 10/23/2014, 3:01 PM

## 2014-11-01 ENCOUNTER — Other Ambulatory Visit: Payer: Self-pay | Admitting: Endocrinology

## 2014-11-09 ENCOUNTER — Other Ambulatory Visit: Payer: Self-pay | Admitting: Obstetrics and Gynecology

## 2014-11-09 DIAGNOSIS — Z1231 Encounter for screening mammogram for malignant neoplasm of breast: Secondary | ICD-10-CM

## 2014-11-10 ENCOUNTER — Ambulatory Visit
Admission: RE | Admit: 2014-11-10 | Discharge: 2014-11-10 | Disposition: A | Discharge status: Home / Self Care | Payer: Managed Care, Other (non HMO) | Source: Ambulatory Visit | Attending: Obstetrics and Gynecology | Admitting: Obstetrics and Gynecology

## 2014-11-10 DIAGNOSIS — Z1231 Encounter for screening mammogram for malignant neoplasm of breast: Secondary | ICD-10-CM

## 2014-11-20 LAB — LIPID PANEL
HDL: 49 mg/dL (ref 35–70)
LDL Cholesterol: 86 mg/dL

## 2014-11-20 LAB — HEMOGLOBIN A1C: Hgb A1c MFr Bld: 8.9 % — AB (ref 4.0–6.0)

## 2014-11-24 ENCOUNTER — Encounter: Payer: Self-pay | Admitting: *Deleted

## 2014-11-24 LAB — HM DIABETES EYE EXAM

## 2014-12-25 ENCOUNTER — Other Ambulatory Visit: Payer: Self-pay | Admitting: Endocrinology

## 2014-12-25 ENCOUNTER — Telehealth: Payer: Self-pay | Admitting: Endocrinology

## 2014-12-25 NOTE — Telephone Encounter (Signed)
2 boxes (30) ml was sent earlier today.

## 2014-12-25 NOTE — Telephone Encounter (Signed)
Pt asking for 2 boxes of the insulin her pharmacy has requested the refill on please

## 2015-01-09 ENCOUNTER — Other Ambulatory Visit: Payer: Self-pay | Admitting: Endocrinology

## 2015-01-15 ENCOUNTER — Other Ambulatory Visit: Payer: Managed Care, Other (non HMO)

## 2015-01-15 ENCOUNTER — Ambulatory Visit: Payer: Managed Care, Other (non HMO) | Admitting: Endocrinology

## 2015-01-16 ENCOUNTER — Encounter: Payer: Self-pay | Admitting: Psychiatry

## 2015-01-16 ENCOUNTER — Ambulatory Visit (INDEPENDENT_AMBULATORY_CARE_PROVIDER_SITE_OTHER): Payer: Managed Care, Other (non HMO) | Admitting: Psychiatry

## 2015-01-16 VITALS — BP 140/60 | HR 94 | Temp 99.0°F | Ht 62.0 in | Wt 149.4 lb

## 2015-01-16 DIAGNOSIS — F321 Major depressive disorder, single episode, moderate: Secondary | ICD-10-CM | POA: Diagnosis not present

## 2015-01-16 DIAGNOSIS — F411 Generalized anxiety disorder: Secondary | ICD-10-CM

## 2015-01-16 MED ORDER — HYDROXYZINE PAMOATE 25 MG PO CAPS: 25.0000 mg | ORAL_CAPSULE | Freq: Every evening | ORAL | Status: DC | PRN

## 2015-01-16 MED ORDER — PAROXETINE HCL ER 12.5 MG PO TB24: 12.5000 mg | ORAL_TABLET | Freq: Every day | ORAL | Status: DC

## 2015-01-16 NOTE — Progress Notes (Signed)
[facility] MD/PA/NP OP Progress Note  01/16/2015 11:06 AM Diana Dixon  MRN:  [id number]  Subjective:  Patient returns for follow-up of her major depressive disorder and generalized anxiety disorder. Despite the patient stating she is still unemployed she states that she is able to have a good mood. She states that she likes to paint so she'll go outside and paint her flower pots. She states she has a sick uncle and she cooks for him twice a week and that has been a good activity for her. She states that the Vistaril she received at the last visit has been helpful for occasional insomnia. She states she might use it once per week. He typically only needs 25 mg.  Did discuss with her that I will be departing the practice in February. However that she can expect she'll transition to the next provider. Chief Complaint:  Chief Complaint    Follow-up; Medication Refill     Visit Diagnosis:     ICD-9-CM ICD-10-CM   1. GAD (generalized anxiety disorder) 300.02 F41.1   2. Major depressive disorder, single episode, moderate (HCC) 296.22 F32.1     Past Medical History:  Past Medical History  Diagnosis Date  . Depression   . Diabetes mellitus without complication (HCC)     Type I  . Anxiety   . Generalized anxiety disorder   . Major depressive disorder, single episode, moderate ([place])   . Hypertension   . Diabetes mellitus type I ([place])   . Heart murmur   . GERD (gastroesophageal reflux disease)     Past Surgical History  Procedure Laterality Date  . Tonsillectomy    . Abdominal hysterectomy    . Tonsillectomy    . Shoulder surgery    . Pars plana vitrectomy Left 10/11/2014    Procedure: PARS PLANA VITRECTOMY WITH 25 GAUGE,CATARACT EXTRACTION WITH IOL INSERTION , ENDOLASER;  Surgeon: Milus Height, MD;  Location: [facility] ORS;  Service: Ophthalmology;  Laterality: Left;  [place] AP5  CDE CASETTE LOT # [id number] h   Family History:  Family History  Problem Relation Age of Onset  . Thyroid disease  Mother   . Breast cancer Mother 36  . Hypertension Mother   . Depression Mother   . Anxiety disorder Mother   . Breast cancer Maternal Aunt 10  . Breast cancer Cousin 1   Social History:  Social History   Social History  . Marital Status: Widowed    Spouse Name: N/A  . Number of Children: N/A  . Years of Education: N/A   Social History Main Topics  . Smoking status: Never Smoker   . Smokeless tobacco: Never Used  . Alcohol Use: No  . Drug Use: No  . Sexual Activity:    Partners: Male   Other Topics Concern  . None   Social History Narrative   Regular exercise: walk   Caffeine use: sodas   Additional History:   Assessment:   Musculoskeletal: Strength & Muscle Tone: within normal limits Gait & Station: normal Patient leans: N/A  Psychiatric Specialty Exam: HPI  Review of Systems  Psychiatric/Behavioral: Negative for depression, suicidal ideas, hallucinations, memory loss and substance abuse. The patient has insomnia. The patient is not nervous/anxious.   All other systems reviewed and are negative.   Blood pressure 140/60, pulse 94, temperature 99 F (37.2 C), temperature source Tympanic, height 5\' 2"  (1.575 m), weight 149 lb 6.4 oz (67.767 kg), SpO2 94 %.Body mass index is 27.32 kg/(m^2).  General Appearance: Meticulous, Neat  and Well Groomed  Eye Contact:  Good  Speech:  Normal Rate  Volume:  Normal  Mood:  Good  Affect:  Congruent smiling  Thought Process:  Linear and Logical  Orientation:  Full (Time, Place, and Person)  Thought Content:  Negative  Suicidal Thoughts:  No  Homicidal Thoughts:  No  Memory:  Immediate;   Good Recent;   Good Remote;   Good  Judgement:  Good  Insight:  Good  Psychomotor Activity:  Negative  Concentration:  Good  Recall:  Corder of Knowledge: Good  Language: Good  Akathisia:  Negative  Handed:  Right  AIMS (if indicated):  N/A  Assets:  Communication Skills Desire for Improvement Leisure Time  ADL's:   Intact  Cognition: WNL  Sleep:  Fair   Is the patient at risk to self?  No. Has the patient been a risk to self in the past 6 months?  No. Has the patient been a risk to self within the distant past?  No. Is the patient a risk to others?  No. Has the patient been a risk to others in the past 6 months?  No. Has the patient been a risk to others within the distant past?  No.  Current Medications: Current Outpatient Prescriptions  Medication Sig Dispense Refill  . amLODipine-olmesartan (AZOR) 5-20 MG per tablet Take 1 tablet by mouth daily. 90 tablet 1  . BD PEN NEEDLE NANO U/F 32G X 4 MM MISC USE TO INJECT INSULIN AS DIRECTED 200 each 3  . ciprofloxacin (CILOXAN) 0.3 % ophthalmic solution     . HUMALOG KWIKPEN 100 UNIT/ML KiwkPen INJECT 8 UNITS SUBCUTANEOUSLY THREE TIMES DAILY 30 mL 1  . hydrOXYzine (VISTARIL) 25 MG capsule Take 1 capsule (25 mg total) by mouth at bedtime as needed (if one capsule is not effective, can take two at bedtime). 60 capsule 4  . Insulin Glargine (LANTUS SOLOSTAR) 100 UNIT/ML Solostar Pen Inject 16 units in the am and 24 in the evening. 45 mL 1  . ONE TOUCH ULTRA TEST test strip USE A DIRECTED 7 TIMES A DAY TO CHECK BLOOD SUGAR 300 each 3  . pantoprazole (PROTONIX) 40 MG tablet TAKE 1 TABLET BY MOUTH ONCE A DAY 30 tablet 5  . PARoxetine (PAXIL-CR) 12.5 MG 24 hr tablet Take 1 tablet (12.5 mg total) by mouth daily. 30 tablet 4  . pravastatin (PRAVACHOL) 20 MG tablet TAKE 1 TABLET BY MOUTH ONCE A DAY 30 tablet 5  . prednisoLONE acetate (PRED FORTE) 1 % ophthalmic suspension     . VIGAMOX 0.5 % ophthalmic solution      No current facility-administered medications for this visit.    Medical Decision Making:  Review of Medication Regimen & Side Effects (2) and Review of New Medication or Change in Dosage (2)  Treatment Plan Summary:Medication management and Plan   She continues to remain stable on her Paxil CR. We will continue her Paxil CR 12.5 mg daily. I will  we'll write her for some Vistaril 25-50 mg at bedtime as needed for insomnia.    Patient will continue her follow-up every 4 months however I've encouraged call with any questions or concerns prior to her next appointment.   Faith Rogue 01/16/2015, 11:06 AM

## 2015-01-17 ENCOUNTER — Ambulatory Visit (INDEPENDENT_AMBULATORY_CARE_PROVIDER_SITE_OTHER): Payer: Managed Care, Other (non HMO) | Admitting: Podiatry

## 2015-01-17 ENCOUNTER — Ambulatory Visit (INDEPENDENT_AMBULATORY_CARE_PROVIDER_SITE_OTHER): Payer: Managed Care, Other (non HMO) | Admitting: Endocrinology

## 2015-01-17 ENCOUNTER — Encounter: Payer: Self-pay | Admitting: Podiatry

## 2015-01-17 ENCOUNTER — Encounter: Payer: Self-pay | Admitting: Endocrinology

## 2015-01-17 VITALS — BP 132/62 | HR 83 | Temp 98.4°F | Resp 14 | Ht 62.0 in | Wt 148.2 lb

## 2015-01-17 DIAGNOSIS — B351 Tinea unguium: Secondary | ICD-10-CM

## 2015-01-17 DIAGNOSIS — L600 Ingrowing nail: Secondary | ICD-10-CM

## 2015-01-17 DIAGNOSIS — I10 Essential (primary) hypertension: Secondary | ICD-10-CM

## 2015-01-17 DIAGNOSIS — IMO0002 Reserved for concepts with insufficient information to code with codable children: Secondary | ICD-10-CM

## 2015-01-17 DIAGNOSIS — E1065 Type 1 diabetes mellitus with hyperglycemia: Secondary | ICD-10-CM | POA: Diagnosis not present

## 2015-01-17 LAB — MICROALBUMIN / CREATININE URINE RATIO
CREATININE, U: 119.2 mg/dL
Microalb Creat Ratio: 0.6 mg/g (ref 0.0–30.0)
Microalb, Ur: <0.7 mg/dL (ref 0.0–1.9)

## 2015-01-17 NOTE — Progress Notes (Signed)
Patient ID: Diana Dixon, female   DOB: January 11, 1961, 54 y.o.   MRN: [id number]   Reason for Appointment: Diabetes follow-up   History of Present Illness   Diagnosis: Type 1 DIABETES MELITUS, diagnosis made in 1967      She has had long-standing type 1 diabetes with persistently poor control because of variability in blood sugars and difficulty with various self-care issues She has been reluctant to do carbohydrate counting to adjust her insulin doses and has variable readings after meals especially lunch Previously has had times when she would forget her Lantus in the evening and also mealtime dose She may have done a little better with taking Lantus twice a day but does not try to adjust the doses on her own    Recent history:  Insulin regimen: Lantus 16---24, Humalog 7 units at breakfast and 8-9 units at lunch and supper 5-6 hs  She has had persistently poor control of her diabetes despite frequently advising her on insulin changes and better compliance for day-to-day management of diet, insulin regimen and glucose monitoring A1c was done a couple months ago by PCP and was similar to previous level of 8.8 She was recommended Toujeo by this was not covered by her insurance  Current blood sugar patterns and problems identified:  FASTING blood sugars are mostly high but fluctuating significantly  She has not changed her Lantus insulin last visit but has started taking 5-6 units of Humalog with her bedtime snack even though at this is only.  Butter crackers usually  Has had only 3 readings in the 60s in the last month fasting but has several readings over 200 also, appear to be relatively higher in the last 10 days  She is checking blood sugar very sporadically later in the day and usually not before supper  She has a few unusually high readings late in the evenings probably related to poor diet and higher fat meals  She continues to consume regular soft drinks and  drinks which sugar including this morning  Her breakfast meals are generally unbalanced and higher in carbohydrate without any protein usually  She again does not want to change her insulin since she has enough supplies for the next 2 months and newer insulins are not covered on her current insurance  DIET: May have unbalanced meals like Pop tarts, waffles or breakfast cereal.  May have high fat meats at meals    Glucometer: One Touch ultra 2          Blood Glucose readings from download:  Mean values apply above for all meters except median for One Touch  PRE-MEAL Fasting Lunch  3-6 PM  Bedtime Overall  Glucose range:  61-319   55-217   57-158   86-425    Mean/median:  150   151    186   145     Meals: 3 meals per day.  Eating eggs/meat for breakfast frequently without carbohydrate; dinner 6 pm         Physical activity: exercise:  trying to walk regularly in am or late pm 25-20 min         Wt Readings from Last 3 Encounters:  01/17/15 148 lb 3.2 oz (67.223 kg)  01/16/15 149 lb 6.4 oz (67.767 kg)  10/23/14 145 lb 6 oz ([id number] kg)   Complications: Retinopathy  Lab Results  Component Value Date   HGBA1C 8.9* 11/20/2014   HGBA1C 8.8 10/23/2014   HGBA1C 9.1* 06/22/2014  Lab Results  Component Value Date   MICROALBUR <0.7 01/17/2015   [place] 86 11/20/2014   CREATININE 1.00 03/24/2014    Lab Results  Component Value Date   MICROALBUR <0.7 01/17/2015      Medication List       This list is accurate as of: 01/17/15  9:30 PM.  Always use your most recent med list.               amLODipine-olmesartan 5-20 MG tablet  Commonly known as:  AZOR  Take 1 tablet by mouth daily.     BD PEN NEEDLE NANO U/F 32G X 4 MM Misc  Generic drug:  Insulin Pen Needle  USE TO INJECT INSULIN AS DIRECTED     ciprofloxacin 0.3 % ophthalmic solution  Commonly known as:  CILOXAN     HUMALOG KWIKPEN 100 UNIT/ML KiwkPen  Generic drug:  insulin lispro  INJECT 8 UNITS SUBCUTANEOUSLY  THREE TIMES DAILY     hydrOXYzine 25 MG capsule  Commonly known as:  VISTARIL  Take 1 capsule (25 mg total) by mouth at bedtime as needed (if one capsule is not effective, can take two at bedtime).     Insulin Glargine 100 UNIT/ML Solostar Pen  Commonly known as:  LANTUS SOLOSTAR  Inject 16 units in the am and 24 in the evening.     ONE TOUCH ULTRA TEST test strip  Generic drug:  glucose blood  USE A DIRECTED 7 TIMES A DAY TO CHECK BLOOD SUGAR     pantoprazole 40 MG tablet  Commonly known as:  PROTONIX  TAKE 1 TABLET BY MOUTH ONCE A DAY     PARoxetine 12.5 MG 24 hr tablet  Commonly known as:  PAXIL-CR  Take 1 tablet (12.5 mg total) by mouth daily.     pravastatin 20 MG tablet  Commonly known as:  PRAVACHOL  TAKE 1 TABLET BY MOUTH ONCE A DAY     prednisoLONE acetate 1 % ophthalmic suspension  Commonly known as:  PRED FORTE     VIGAMOX 0.5 % ophthalmic solution  Generic drug:  moxifloxacin        Allergies:  Allergies  Allergen Reactions  . Penicillins   . Sulfa Antibiotics     Past Medical History  Diagnosis Date  . Depression   . Diabetes mellitus without complication (HCC)     Type I  . Anxiety   . Generalized anxiety disorder   . Major depressive disorder, single episode, moderate ([place])   . Hypertension   . Diabetes mellitus type I ([place])   . Heart murmur   . GERD (gastroesophageal reflux disease)     Past Surgical History  Procedure Laterality Date  . Tonsillectomy    . Abdominal hysterectomy    . Tonsillectomy    . Shoulder surgery    . Pars plana vitrectomy Left 10/11/2014    Procedure: PARS PLANA VITRECTOMY WITH 25 GAUGE,CATARACT EXTRACTION WITH IOL INSERTION , ENDOLASER;  Surgeon: Milus Height, MD;  Location: [facility] OR[facility];  Service: Ophthalmology;  Laterality: Left;  [place] AP5  CDE CASETTE LOT # [id number] h    Family History  Problem Relation Age of Onset  . Thyroid disease Mother   . Breast cancer Mother 10  . Hypertension Mother   .  Depression Mother   . Anxiety disorder Mother   . Breast cancer Maternal Aunt 27  . Breast cancer Cousin 5    Social History:  reports that she has never smoked. She has  never used smokeless tobacco. She reports that she does not drink alcohol or use illicit drugs.  Review of Systems:  HYPERTENSION:  she has had long-standing hypertension treated with Azor.  Does not check blood pressure at home Blood pressure well controlled usually  Has history of hyperlipidemia   She is taking pravastatin 20 mg without any side effects and fairly good control     Lab Results  Component Value Date   CHOL 193 08/15/2013   HDL 49 11/20/2014   LDLCALC 86 11/20/2014   LDLDIRECT 88.8 12/22/2013   TRIG 71.0 08/15/2013   CHOLHDL 3 08/15/2013    She does take Protonix for reflux/heartburn  She has had mild long-standing depression controlled with Paxiland followed by psychiatrist       Examination:   BP 132/62 mmHg  Pulse 83  Temp(Src) 98.4 F (36.9 C)  Resp 14  Ht 5\' 2"  (1.575 m)  Wt 148 lb 3.2 oz (67.223 kg)  BMI 27.10 kg/m2  SpO2 93%  Body mass index is 27.1 kg/(m^2).    ASSESSMENT/ PLAN:   Diabetes type 1   Her blood sugars have been poorly controlled with her A1c around 9% including the last 2 measurements, last checked in October See history of present illness for current problems identified in blood sugar patterns and her current management. She has marked fluctuation in her fasting readings probably related to inconsistent action of Lantus She does appear to do better with adding Humalog at bedtime with her snack However occasionally may wake up with low normal sugars  POSTPRANDIAL readings are usually higher when she is not covering her higher fat door high carbohydrate meals adequately including today at breakfast; highest blood sugar at home is 425 after supper She refuses to change her eating patterns and cannot stop drinks which sugar  PLAN:    More consistent  glucose monitoring after meals  Discussed postprandial blood sugar targets and we will need to have her increase her mealtime coverage to keep these under control  She will try to get coverage for regular Tresiba her Toujeo when she has new insurance program in January  Consider continuous glucose monitoring before her next visit  More consistent balanced meals and avoid high glycemic index foods high fat meals  HYPERTENSION: Her blood pressure is well-controlled  HYPERLIPIDEMIA: This has been mild but recommended continuing pravastatin for cardiovascular prophylaxis    Patient Instructions  Check coverage for [place] Aas and Toujeo  Check blood sugars on waking up 5-7  times a week Also check blood sugars about 2 hours after a meal and do this after different meals by rotation  Recommended blood sugar levels on waking up is 90-130 and about 2 hours after meal is 130-160  Please bring your blood sugar monitor to each visit, thank you       Counseling time on subjects discussed above is over 50% of today's 25 minute visit  Wrenley Sayed 01/17/2015, 9:30 PM

## 2015-01-17 NOTE — Patient Instructions (Addendum)
Check coverage for Tresiba and Toujeo  Check blood sugars on waking up 5-7  times a week Also check blood sugars about 2 hours after a meal and do this after different meals by rotation  Recommended blood sugar levels on waking up is 90-130 and about 2 hours after meal is 130-160  Please bring your blood sugar monitor to each visit, thank you

## 2015-01-17 NOTE — Progress Notes (Signed)
   Subjective:    Patient ID: Diana Dixon, female    DOB: 1961/01/29, 54 y.o.   MRN: [id number]  HPI Pt presents with left great hallux nail discoloration.    Review of Systems  All other systems reviewed and are negative.      Objective:   Physical Exam        Assessment & Plan:

## 2015-01-18 ENCOUNTER — Telehealth: Payer: Self-pay | Admitting: Endocrinology

## 2015-01-18 NOTE — Telephone Encounter (Signed)
Patient no showed today's appt. Please advise on how to follow up. °A. No follow up necessary. °B. Follow up urgent. Contact patient immediately. °C. Follow up necessary. Contact patient and schedule visit in ___ days. °D. Follow up advised. Contact patient and schedule visit in ____weeks. ° °

## 2015-01-18 NOTE — Telephone Encounter (Signed)
sorry

## 2015-01-18 NOTE — Progress Notes (Signed)
Subjective:     Patient ID: Diana Dixon, female   DOB: November 19, 1960, 54 y.o.   MRN: [id number]  HPI patient presents with concerns about her left big toenail that it's been slightly loose and discolored but it's been this way for a number of years   Review of Systems  All other systems reviewed and are negative.      Objective:   Physical Exam  Constitutional: She is oriented to person, place, and time.  Cardiovascular: Intact distal pulses.   Musculoskeletal: Normal range of motion.  Neurological: She is oriented to person, place, and time.  Skin: Skin is warm.  Nursing note and vitals reviewed.  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 Explain what to do with further looseness were to occur drainage redness and at that time we'll have to reevaluate

## 2015-01-18 NOTE — Telephone Encounter (Signed)
Diana Dixon,  This is your patient. Thanks! 

## 2015-01-18 NOTE — Telephone Encounter (Signed)
error 

## 2015-01-22 ENCOUNTER — Ambulatory Visit: Payer: Managed Care, Other (non HMO) | Admitting: Endocrinology

## 2015-01-22 ENCOUNTER — Other Ambulatory Visit: Payer: Managed Care, Other (non HMO)

## 2015-01-23 ENCOUNTER — Telehealth: Payer: Self-pay | Admitting: *Deleted

## 2015-01-23 ENCOUNTER — Encounter: Payer: Self-pay | Admitting: Endocrinology

## 2015-01-23 NOTE — Telephone Encounter (Signed)
Patient sent an email, she wants to know if the results of her urine was good or bad? Please advise.

## 2015-01-23 NOTE — Telephone Encounter (Signed)
Normal

## 2015-03-02 ENCOUNTER — Other Ambulatory Visit: Payer: Self-pay | Admitting: Endocrinology

## 2015-04-20 ENCOUNTER — Ambulatory Visit: Payer: Managed Care, Other (non HMO) | Admitting: Endocrinology

## 2015-05-17 ENCOUNTER — Ambulatory Visit: Payer: Self-pay | Admitting: Psychiatry

## 2015-05-21 ENCOUNTER — Other Ambulatory Visit: Payer: Self-pay | Admitting: *Deleted

## 2015-05-21 ENCOUNTER — Telehealth: Payer: Self-pay | Admitting: Endocrinology

## 2015-05-21 DIAGNOSIS — E103593 Type 1 diabetes mellitus with proliferative diabetic retinopathy without macular edema, bilateral: Secondary | ICD-10-CM | POA: Diagnosis not present

## 2015-05-21 LAB — HM DIABETES EYE EXAM

## 2015-05-21 MED ORDER — AMLODIPINE-OLMESARTAN 5-20 MG PO TABS: 1.0000 | ORAL_TABLET | Freq: Every day | ORAL | Status: DC

## 2015-05-21 NOTE — Telephone Encounter (Signed)
rx sent

## 2015-05-21 NOTE — Telephone Encounter (Signed)
Refill needed for blood pressure pills called to [facility]

## 2015-05-22 ENCOUNTER — Encounter: Payer: Self-pay | Admitting: *Deleted

## 2015-06-04 ENCOUNTER — Other Ambulatory Visit: Payer: Self-pay | Admitting: *Deleted

## 2015-06-04 ENCOUNTER — Telehealth: Payer: Self-pay | Admitting: Endocrinology

## 2015-06-04 MED ORDER — BAYER CONTOUR NEXT MONITOR W/DEVICE KIT: PACK | Status: DC

## 2015-06-04 MED ORDER — BAYER MICROLET LANCETS MISC: Status: DC

## 2015-06-04 MED ORDER — GLUCOSE BLOOD VI STRP
ORAL_STRIP | Status: DC
Start: 2015-06-04 — End: 2015-12-05

## 2015-06-04 NOTE — Telephone Encounter (Signed)
rx sent

## 2015-06-04 NOTE — Telephone Encounter (Signed)
Pharmacy called and said that PT's insurance will no longer cover the One Touch Ultra and that they prefer the Contour Next, she needs a new Meter, Strips, and Lancets sent in to them Fax # [phone]

## 2015-06-15 ENCOUNTER — Ambulatory Visit (INDEPENDENT_AMBULATORY_CARE_PROVIDER_SITE_OTHER): Payer: BLUE CROSS/BLUE SHIELD | Admitting: Endocrinology

## 2015-06-15 ENCOUNTER — Encounter: Payer: Self-pay | Admitting: Endocrinology

## 2015-06-15 VITALS — BP 144/52 | HR 97 | Temp 98.5°F | Ht 62.5 in | Wt 150.0 lb

## 2015-06-15 DIAGNOSIS — E1065 Type 1 diabetes mellitus with hyperglycemia: Secondary | ICD-10-CM | POA: Diagnosis not present

## 2015-06-15 DIAGNOSIS — I10 Essential (primary) hypertension: Secondary | ICD-10-CM | POA: Diagnosis not present

## 2015-06-15 MED ORDER — INSULIN DEGLUDEC 100 UNIT/ML ~~LOC~~ SOPN: 40.0000 [IU] | PEN_INJECTOR | Freq: Every day | SUBCUTANEOUS | Status: DC

## 2015-06-15 NOTE — Patient Instructions (Addendum)
Lantus 18 in am and 28 at nite until going on Tresiba  Tresiba 40 once daily and go up 2 units every 4-5 days till am sugar <150  More sugars at dinner/bedtime

## 2015-06-15 NOTE — Progress Notes (Signed)
Patient ID: Diana Dixon, female   DOB: 04/02/1960, 55 y.o.   MRN: [id number]   Reason for Appointment: Diabetes follow-up   History of Present Illness   Diagnosis: Type 1 DIABETES MELITUS, diagnosis made in 1967      She has had long-standing type 1 diabetes with persistently poor control because of variability in blood sugars and difficulty with various self-care issues She has been reluctant to do carbohydrate counting to adjust her insulin doses and has variable readings after meals especially lunch Previously has had times when she would forget her Lantus in the evening and also mealtime dose She may have done a little better with taking Lantus twice a day but does not try to adjust the doses on her own    Recent history:  Insulin regimen: Lantus 16---25, Humalog 7 units at breakfast and 8-9 units at lunch and supper 5-6 hs  She has had persistently poor control of her diabetes despite frequently advising her on insulin changes and better compliance for day-to-day management of diet, insulin regimen and glucose monitoring A1c was done a couple months ago by PCP and was similar to previous level of 8.8 She was recommended Toujeo by this was not covered by her insurance  Current blood sugar patterns and problems identified:  FASTING blood sugars are mostly high but fluctuating significantly, occasionally around 400  She has increased her Lantus only by 1 unit since her last visit in the evening  However blood sugars in the mornings are mostly high recently  She continues to consume regular soft drinks and drinks with sugar   Her breakfast meals are generally unbalanced and higher in carbohydrate without any protein usually  Most of her readings at lunchtime are also high except on a couple of occasions.  She thinks she takes extra insulin for higher readings, usually 3-4 units but this does not always bring her blood sugar down  DIET: May have unbalanced meals  like Pop tarts, waffles or breakfast cereal.  May have high fat meats at meals.  Still drinking some regular soft drinks and sweet tea at meals    Glucometer: One Touch ultra 2          Blood Glucose readings from download:  Mean values apply above for all meters except median for One Touch  PRE-MEAL Fasting Lunch Dinner Bedtime Overall  Glucose range: 69-422 103-393 110-454 51-258   Mean/median: 184 257    190     Meals: 3 meals per day.  Eating eggs/meat for breakfast frequently without carbohydrate; dinner 6 pm         Physical activity: exercise:  trying to walk regularly in am or late pm 25-20 Minutes          Wt Readings from Last 3 Encounters:  06/15/15 150 lb (68.04 kg)  01/17/15 148 lb 3.2 oz (67.223 kg)  01/16/15 149 lb 6.4 oz (91.694 kg)   Complications: Retinopathy  Lab Results  Component Value Date   HGBA1C 8.9* 11/20/2014   HGBA1C 8.8 10/23/2014   HGBA1C 9.1* 06/22/2014   Lab Results  Component Value Date   MICROALBUR <0.7 01/17/2015   LDLCALC 86 11/20/2014   CREATININE 1.00 03/24/2014    Lab Results  Component Value Date   MICROALBUR <0.7 01/17/2015      Medication List       This list is accurate as of: 06/15/15 11:59 PM.  Always use your most recent med list.  amLODipine-olmesartan 5-20 MG tablet  Commonly known as:  AZOR  Take 1 tablet by mouth daily.     BAYER CONTOUR NEXT MONITOR w/Device Kit  Use to check blood sugar 7 times per day dx code 10.9     BAYER MICROLET LANCETS lancets  Use as instructed to check blood sugar 7 times per day dx code E10.9     BD PEN NEEDLE NANO U/F 32G X 4 MM Misc  Generic drug:  Insulin Pen Needle  USE TO INJECT INSULIN AS DIRECTED     ciprofloxacin 0.3 % ophthalmic solution  Commonly known as:  CILOXAN  Reported on 06/15/2015     glucose blood test strip  Commonly known as:  BAYER CONTOUR NEXT TEST  Use as instructed to check blood sugar 7 times per day dx code E10.9     HUMALOG KWIKPEN  100 UNIT/ML KiwkPen  Generic drug:  insulin lispro  INJECT 8 UNITS SUBCUTANEOUSLY THREE TIMES DAILY     hydrOXYzine 25 MG capsule  Commonly known as:  VISTARIL  Take 1 capsule (25 mg total) by mouth at bedtime as needed (if one capsule is not effective, can take two at bedtime).     Insulin Degludec 100 UNIT/ML Sopn  Commonly known as:  TRESIBA FLEXTOUCH  Inject 40 Units into the skin daily.     Insulin Glargine 100 UNIT/ML Solostar Pen  Commonly known as:  LANTUS SOLOSTAR  Inject 16 units in the am and 24 in the evening.     pantoprazole 40 MG tablet  Commonly known as:  PROTONIX  TAKE 1 TABLET BY MOUTH ONCE A DAY     PARoxetine 12.5 MG 24 hr tablet  Commonly known as:  PAXIL-CR  Take 1 tablet (12.5 mg total) by mouth daily.     pravastatin 20 MG tablet  Commonly known as:  PRAVACHOL  TAKE 1 TABLET BY MOUTH ONCE A DAY     prednisoLONE acetate 1 % ophthalmic suspension  Commonly known as:  PRED FORTE  Reported on 06/15/2015     VIGAMOX 0.5 % ophthalmic solution  Generic drug:  moxifloxacin  Reported on 06/15/2015        Allergies:  Allergies  Allergen Reactions  . Penicillins   . Sulfa Antibiotics     Past Medical History  Diagnosis Date  . Depression   . Diabetes mellitus without complication (HCC)     Type I  . Anxiety   . Generalized anxiety disorder   . Major depressive disorder, single episode, moderate ([place])   . Hypertension   . Diabetes mellitus type I ([place])   . Heart murmur   . GERD (gastroesophageal reflux disease)     Past Surgical History  Procedure Laterality Date  . Tonsillectomy    . Abdominal hysterectomy    . Tonsillectomy    . Shoulder surgery    . Pars plana vitrectomy Left 10/11/2014    Procedure: PARS PLANA VITRECTOMY WITH 25 GAUGE,CATARACT EXTRACTION WITH IOL INSERTION , ENDOLASER;  Surgeon: Milus Height, MD;  Location: [facility] ORS;  Service: Ophthalmology;  Laterality: Left;  [place] AP5  CDE CASETTE LOT # [id number] h    Family  History  Problem Relation Age of Onset  . Thyroid disease Mother   . Breast cancer Mother 67  . Hypertension Mother   . Depression Mother   . Anxiety disorder Mother   . Breast cancer Maternal Aunt 70  . Breast cancer Cousin 73    Social History:  reports that  she has never smoked. She has never used smokeless tobacco. She reports that she does not drink alcohol or use illicit drugs.  Review of Systems:  HYPERTENSION:  she has had long-standing hypertension treated with Azor.  Blood pressure well controlled usually  Has history of hyperlipidemia   She is taking pravastatin 20 mg without any side effects and fairly good control     Lab Results  Component Value Date   CHOL 193 08/15/2013   HDL 49 11/20/2014   LDLCALC 86 11/20/2014   LDLDIRECT 88.8 12/22/2013   TRIG 71.0 08/15/2013   CHOLHDL 3 08/15/2013     She has had mild long-standing depression controlled with Paxil and followed by psychiatrist       Examination:   BP 144/52 mmHg  Pulse 97  Temp(Src) 98.5 F (36.9 C) (Oral)  Ht 5' 2.5" (1.588 m)  Wt 150 lb (68.04 kg)  BMI 26.98 kg/m2  SpO2 95%  Body mass index is 26.98 kg/(m^2).    ASSESSMENT/ PLAN:   Diabetes type 1   Her blood sugars have been Persistently poorly controlled with her A1c around 9%  See history of present illness for current problems identified in blood sugar patterns and her current management. She has marked fluctuation in her fasting readings possibly related to [facility] phenomenon but also may be related to inconsistent action of Lantus despite taking this twice a day Fasting readings are more consistently higher on this visit Most likely needs higher dose of basal insulin although she does have some good readings in the mornings also possibly from lower fat diet the night before Although she was advised to try [place] or Toujeo not clear if these are covered again by her insurance Also she has been persistently refusing to consider an  insulin pump partly because of the cost  POSTPRANDIAL readings are usually not being checked especially in the evenings and difficult to establish pattern. She has been reluctant to do the continuous glucose monitoring either professional or personal version She did agree to do the professional continuous monitoring if she could send the sensor on her own  After some discussion patient agrees to look into the Omnipod pump and the cost especially since he does not cost as much of front  PLAN:    More consistent glucose monitoring after lunch and dinner  Continue cutting back on drinks which sugar and high carbohydrate meals  Tresiba prescription to be sent, discussed changes in her regimen with once a day program and to titrate upwards gradually as fasting readings may take time to come down with switching.  She will start with 40 units  Meanwhile she will increase her Lantus to 18 in the morning and 28 in the evenings  Occasionally check sugars overnight also  Need to adjust suppertime readings based on postprandial readings and may still need to do correction for significantly high bedtime readings if any  HYPERTENSION: Her blood pressure is fairly well-controlled     Patient Instructions  Lantus 18 in am and 28 at nite until going on Tresiba  Tresiba 40 once daily and go up 2 units every 4-5 days till am sugar <150  More sugars at dinner/bedtime    Counseling time on subjects discussed above is over 50% of today's 25 minute visit  Atonya Templer 06/18/2015, 8:07 AM

## 2015-06-18 ENCOUNTER — Telehealth: Payer: Self-pay | Admitting: Endocrinology

## 2015-06-18 NOTE — Telephone Encounter (Signed)
Call Diana Dixon to let them know why we are changing her insulin

## 2015-06-19 ENCOUNTER — Other Ambulatory Visit: Payer: Self-pay | Admitting: *Deleted

## 2015-06-19 MED ORDER — INSULIN GLARGINE 100 UNIT/ML SOLOSTAR PEN: PEN_INJECTOR | SUBCUTANEOUS | Status: DC

## 2015-06-19 NOTE — Telephone Encounter (Signed)
[employer] needs the paperwork for changing her insulin.  Pt expressed if the doctor is not going to change her insulin can she just go back on the [place].

## 2015-06-19 NOTE — Telephone Encounter (Signed)
Forms are on Dr. Ronnie Derby desk for signature

## 2015-06-25 ENCOUNTER — Telehealth: Payer: Self-pay | Admitting: Endocrinology

## 2015-06-25 NOTE — Telephone Encounter (Signed)
She needs to find out the cost of Toujeo otherwise stay on Lantus

## 2015-06-25 NOTE — Telephone Encounter (Signed)
PT called because new insulin Dr. Dwyane Dee put her on is still too expensive even with Coupon card and insurance.  She wants to know if it is totally necessary to start on this since she cannot afford that and will only be taking for a short period of time.  CB# [phone]

## 2015-06-25 NOTE — Telephone Encounter (Signed)
Please see below and advise.

## 2015-06-25 NOTE — Telephone Encounter (Signed)
Noted, she just wants to stay on the Lantus.

## 2015-07-11 ENCOUNTER — Other Ambulatory Visit: Payer: Self-pay | Admitting: Endocrinology

## 2015-07-11 ENCOUNTER — Telehealth: Payer: Self-pay | Admitting: Endocrinology

## 2015-07-11 NOTE — Telephone Encounter (Signed)
Those were sent in today at 2:05 pm.  Message left on patients voice mail letting her know.

## 2015-07-11 NOTE — Telephone Encounter (Signed)
PT called and said that [facility] was supposed to fax over request for her pen needles and she needs them sent in ASAP.  She is completely out.

## 2015-07-12 ENCOUNTER — Encounter: Payer: Self-pay | Admitting: Psychiatry

## 2015-07-12 ENCOUNTER — Ambulatory Visit (INDEPENDENT_AMBULATORY_CARE_PROVIDER_SITE_OTHER): Payer: Self-pay | Admitting: Psychiatry

## 2015-07-12 VITALS — BP 124/60 | HR 86 | Temp 98.2°F | Ht 62.5 in | Wt 152.6 lb

## 2015-07-12 DIAGNOSIS — F321 Major depressive disorder, single episode, moderate: Secondary | ICD-10-CM

## 2015-07-12 MED ORDER — PAROXETINE HCL ER 12.5 MG PO TB24: 12.5000 mg | ORAL_TABLET | Freq: Every day | ORAL | Status: DC

## 2015-07-12 NOTE — Progress Notes (Signed)
[place] MD/PA/NP OP Progress Note  07/12/2015 4:04 PM Diana Dixon  MRN:  [id number]  Subjective:  Patient is a 55 year old female who presented for follow-up. She was previously following Dr. Jimmye Norman. She reported that she has been stable on Paxil CR which was prescribed Dr. Jimmye Norman. She appeared well groomed during the interview. She reported that she feels that the medication has been helping her and she does not have any depressive symptoms. She denied having any mood swings anger anxiety or paranoia. She appeared very calm and well groomed during the interview.  Chief Complaint:   Visit Diagnosis:     ICD-9-CM ICD-10-CM   1. Major depressive disorder, single episode, moderate (HCC) 296.22 F32.1     Past Medical History:  Past Medical History  Diagnosis Date  . Depression   . Diabetes mellitus without complication (HCC)     Type I  . Anxiety   . Generalized anxiety disorder   . Major depressive disorder, single episode, moderate ([place])   . Hypertension   . Diabetes mellitus type I ([place])   . Heart murmur   . GERD (gastroesophageal reflux disease)     Past Surgical History  Procedure Laterality Date  . Tonsillectomy    . Abdominal hysterectomy    . Tonsillectomy    . Shoulder surgery    . Pars plana vitrectomy Left 10/11/2014    Procedure: PARS PLANA VITRECTOMY WITH 25 GAUGE,CATARACT EXTRACTION WITH IOL INSERTION , ENDOLASER;  Surgeon: Milus Height, MD;  Location: [facility] ORS;  Service: Ophthalmology;  Laterality: Left;  [place] AP5  CDE CASETTE LOT # [id number] h   Family History:  Family History  Problem Relation Age of Onset  . Thyroid disease Mother   . Breast cancer Mother 34  . Hypertension Mother   . Depression Mother   . Anxiety disorder Mother   . Breast cancer Maternal Aunt 27  . Breast cancer Cousin 78   Social History:  Social History   Social History  . Marital Status: Widowed    Spouse Name: N/A  . Number of Children: N/A  . Years of Education: N/A    Social History Main Topics  . Smoking status: Never Smoker   . Smokeless tobacco: Never Used  . Alcohol Use: No  . Drug Use: No  . Sexual Activity:    Partners: Male   Other Topics Concern  . Not on file   Social History Narrative   Regular exercise: walk   Caffeine use: sodas   Additional History:   Assessment:   Musculoskeletal: Strength & Muscle Tone: within normal limits Gait & Station: normal Patient leans: N/A  Psychiatric Specialty Exam: HPI  Review of Systems  Psychiatric/Behavioral: Negative for depression, suicidal ideas, hallucinations, memory loss and substance abuse. The patient has insomnia. The patient is not nervous/anxious.   All other systems reviewed and are negative.   There were no vitals taken for this visit.There is no weight on file to calculate BMI.  General Appearance: Meticulous, Neat and Well Groomed  Eye Contact:  Good  Speech:  Normal Rate  Volume:  Normal  Mood:  Good  Affect:  Congruent smiling  Thought Process:  Linear and Logical  Orientation:  Full (Time, Place, and Person)  Thought Content:  Negative  Suicidal Thoughts:  No  Homicidal Thoughts:  No  Memory:  Immediate;   Good Recent;   Good Remote;   Good  Judgement:  Good  Insight:  Good  Psychomotor Activity:  Negative  Concentration:  Good  Recall:  Smiley Houseman of Knowledge: Good  Language: Good  Akathisia:  Negative  Handed:  Right  AIMS (if indicated):  N/A  Assets:  Communication Skills Desire for Improvement Leisure Time  ADL's:  Intact  Cognition: WNL  Sleep:  Fair   Is the patient at risk to self?  No. Has the patient been a risk to self in the past 6 months?  No. Has the patient been a risk to self within the distant past?  No. Is the patient a risk to others?  No. Has the patient been a risk to others in the past 6 months?  No. Has the patient been a risk to others within the distant past?  No.  Current Medications: Current Outpatient Prescriptions   Medication Sig Dispense Refill  . amLODipine-olmesartan (AZOR) 5-20 MG tablet Take 1 tablet by mouth daily. 90 tablet 0  . BAYER MICROLET LANCETS lancets Use as instructed to check blood sugar 7 times per day dx code E10.9 200 each 3  . BD PEN NEEDLE NANO U/F 32G X 4 MM MISC USE TO INJECT INSULIN AS DIRECTED 200 each 5  . Blood Glucose Monitoring Suppl (BAYER CONTOUR NEXT MONITOR) w/Device KIT Use to check blood sugar 7 times per day dx code 10.9 1 kit 0  . ciprofloxacin (CILOXAN) 0.3 % ophthalmic solution Reported on 06/15/2015    . glucose blood (BAYER CONTOUR NEXT TEST) test strip Use as instructed to check blood sugar 7 times per day dx code E10.9 100 each 3  . HUMALOG KWIKPEN 100 UNIT/ML KiwkPen INJECT 8 UNITS SUBCUTANEOUSLY THREE TIMES DAILY 30 mL 1  . hydrOXYzine (VISTARIL) 25 MG capsule Take 1 capsule (25 mg total) by mouth at bedtime as needed (if one capsule is not effective, can take two at bedtime). (Patient not taking: Reported on 06/15/2015) 60 capsule 4  . Insulin Degludec (TRESIBA FLEXTOUCH) 100 UNIT/ML SOPN Inject 40 Units into the skin daily. 5 pen 1  . Insulin Glargine (LANTUS SOLOSTAR) 100 UNIT/ML Solostar Pen Inject 16 units in the am and 24 in the evening. 45 mL 1  . pantoprazole (PROTONIX) 40 MG tablet TAKE 1 TABLET BY MOUTH ONCE A DAY 30 tablet 5  . PARoxetine (PAXIL-CR) 12.5 MG 24 hr tablet Take 1 tablet (12.5 mg total) by mouth daily. 30 tablet 4  . pravastatin (PRAVACHOL) 20 MG tablet TAKE 1 TABLET BY MOUTH ONCE A DAY 30 tablet 5  . prednisoLONE acetate (PRED FORTE) 1 % ophthalmic suspension Reported on 06/15/2015    . VIGAMOX 0.5 % ophthalmic solution Reported on 06/15/2015     No current facility-administered medications for this visit.    Medical Decision Making:  Review of Medication Regimen & Side Effects (2) and Review of New Medication or Change in Dosage (2)  Treatment Plan Summary:Medication management and Plan    We will continue her Paxil CR 12.5 mg daily.    Patient will continue her follow-up every 3   More than 50% of the time spent in psychoeducation, counseling and coordination of care.    This note was generated in part or whole with voice recognition software. Voice regonition is usually quite accurate but there are transcription errors that can and very often do occur. I apologize for any typographical errors that were not detected and corrected.   Rainey Pines, MD  07/12/2015, 4:04 PM

## 2015-08-01 ENCOUNTER — Telehealth: Payer: Self-pay | Admitting: Internal Medicine

## 2015-08-01 ENCOUNTER — Telehealth: Payer: Self-pay | Admitting: Endocrinology

## 2015-08-01 MED ORDER — INSULIN LISPRO 100 UNIT/ML (KWIKPEN): PEN_INJECTOR | SUBCUTANEOUS | Status: DC

## 2015-08-01 NOTE — Telephone Encounter (Signed)
See below and please advise if ok to change.

## 2015-08-01 NOTE — Telephone Encounter (Signed)
Rx submitted

## 2015-08-01 NOTE — Telephone Encounter (Signed)
Yes, no pb! 

## 2015-08-01 NOTE — Telephone Encounter (Signed)
PT needs Humalog sent into [facility], she is completely out

## 2015-08-01 NOTE — Telephone Encounter (Signed)
Insurance prefers novolog over [employer] can we send in new rx to cover [phone] [employer]

## 2015-08-03 IMAGING — CT CT ABD-PELV W/ CM
1 of 3 series · 14 of 32 positions shown, 19 images · non-contrast
Comparison: none

REASON FOR EXAM: (1) generalized abdominal pain, diarrhea; (2)
generalized abdominal pain, diarrh
COMMENTS:

[Series 2: 3mm soft tissue · axial · 0.59mm/px · z∈[-936,-536]mm · 14 of 151 slices shown, 19 images]
[im 9/151  soft-tissue]
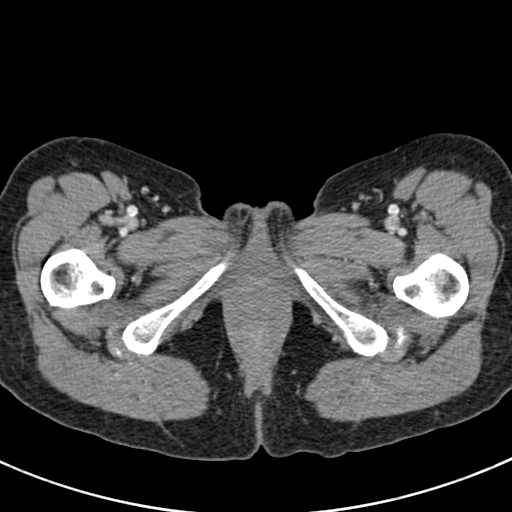
[im 9/151  bone]
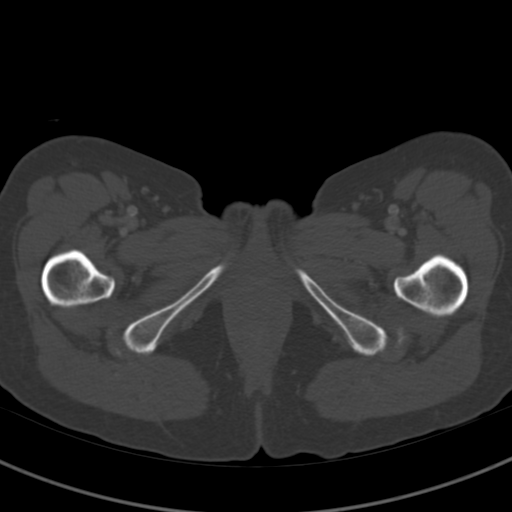
[im 18/151  soft-tissue]
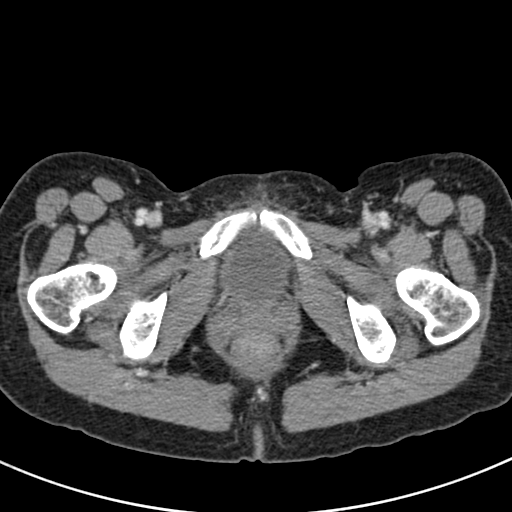
[im 36/151  soft-tissue]
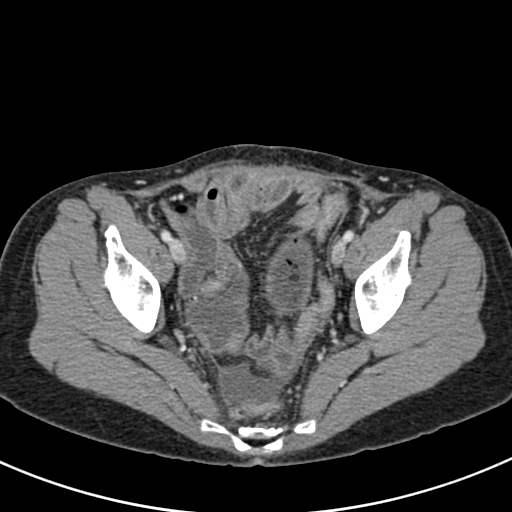
[im 45/151  soft-tissue]
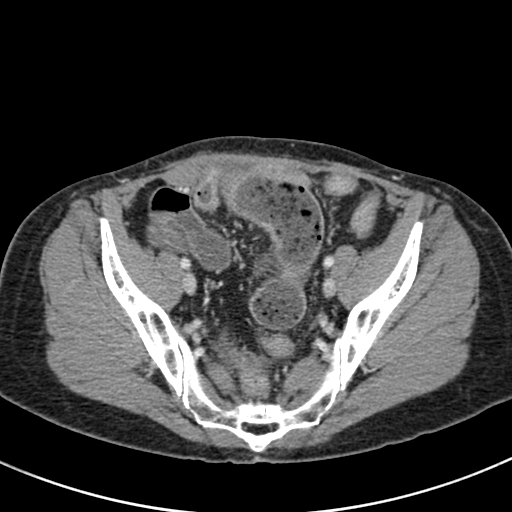
[im 53/151  soft-tissue]
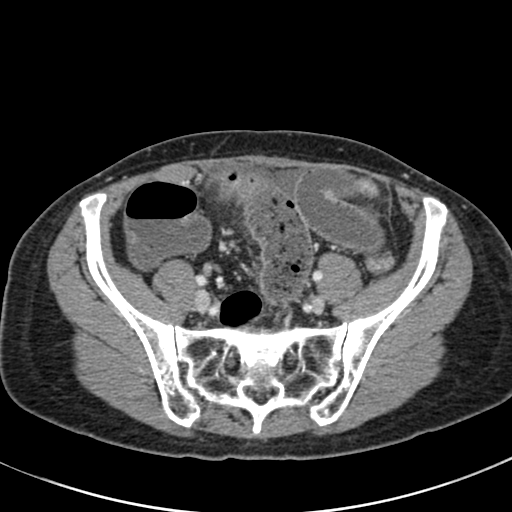
[im 62/151  soft-tissue]
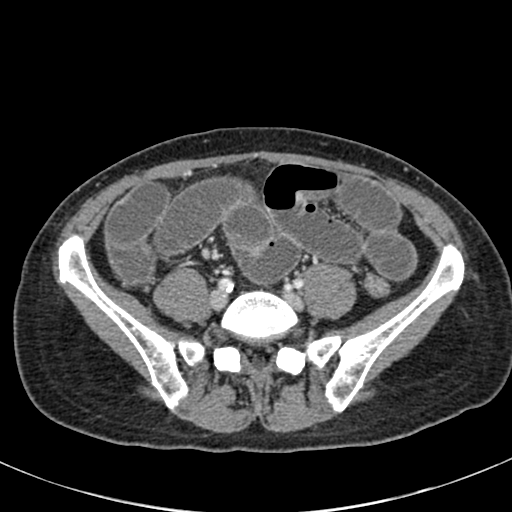
[im 80/151  soft-tissue]
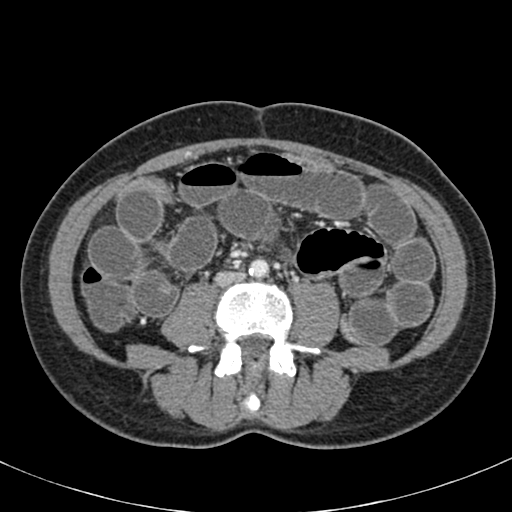
[im 89/151  soft-tissue]
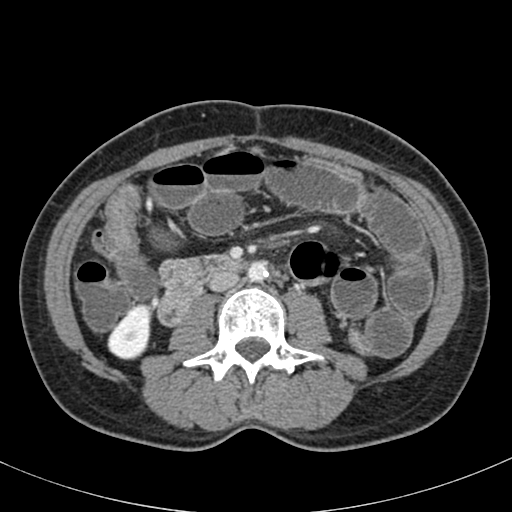
[im 98/151  soft-tissue]
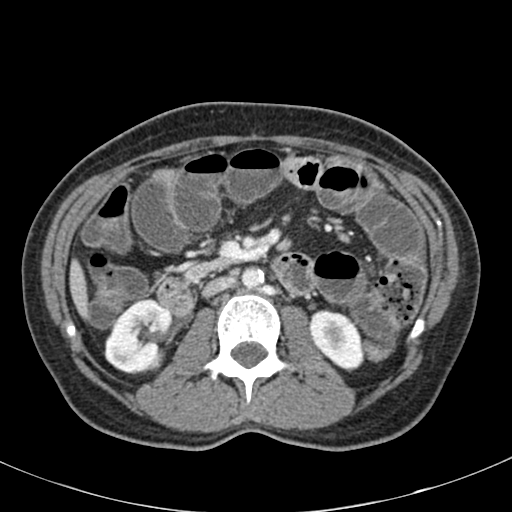
[im 98/151  bone]
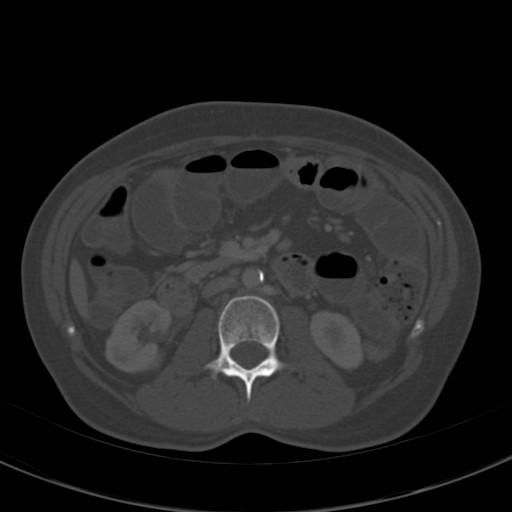
[im 106/151  soft-tissue]
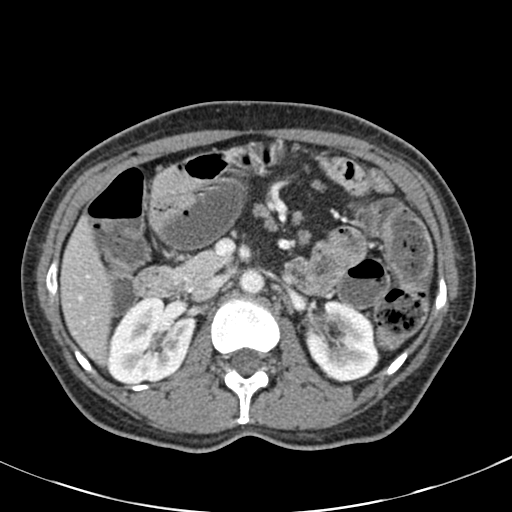
[im 115/151  soft-tissue]
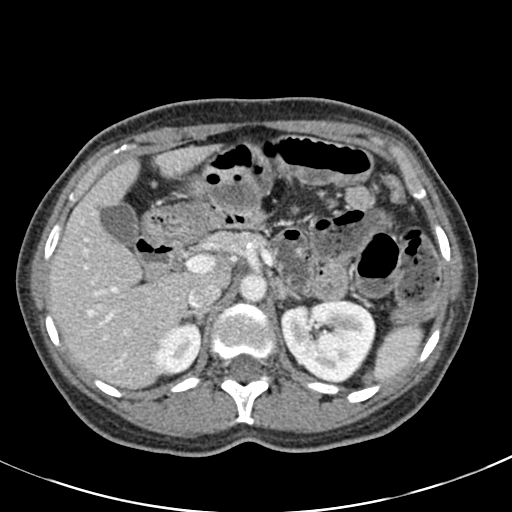
[im 115/151  lung]
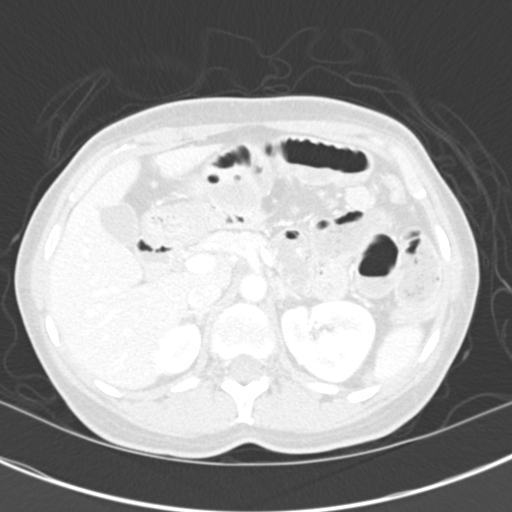
[im 124/151  lung]
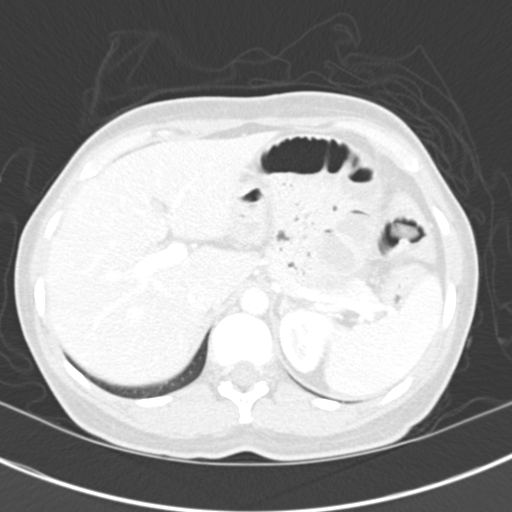
[im 133/151  soft-tissue]
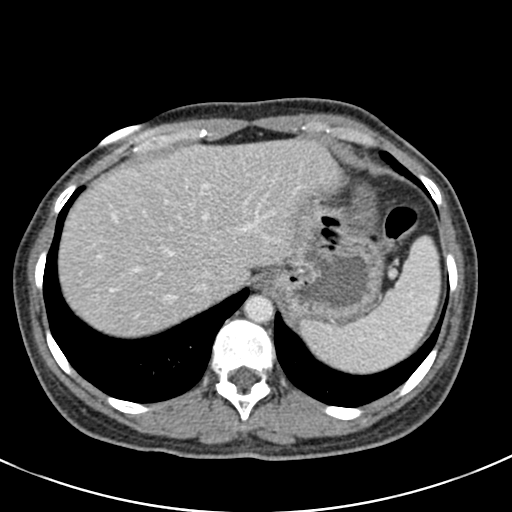
[im 133/151  lung]
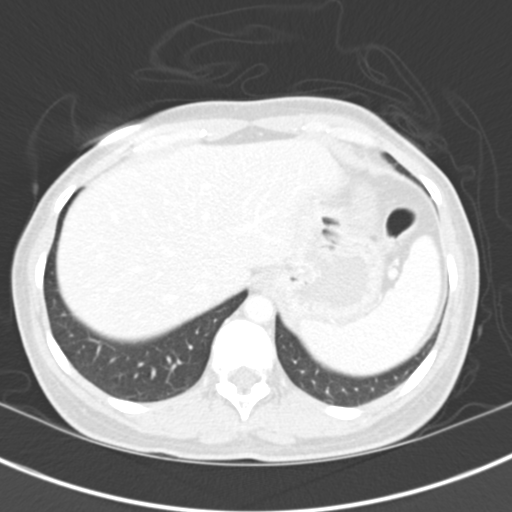
[im 142/151  soft-tissue]
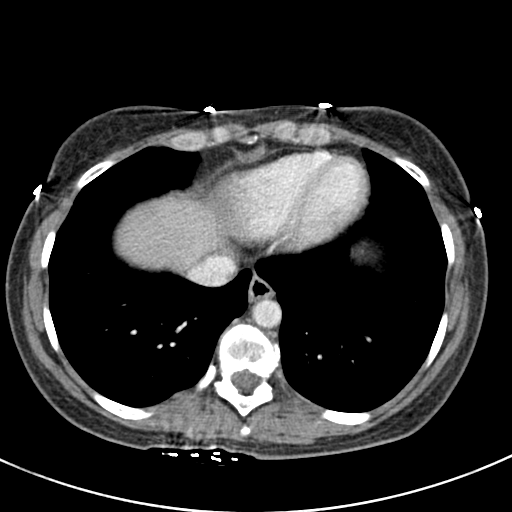
[im 142/151  lung]
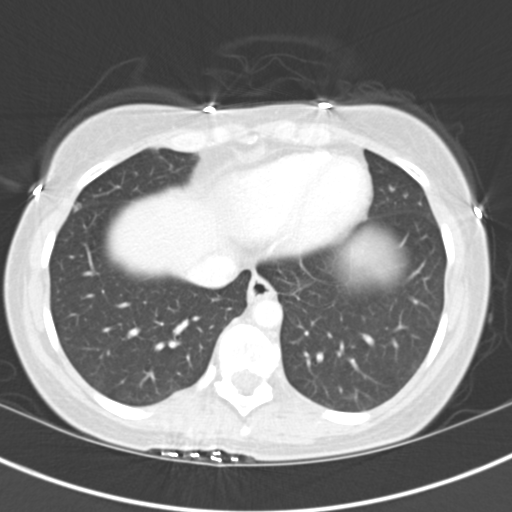

[14 of 32 positions shown; findings below may reference images not displayed]

PROCEDURE:     CT  - CT ABDOMEN / PELVIS  W  - October 27, 2012  [DATE]

RESULT:     CT of the abdomen and pelvis is performed utilizing 80 mL of
Rsovue-JWW iodinated intravenous contrast without benefit of oral contrast.
The patient has no previous exam for comparison.

There are distended fluid and air-filled loops of small bowel present
consistent with small bowel obstruction. There appears to be a transition
region in the pelvis near the midline anteriorly in the area of image 104 to
120. There is some ascites present. The colon is decompressed. Surgical
consultation is recommended. There is no pneumatosis or pneumoperitoneum.

The lung bases appear clear. The liver, spleen, pancreas and kidneys appear
to be unremarkable. There are multiple small bilateral renal cysts present.
The aorta is normal in caliber without aneurysm. There is atherosclerotic
disease present. The gallbladder is nondistended without stones being
evident. No adenopathy is seen. The bony structures appear grossly normal.
IMPRESSION: 1. Findings consistent with mechanical small bowel obstruction with a
transition point in the lower pelvis as described above. There is
equalization of bowel content of the small bowel just proximal this. Ascites
is present within the pelvis. Surgical consultation is recommended.
2. Multiple renal cysts.
3. The appendix is not definitely identified.

[REDACTED]

## 2015-08-03 MED ORDER — INSULIN ASPART 100 UNIT/ML FLEXPEN: 8.0000 [IU] | PEN_INJECTOR | Freq: Three times a day (TID) | SUBCUTANEOUS | Status: DC

## 2015-08-03 NOTE — Addendum Note (Signed)
Addended by: Caprice Beaver T on: 08/03/2015 08:49 AM   Modules accepted: Orders, Medications

## 2015-08-03 NOTE — Telephone Encounter (Signed)
RX changed to novolog.

## 2015-08-30 ENCOUNTER — Other Ambulatory Visit: Payer: Self-pay | Admitting: Endocrinology

## 2015-09-15 ENCOUNTER — Other Ambulatory Visit: Payer: Self-pay | Admitting: Endocrinology

## 2015-09-21 ENCOUNTER — Ambulatory Visit: Payer: Self-pay | Admitting: Endocrinology

## 2015-09-28 ENCOUNTER — Other Ambulatory Visit: Payer: Self-pay | Admitting: Endocrinology

## 2015-09-28 NOTE — Telephone Encounter (Signed)
Refill of amLODipine-olmesartan (AZOR) 5-20 MG tablet [facility] - [place], [place] (Phone) [phone] (Fax)

## 2015-10-11 ENCOUNTER — Ambulatory Visit (INDEPENDENT_AMBULATORY_CARE_PROVIDER_SITE_OTHER): Payer: BLUE CROSS/BLUE SHIELD | Admitting: Endocrinology

## 2015-10-11 ENCOUNTER — Encounter: Payer: Self-pay | Admitting: Endocrinology

## 2015-10-11 VITALS — BP 120/58 | HR 76 | Ht 62.5 in | Wt 151.0 lb

## 2015-10-11 DIAGNOSIS — E109 Type 1 diabetes mellitus without complications: Secondary | ICD-10-CM

## 2015-10-11 DIAGNOSIS — I10 Essential (primary) hypertension: Secondary | ICD-10-CM

## 2015-10-11 LAB — POCT GLYCOSYLATED HEMOGLOBIN (HGB A1C): HEMOGLOBIN A1C: 7.4

## 2015-10-11 NOTE — Progress Notes (Signed)
Patient ID: Diana Dixon, female   DOB: [birth year], 55 y.o.   MRN: [id number]   Reason for Appointment: Diabetes follow-up   History of Present Illness   Diagnosis: Type 1 DIABETES MELITUS, diagnosis made in 1967      She has had long-standing type 1 diabetes with persistently poor control because of variability in blood sugars and difficulty with various self-care issues She has been reluctant to do carbohydrate counting to adjust her insulin doses and has variable readings after meals especially lunch Previously has had times when she would forget her Lantus in the evening and also mealtime dose She may have done a little better with taking Lantus twice a day but does not try to adjust the doses on her own    Recent history:  Insulin regimen: Lantus 17---24, Humalog 7 units at breakfast and 8-9 units at lunch and supper 5-6 hs  She has had persistently poor control of her diabetes despite frequently advising her on insulin changes and better compliance for day-to-day management of diet, insulin regimen and glucose monitoring  A1c today is 7.4 although usually has been over 8% She was recommended [place] by this was not covered by her insurance  Current blood sugar patterns and problems identified:  She was given Contour meter by her insurance since One Touch was not covered.  However unable to get an accurate download on this recent readings not available on the printout.  She has various dates on her monitor with several blank days but her download can only give [place] information from late June.  More recently her fasting readings appear to be fairly good with only one high reading in the last week although not checking regularly.  RECENT range 72-276  She does not know why her fasting readings are better now even though previously they would be markedly variable and mostly high  She has fairly consistently high readings before suppertime except once or twice and  not clear if she is checking readings after supper.  Recent range 137-325  She continues to consume regular soft drinks and now says that she is taking a soft drink in the afternoon along with juice.  She is concerned about her difficulty with weight loss but continues to have high carbohydrate or high-fat meals at times.  Does not think she has had any hypoglycemia and a month ago had a reading of 61 in the early afternoon  He may not always cover her meals adequately especially with high carbohydrate foods  DIET: May have unbalanced meals like Pop tarts, waffles or breakfast cereal.  May have high fat meats at meals.  Still drinking some regular soft drinks and sweet tea at meals    Glucometer: Contour        Blood Glucose readings from download are difficult to interpret because of incorrect date:   Meals: 3 meals per day.  Eating eggs/meat for breakfast frequently without carbohydrate; dinner 6 pm         Physical activity: exercise:  trying to walk regularly in am or late pm 25-20 Minutes          Wt Readings from Last 3 Encounters:  10/11/15 151 lb (68.5 kg)  07/12/15 152 lb 9.6 oz (69.2 kg)  06/15/15 150 lb (68 kg)   Complications: Retinopathy  Lab Results  Component Value Date   HGBA1C 7.4 10/11/2015   HGBA1C 8.9 (A) 11/20/2014   HGBA1C 8.8 10/23/2014   Lab Results  Component  Value Date   MICROALBUR <0.7 01/17/2015   LDLCALC 86 11/20/2014   CREATININE 1.00 03/24/2014    Lab Results  Component Value Date   MICROALBUR <0.7 01/17/2015      Medication List       Accurate as of 10/11/15  8:47 PM. Always use your most recent med list.          amLODipine-olmesartan 5-20 MG tablet Commonly known as:  AZOR TAKE 1 TABLET BY MOUTH ONCE A DAY   BAYER CONTOUR NEXT MONITOR w/Device Kit Use to check blood sugar 7 times per day dx code 10.9   BAYER MICROLET LANCETS lancets Use as instructed to check blood sugar 7 times per day dx code E10.9   BD PEN NEEDLE NANO  U/F 32G X 4 MM Misc Generic drug:  Insulin Pen Needle USE TO INJECT INSULIN AS DIRECTED   ciprofloxacin 0.3 % ophthalmic solution Commonly known as:  CILOXAN Reported on 06/15/2015   glucose blood test strip Commonly known as:  BAYER CONTOUR NEXT TEST Use as instructed to check blood sugar 7 times per day dx code E10.9   insulin aspart 100 UNIT/ML FlexPen Commonly known as:  NOVOLOG FLEXPEN Inject 8 Units into the skin 3 (three) times daily with meals.   Insulin Degludec 100 UNIT/ML Sopn Commonly known as:  TRESIBA FLEXTOUCH Inject 40 Units into the skin daily.   Insulin Glargine 100 UNIT/ML Solostar Pen Commonly known as:  LANTUS SOLOSTAR Inject 16 units in the am and 24 in the evening.   pantoprazole 40 MG tablet Commonly known as:  PROTONIX TAKE 1 TABLET BY MOUTH ONCE A DAY   PARoxetine 12.5 MG 24 hr tablet Commonly known as:  PAXIL-CR Take 1 tablet (12.5 mg total) by mouth daily.   pravastatin 20 MG tablet Commonly known as:  PRAVACHOL TAKE 1 TABLET BY MOUTH ONCE DAILY   prednisoLONE acetate 1 % ophthalmic suspension Commonly known as:  PRED FORTE Reported on 06/15/2015   VIGAMOX 0.5 % ophthalmic solution Generic drug:  moxifloxacin Reported on 06/15/2015       Allergies:  Allergies  Allergen Reactions  . Penicillins   . Sulfa Antibiotics     Past Medical History:  Diagnosis Date  . Anxiety   . Depression   . Diabetes mellitus type I ([place])   . Diabetes mellitus without complication (HCC)    Type I  . Generalized anxiety disorder   . GERD (gastroesophageal reflux disease)   . Heart murmur   . Hypertension   . Major depressive disorder, single episode, moderate ([place])     Past Surgical History:  Procedure Laterality Date  . ABDOMINAL HYSTERECTOMY    . PARS PLANA VITRECTOMY Left 10/11/2014   Procedure: PARS PLANA VITRECTOMY WITH 25 GAUGE,CATARACT EXTRACTION WITH IOL INSERTION , ENDOLASER;  Surgeon: Milus Height, MD;  Location: [facility] ORS;  Service:  Ophthalmology;  Laterality: Left;  [place] AP5  CDE CASETTE LOT # [id number] h  . SHOULDER SURGERY    . TONSILLECTOMY    . TONSILLECTOMY      Family History  Problem Relation Age of Onset  . Thyroid disease Mother   . Breast cancer Mother 40  . Hypertension Mother   . Depression Mother   . Anxiety disorder Mother   . Breast cancer Maternal Aunt 103  . Breast cancer Cousin 60    Social History:  reports that she has never smoked. She has never used smokeless tobacco. She reports that she does not drink alcohol or  use drugs.  Review of Systems:  HYPERTENSION:  she has had long-standing hypertension treated with Azor.  Blood pressure well controlled   Has history of hyperlipidemia   She is taking pravastatin 20 mg without any side effects and fairly good control     Lab Results  Component Value Date   CHOL 193 08/15/2013   HDL 49 11/20/2014   LDLCALC 86 11/20/2014   LDLDIRECT 88.8 12/22/2013   TRIG 71.0 08/15/2013   CHOLHDL 3 08/15/2013     She has had mild long-standing depression controlled with Paxil and followed by psychiatrist       Examination:   BP (!) 120/58 (BP Location: Left Arm, Patient Position: Sitting, Cuff Size: Normal)   Pulse 76   Ht 5' 2.5" (1.588 m)   Wt 151 lb (68.5 kg)   SpO2 97%   BMI 27.18 kg/m   Body mass index is 27.18 kg/m.    ASSESSMENT/ PLAN:    Diabetes type 1   See history of present illness for current problems identified in blood sugar patterns and her current management. Although her A1c is 7.4 which is much better than usual and not clear if this is accurate as she still has notably high blood sugars at home However fasting readings appear to be recently better without any change in her basal insulin  Difficult to get an accurate idea of her blood sugar patterns as her monitor has random dates over the last 5 months even though she started using her monitor only a couple of months ago. She is still having high readings mostly  after lunch and before supper either from high carbohydrate intake or drinking a lot of juice and regular soft drinks. She is concerned about her difficulty with weight loss and explained to her that unless she is cutting back on carbohydrates and sugar drinks which require more insulin she will not be able to lose weight. She is still reluctant to consider an insulin pump even the Omnipod. For now will continue her insulin regimen same She will try to improve her diet, currently refusing to auscultation with dietitian Microalbumin to be checked today  She was given a new glucose monitor today and she will come back in 3 months to get a better idea of her blood sugar patterns  HYPERTENSION: Her blood pressure is fairly well-controlled     There are no Patient Instructions on file for this visit.  Counseling time on subjects discussed above is over 50% of today's 25 minute visit  Shawndra Clute 10/11/2015, 8:46 PM

## 2015-10-12 LAB — MICROALBUMIN / CREATININE URINE RATIO
Creatinine,U: 223.9 mg/dL
Microalb Creat Ratio: 0.6 mg/g (ref 0.0–30.0)
Microalb, Ur: 1.3 mg/dL (ref 0.0–1.9)

## 2015-10-12 NOTE — Addendum Note (Signed)
Addended by: Kaylyn Lim I on: 10/12/2015 08:11 AM   Modules accepted: Orders

## 2015-10-16 ENCOUNTER — Ambulatory Visit: Payer: Self-pay | Admitting: Psychiatry

## 2015-10-16 ENCOUNTER — Other Ambulatory Visit: Payer: Self-pay | Admitting: *Deleted

## 2015-10-16 ENCOUNTER — Telehealth: Payer: Self-pay | Admitting: Endocrinology

## 2015-10-16 ENCOUNTER — Other Ambulatory Visit: Payer: Self-pay | Admitting: Psychiatry

## 2015-10-16 MED ORDER — PRAVASTATIN SODIUM 20 MG PO TABS: 20.0000 mg | ORAL_TABLET | Freq: Every day | ORAL | 3 refills | Status: DC

## 2015-10-16 NOTE — Telephone Encounter (Signed)
Rx sent per patient request  

## 2015-10-16 NOTE — Telephone Encounter (Signed)
called in rx for paxil with no additional refills

## 2015-10-16 NOTE — Telephone Encounter (Signed)
PT needs refill of her Cholesterol Medication sent to [facility].

## 2015-10-22 NOTE — Telephone Encounter (Signed)
noted 

## 2015-10-23 ENCOUNTER — Encounter: Payer: Self-pay | Admitting: Psychiatry

## 2015-10-23 ENCOUNTER — Ambulatory Visit (INDEPENDENT_AMBULATORY_CARE_PROVIDER_SITE_OTHER): Payer: BLUE CROSS/BLUE SHIELD | Admitting: Psychiatry

## 2015-10-23 VITALS — BP 134/69 | HR 85 | Temp 98.4°F | Ht 62.5 in | Wt 152.6 lb

## 2015-10-23 DIAGNOSIS — F330 Major depressive disorder, recurrent, mild: Secondary | ICD-10-CM

## 2015-10-23 MED ORDER — PAROXETINE HCL ER 12.5 MG PO TB24: 12.5000 mg | ORAL_TABLET | Freq: Every day | ORAL | 2 refills | Status: DC

## 2015-10-23 NOTE — Progress Notes (Signed)
[place] MD/PA/NP OP Progress Note  10/23/2015 1:31 PM Diana Dixon  MRN:  [id number]  Subjective:  Patient is a 55 year old female who presented for follow-up. She was previously following Dr. Jimmye Norman. She reported that she has been stable on Paxil CR. She appeared well groomed during the interview. She stated that she sleeps well at night and does not have any acute issues. She was calm and cooperative during the interview. She reported that she feels that the medication has been helping her and she does not have any depressive symptoms. She denied having any mood swings anger anxiety or paranoia.  Chief Complaint:  Chief Complaint    Follow-up; Medication Refill     Visit Diagnosis:     ICD-9-CM ICD-10-CM   1. MDD (major depressive disorder), recurrent episode, mild (HCC) 296.31 F33.0     Past Medical History:  Past Medical History:  Diagnosis Date  . Anxiety   . Depression   . Diabetes mellitus type I ([place])   . Diabetes mellitus without complication (HCC)    Type I  . Generalized anxiety disorder   . GERD (gastroesophageal reflux disease)   . Heart murmur   . Hypertension   . Major depressive disorder, single episode, moderate ([place])     Past Surgical History:  Procedure Laterality Date  . ABDOMINAL HYSTERECTOMY    . PARS PLANA VITRECTOMY Left 10/11/2014   Procedure: PARS PLANA VITRECTOMY WITH 25 GAUGE,CATARACT EXTRACTION WITH IOL INSERTION , ENDOLASER;  Surgeon: Milus Height, MD;  Location: [facility] ORS;  Service: Ophthalmology;  Laterality: Left;  [place] AP5  CDE CASETTE LOT # [id number] h  . SHOULDER SURGERY    . TONSILLECTOMY    . TONSILLECTOMY     Family History:  Family History  Problem Relation Age of Onset  . Thyroid disease Mother   . Breast cancer Mother 89  . Hypertension Mother   . Depression Mother   . Anxiety disorder Mother   . Breast cancer Maternal Aunt 54  . Breast cancer Cousin 43   Social History:  Social History   Social History  . Marital status:  Widowed    Spouse name: N/A  . Number of children: N/A  . Years of education: N/A   Social History Main Topics  . Smoking status: Never Smoker  . Smokeless tobacco: Never Used  . Alcohol use No  . Drug use: No  . Sexual activity: Not Currently    Partners: Male   Other Topics Concern  . None   Social History Narrative   Regular exercise: walk   Caffeine use: sodas   Additional History:   Assessment:   Musculoskeletal: Strength & Muscle Tone: within normal limits Gait & Station: normal Patient leans: N/A  Psychiatric Specialty Exam: HPI  Review of Systems  Psychiatric/Behavioral: Negative for depression, hallucinations, memory loss, substance abuse and suicidal ideas. The patient has insomnia. The patient is not nervous/anxious.   All other systems reviewed and are negative.   Blood pressure 134/69, pulse 85, temperature 98.4 F (36.9 C), temperature source Oral, height 5' 2.5" (1.588 m), weight 152 lb 9.6 oz (69.2 kg).Body mass index is 27.47 kg/m.  General Appearance: Meticulous, Neat and Well Groomed  Eye Contact:  Good  Speech:  Normal Rate  Volume:  Normal  Mood:  Good  Affect:  Congruent smiling  Thought Process:  Linear and Logical  Orientation:  Full (Time, Place, and Person)  Thought Content:  Negative  Suicidal Thoughts:  No  Homicidal Thoughts:  No  Memory:  Immediate;   Good Recent;   Good Remote;   Good  Judgement:  Good  Insight:  Good  Psychomotor Activity:  Negative  Concentration:  Good  Recall:  [place] of Knowledge: Good  Language: Good  Akathisia:  Negative  Handed:  Right  AIMS (if indicated):  N/A  Assets:  Communication Skills Desire for Improvement Leisure Time  ADL's:  Intact  Cognition: WNL  Sleep:  Fair   Is the patient at risk to self?  No. Has the patient been a risk to self in the past 6 months?  No. Has the patient been a risk to self within the distant past?  No. Is the patient a risk to others?  No. Has the  patient been a risk to others in the past 6 months?  No. Has the patient been a risk to others within the distant past?  No.  Current Medications: Current Outpatient Prescriptions  Medication Sig Dispense Refill  . amLODipine-olmesartan (AZOR) 5-20 MG tablet TAKE 1 TABLET BY MOUTH ONCE A DAY 30 tablet 2  . BAYER MICROLET LANCETS lancets Use as instructed to check blood sugar 7 times per day dx code E10.9 200 each 3  . BD PEN NEEDLE NANO U/F 32G X 4 MM MISC USE TO INJECT INSULIN AS DIRECTED 200 each 5  . Blood Glucose Monitoring Suppl (BAYER CONTOUR NEXT MONITOR) w/Device KIT Use to check blood sugar 7 times per day dx code 10.9 1 kit 0  . ciprofloxacin (CILOXAN) 0.3 % ophthalmic solution Reported on 06/15/2015    . glucose blood (BAYER CONTOUR NEXT TEST) test strip Use as instructed to check blood sugar 7 times per day dx code E10.9 100 each 3  . insulin aspart (NOVOLOG FLEXPEN) 100 UNIT/ML FlexPen Inject 8 Units into the skin 3 (three) times daily with meals. 15 mL 2  . Insulin Degludec (TRESIBA FLEXTOUCH) 100 UNIT/ML SOPN Inject 40 Units into the skin daily. 5 pen 1  . Insulin Glargine (LANTUS SOLOSTAR) 100 UNIT/ML Solostar Pen Inject 16 units in the am and 24 in the evening. 45 mL 1  . pantoprazole (PROTONIX) 40 MG tablet TAKE 1 TABLET BY MOUTH ONCE A DAY 30 tablet 5  . PARoxetine (PAXIL-CR) 12.5 MG 24 hr tablet Take 1 tablet (12.5 mg total) by mouth daily. 30 tablet 2  . pravastatin (PRAVACHOL) 20 MG tablet Take 1 tablet (20 mg total) by mouth daily. 30 tablet 3  . prednisoLONE acetate (PRED FORTE) 1 % ophthalmic suspension Reported on 06/15/2015    . VIGAMOX 0.5 % ophthalmic solution Reported on 06/15/2015     No current facility-administered medications for this visit.     Medical Decision Making:  Review of Medication Regimen & Side Effects (2) and Review of New Medication or Change in Dosage (2)  Treatment Plan Summary:Medication management and Plan    We will continue her Paxil CR 12.5  mg daily.   Patient will continue her follow-up In 6 months.   More than 50% of the time spent in psychoeducation, counseling and coordination of care.    This note was generated in part or whole with voice recognition software. Voice regonition is usually quite accurate but there are transcription errors that can and very often do occur. I apologize for any typographical errors that were not detected and corrected.   Rainey Pines, MD  10/23/2015, 1:31 PM

## 2015-10-24 ENCOUNTER — Telehealth: Payer: Self-pay | Admitting: Nutrition

## 2015-10-24 NOTE — Telephone Encounter (Signed)
Med refilled yesterday. 

## 2015-11-03 NOTE — Telephone Encounter (Signed)
Message left explaining how the CGM works that Dr. Dwyane Dee wants her to have and asked her to call and schedule a time with me to put it on her

## 2015-12-05 ENCOUNTER — Other Ambulatory Visit: Payer: Self-pay | Admitting: Endocrinology

## 2015-12-11 ENCOUNTER — Other Ambulatory Visit: Payer: Self-pay | Admitting: Obstetrics and Gynecology

## 2015-12-11 DIAGNOSIS — Z01411 Encounter for gynecological examination (general) (routine) with abnormal findings: Secondary | ICD-10-CM | POA: Diagnosis not present

## 2015-12-11 DIAGNOSIS — Z1211 Encounter for screening for malignant neoplasm of colon: Secondary | ICD-10-CM | POA: Diagnosis not present

## 2015-12-11 DIAGNOSIS — Z1231 Encounter for screening mammogram for malignant neoplasm of breast: Secondary | ICD-10-CM | POA: Diagnosis not present

## 2015-12-11 DIAGNOSIS — R87612 Low grade squamous intraepithelial lesion on cytologic smear of cervix (LGSIL): Secondary | ICD-10-CM | POA: Diagnosis not present

## 2015-12-11 DIAGNOSIS — Z124 Encounter for screening for malignant neoplasm of cervix: Secondary | ICD-10-CM | POA: Diagnosis not present

## 2015-12-18 ENCOUNTER — Other Ambulatory Visit: Payer: Self-pay | Admitting: Internal Medicine

## 2015-12-19 ENCOUNTER — Telehealth: Payer: Self-pay | Admitting: Endocrinology

## 2015-12-19 NOTE — Telephone Encounter (Signed)
Patient stated that her insulin Lantus & Novalog is to expensive, do we have any copay cards to help her. Please advise

## 2015-12-20 NOTE — Telephone Encounter (Signed)
Left voicemail for patient to call back. 

## 2015-12-20 NOTE — Telephone Encounter (Signed)
Patient called [facility] asking to get a card for medication.. Please advise

## 2015-12-20 NOTE — Telephone Encounter (Signed)
See message and please advise, Thanks!  

## 2015-12-20 NOTE — Telephone Encounter (Signed)
Patient has called back on there status of her medication, it is to expensive, they are charging her 200, and she is almost out of her lantus.  Please advise

## 2015-12-20 NOTE — Telephone Encounter (Signed)
We can send a co-pay card for Lantus, not sure if she can print 1 on line Otherwise she needs to find out from insurance which is less expensive

## 2016-01-09 ENCOUNTER — Other Ambulatory Visit: Payer: Self-pay | Admitting: Endocrinology

## 2016-01-10 ENCOUNTER — Ambulatory Visit
Admission: RE | Admit: 2016-01-10 | Discharge: 2016-01-10 | Disposition: A | Discharge status: Home / Self Care | Payer: BLUE CROSS/BLUE SHIELD | Source: Ambulatory Visit | Attending: Obstetrics and Gynecology | Admitting: Obstetrics and Gynecology

## 2016-01-10 DIAGNOSIS — Z1231 Encounter for screening mammogram for malignant neoplasm of breast: Secondary | ICD-10-CM | POA: Insufficient documentation

## 2016-01-10 DIAGNOSIS — N890 Mild vaginal dysplasia: Secondary | ICD-10-CM | POA: Diagnosis not present

## 2016-01-15 ENCOUNTER — Ambulatory Visit: Payer: BLUE CROSS/BLUE SHIELD

## 2016-03-17 ENCOUNTER — Telehealth: Payer: Self-pay | Admitting: Endocrinology

## 2016-03-17 MED ORDER — INSULIN ASPART 100 UNIT/ML FLEXPEN: PEN_INJECTOR | SUBCUTANEOUS | 1 refills | Status: DC

## 2016-03-17 NOTE — Telephone Encounter (Signed)
Refill submitted. 

## 2016-03-17 NOTE — Telephone Encounter (Signed)
Pt called in needing her Novolog sent to [facility] ASAP, she is there and needs it done.

## 2016-03-28 ENCOUNTER — Other Ambulatory Visit: Payer: Self-pay | Admitting: Endocrinology

## 2016-03-28 NOTE — Telephone Encounter (Signed)
Ordered 03/28/16 

## 2016-03-28 NOTE — Telephone Encounter (Signed)
Refill of pravastatin (PRAVACHOL) 20 MG tablet  [facility] - [place], [place] [address] (Phone) [phone] (Fax)

## 2016-04-22 ENCOUNTER — Other Ambulatory Visit: Payer: Self-pay | Admitting: Endocrinology

## 2016-04-22 NOTE — Telephone Encounter (Signed)
Pt called in and said that she is going by the pharmacy today and needs the Azor refilled as soon as possible. She would like a call back when completed.

## 2016-05-02 ENCOUNTER — Telehealth: Payer: Self-pay | Admitting: Endocrinology

## 2016-05-02 MED ORDER — INSULIN ASPART 100 UNIT/ML FLEXPEN: PEN_INJECTOR | SUBCUTANEOUS | 1 refills | Status: DC

## 2016-05-02 NOTE — Telephone Encounter (Signed)
I contacted the pharmacy and they stated they needed a new rx with the patient's updated dosage. Current rx stated to take 8 units tid. Last office note form August of 2018 stated the patient could take 9 units with her lunch meal. Rx submitted to state 8 units at breakfast, 9 units with lunch and 8 units with supper.

## 2016-05-02 NOTE — Telephone Encounter (Signed)
The pharmacy called in and needs to speak with you as soon as possible about the insulin refill. The patient needs this ASAP. CB# [phone]

## 2016-05-02 NOTE — Telephone Encounter (Signed)
Pt called wanting to know if her pharmacy called about her insulin, she is almost out and needs refills, please call pt as soon as possible

## 2016-05-06 ENCOUNTER — Other Ambulatory Visit: Payer: Self-pay

## 2016-05-06 DIAGNOSIS — H66001 Acute suppurative otitis media without spontaneous rupture of ear drum, right ear: Secondary | ICD-10-CM | POA: Diagnosis not present

## 2016-05-06 MED ORDER — INSULIN ASPART 100 UNIT/ML FLEXPEN: PEN_INJECTOR | SUBCUTANEOUS | 3 refills | Status: DC

## 2016-05-19 ENCOUNTER — Other Ambulatory Visit: Payer: Self-pay | Admitting: Endocrinology

## 2016-05-23 ENCOUNTER — Ambulatory Visit (INDEPENDENT_AMBULATORY_CARE_PROVIDER_SITE_OTHER): Payer: BLUE CROSS/BLUE SHIELD | Admitting: Endocrinology

## 2016-05-23 ENCOUNTER — Encounter: Payer: Self-pay | Admitting: Endocrinology

## 2016-05-23 ENCOUNTER — Other Ambulatory Visit: Payer: Self-pay

## 2016-05-23 VITALS — BP 124/46 | HR 80 | Ht 63.0 in | Wt 152.0 lb

## 2016-05-23 DIAGNOSIS — E1065 Type 1 diabetes mellitus with hyperglycemia: Secondary | ICD-10-CM | POA: Diagnosis not present

## 2016-05-23 LAB — COMPREHENSIVE METABOLIC PANEL
ALT: 27 U/L (ref 0–35)
AST: 24 U/L (ref 0–37)
Albumin: 4.3 g/dL (ref 3.5–5.2)
Alkaline Phosphatase: 95 U/L (ref 39–117)
BUN: 15 mg/dL (ref 6–23)
CALCIUM: 9.4 mg/dL (ref 8.4–10.5)
CHLORIDE: 105 meq/L (ref 96–112)
CO2: 29 meq/L (ref 19–32)
Creatinine, Ser: 0.91 mg/dL (ref 0.40–1.20)
GFR: 68.11 mL/min (ref >60.00)
Glucose, Bld: 273 mg/dL — ABNORMAL HIGH (ref 70–99)
Potassium: 4 mEq/L (ref 3.5–5.1)
Sodium: 139 mEq/L (ref 135–145)
Total Bilirubin: 0.4 mg/dL (ref 0.2–1.2)
Total Protein: 6.9 g/dL (ref 6.0–8.3)

## 2016-05-23 LAB — POCT GLYCOSYLATED HEMOGLOBIN (HGB A1C): HEMOGLOBIN A1C: 8.2

## 2016-05-23 LAB — LIPID PANEL
CHOL/HDL RATIO: 3
Cholesterol: 168 mg/dL (ref 0–200)
HDL: 48.8 mg/dL (ref >39.00)
LDL Cholesterol: 104 mg/dL — ABNORMAL HIGH (ref 0–99)
NonHDL: 118.99
TRIGLYCERIDES: 74 mg/dL (ref 0.0–149.0)
VLDL: 14.8 mg/dL (ref 0.0–40.0)

## 2016-05-23 MED ORDER — FREESTYLE LIBRE READER DEVI: 1.0000 | 1 refills | Status: DC

## 2016-05-23 MED ORDER — FREESTYLE LIBRE SENSOR SYSTEM MISC: 4 refills | Status: DC

## 2016-05-23 MED ORDER — INSULIN GLARGINE 100 UNIT/ML SOLOSTAR PEN: PEN_INJECTOR | SUBCUTANEOUS | 2 refills | Status: DC

## 2016-05-23 MED ORDER — INSULIN DEGLUDEC 100 UNIT/ML ~~LOC~~ SOPN: 40.0000 [IU] | PEN_INJECTOR | Freq: Every day | SUBCUTANEOUS | 1 refills | Status: DC

## 2016-05-23 MED ORDER — INSULIN ASPART 100 UNIT/ML FLEXPEN: PEN_INJECTOR | SUBCUTANEOUS | 3 refills | Status: DC

## 2016-05-23 NOTE — Patient Instructions (Addendum)
Lantus 29 units in pm and no Novolog at bedtime  Humalog ?? 12 units at supper if 7 pm sugar over 180

## 2016-05-23 NOTE — Progress Notes (Signed)
Patient ID: Diana Dixon, female   DOB: March 21, 1960, 56 y.o.   MRN: [id number]   Reason for Appointment: Diabetes follow-up   History of Present Illness   Diagnosis: Type 1 DIABETES MELITUS, diagnosis made in 1967      She has had long-standing type 1 diabetes with persistently poor control because of variability in blood sugars and difficulty with various self-care issues She has been reluctant to do carbohydrate counting to adjust her insulin doses and has variable readings after meals especially lunch Previously has had times when she would forget her Lantus in the evening and also mealtime dose She may have done a little better with taking Lantus twice a day but does not try to adjust the doses on her own    Recent history:  Insulin regimen: Lantus 17---24, Novolog 7 units at breakfast and 8-9 units at lunch and supper 5-6 hs  She has had persistently poor control of her diabetes despite frequently advising her on insulin changes and better compliance for day-to-day management of diet, insulin regimen and glucose monitoring  A1c today is 8.2, previously 7.4 although she has not been seen in follow-up since 09/2015  Current blood sugar patterns and problems identified:  FASTING readings are again fluctuating very significantly with overall high  Appears that she periodically take NovoLog at bedtime when the blood sugars are high and then her sugars are better in the morning  She has only a few readings before and after supper and her late night readings are all over 200  She said that most of her high readings later in the evening may be higher because of eating snacks after supper without any insulin coverage  She has also sporadically high readings before and after lunch based on her diet and use of regular soft drinks or juice  Does not adjust her NOVOLOG based on her carbohydrate intake consistently and may also have high-fat foods  She thinks she is taking  her NovoLog BEFORE meals more consistently  She has sporadic low blood sugars after breakfast or lunch  DIET: May have unbalanced meals like Pop tarts, waffles or breakfast cereal.  May have high fat meats at meals.  Still drinking some regular soft drinks and sweet tea at meals    Glucometer: Contour        Blood Glucose readings from download: She has 47 readings for the last 4 weeks   Mean values apply above for all meters except median for One Touch  PRE-MEAL Fasting Lunch Dinner Bedtime Overall  Glucose range: 72-362 59-3 33  63-272  93-302   Mean/median: 200 211  160  247 204     Meals: 3 meals per day.  Eating eggs/meat for breakfast frequently without carbohydrate; dinner 5-6 pm         Physical activity: exercise:  trying to walk Sometimes in  pm, usually for 25-20 Minutes          Wt Readings from Last 3 Encounters:  05/23/16 152 lb (68.9 kg)  10/11/15 151 lb (68.5 kg)  06/15/15 150 lb (68 kg)   Complications: Retinopathy  Lab Results  Component Value Date   HGBA1C 8.2 05/23/2016   HGBA1C 7.4 10/11/2015   HGBA1C 8.9 (A) 11/20/2014   Lab Results  Component Value Date   MICROALBUR 1.3 10/12/2015   LDLCALC 104 (H) 05/23/2016   CREATININE 0.91 05/23/2016    Lab Results  Component Value Date   MICROALBUR 1.3 10/12/2015  Allergies as of 05/23/2016      Reactions   Penicillins    Sulfa Antibiotics       Medication List       Accurate as of 05/23/16 11:59 PM. Always use your most recent med list.          amLODipine-olmesartan 5-20 MG tablet Commonly known as:  AZOR TAKE 1 TABLET BY MOUTH ONCE DAILY   BAYER CONTOUR NEXT MONITOR w/Device Kit Use to check blood sugar 7 times per day dx code 10.9   BAYER CONTOUR NEXT TEST test strip Generic drug:  glucose blood USE AS INSTRUCTED TO CHECK BLOOD SUGAR 7TIMES A DAY   BAYER MICROLET LANCETS lancets Use as instructed to check blood sugar 7 times per day dx code E10.9   BD PEN NEEDLE NANO U/F 32G X 4  MM Misc Generic drug:  Insulin Pen Needle USE TO INJECT INSULIN AS DIRECTED 4 TIMES A DAY   ciprofloxacin 0.3 % ophthalmic solution Commonly known as:  CILOXAN Reported on 06/15/2015   FREESTYLE LIBRE READER Devi 1 Device by Does not apply route as directed. [id number]   FREESTYLE LIBRE SENSOR SYSTEM Misc Apply 1 sensor every 10 days   insulin aspart 100 UNIT/ML FlexPen Commonly known as:  NOVOLOG FLEXPEN Inject 8 units with breakfast 9 with lunch and 8 with supper   insulin degludec 100 UNIT/ML Sopn FlexTouch Pen Commonly known as:  TRESIBA FLEXTOUCH Inject 0.4 mLs (40 Units total) into the skin daily.   Insulin Glargine 100 UNIT/ML Solostar Pen Commonly known as:  LANTUS SOLOSTAR INJECT 16 UNITS IN THE MORNING AND 24 UNITS IN THE EVENING   pantoprazole 40 MG tablet Commonly known as:  PROTONIX TAKE 1 TABLET BY MOUTH ONCE A DAY   PARoxetine 12.5 MG 24 hr tablet Commonly known as:  PAXIL-CR Take 1 tablet (12.5 mg total) by mouth daily.   pravastatin 20 MG tablet Commonly known as:  PRAVACHOL TAKE 1 TABLET BY MOUTH ONCE DAILY   prednisoLONE acetate 1 % ophthalmic suspension Commonly known as:  PRED FORTE Reported on 06/15/2015   VIGAMOX 0.5 % ophthalmic solution Generic drug:  moxifloxacin Reported on 06/15/2015       Allergies:  Allergies  Allergen Reactions  . Penicillins   . Sulfa Antibiotics     Past Medical History:  Diagnosis Date  . Anxiety   . Depression   . Diabetes mellitus type I ([place])   . Diabetes mellitus without complication (HCC)    Type I  . Generalized anxiety disorder   . GERD (gastroesophageal reflux disease)   . Heart murmur   . Hypertension   . Major depressive disorder, single episode, moderate ([place])     Past Surgical History:  Procedure Laterality Date  . ABDOMINAL HYSTERECTOMY    . PARS PLANA VITRECTOMY Left 10/11/2014   Procedure: PARS PLANA VITRECTOMY WITH 25 GAUGE,CATARACT EXTRACTION WITH IOL INSERTION , ENDOLASER;   Surgeon: Milus Height, MD;  Location: [facility] ORS;  Service: Ophthalmology;  Laterality: Left;  [place] AP5  CDE CASETTE LOT # [id number] h  . SHOULDER SURGERY    . TONSILLECTOMY    . TONSILLECTOMY      Family History  Problem Relation Age of Onset  . Thyroid disease Mother   . Breast cancer Mother 20  . Hypertension Mother   . Depression Mother   . Anxiety disorder Mother   . Breast cancer Maternal Aunt 61  . Breast cancer Cousin 71    Social History:  reports  that she has never smoked. She has never used smokeless tobacco. She reports that she does not drink alcohol or use drugs.  Review of Systems:  HYPERTENSION:  she has had long-standing hypertension treated with Azor.  Blood pressure well controlled   Has history of hyperlipidemia   She is taking pravastatin 20 mg without any side effects and Previously good control     Lab Results  Component Value Date   CHOL 168 05/23/2016   HDL 48.80 05/23/2016   LDLCALC 104 (H) 05/23/2016   LDLDIRECT 88.8 12/22/2013   TRIG 74.0 05/23/2016   CHOLHDL 3 05/23/2016     She has had mild long-standing depression controlled with Paxil and followed by psychiatrist      Examination:   BP (!) 124/46   Pulse 80   Ht 5' 3"  (1.6 m)   Wt 152 lb (68.9 kg)   BMI 26.93 kg/m   Body mass index is 26.93 kg/m.    ASSESSMENT/ PLAN:    Diabetes type 1   See history of present illness for current problems identified, recent blood sugar patterns and her day-to-day management.  She has mostly high blood sugars throughout the day except when taking extra insulin to cover her hyperglycemia later night or occasionally during the day Checking blood sugars mostly in the mornings and difficult to know what her blood sugars are based on various types of meals or carbohydrate intake Still difficult to cover her meals as they are sometimes unbalanced more carbohydrates or simple sugars Also snacking in the evenings causing her sugars to be higher later  night  Again A1c is over 8%  RECOMMENDATIONS:  Discussed need to take at least 12 units of Humalog at suppertime and some extra doses when eating later in the evening with snacks  Increase Lantus to 29 units in the evening and then she does not need to take extra insulin at bedtime to avoid overnight hypoglycemia  More consistent glucose monitoring at various times  She would try to see if she has coverage for the freestyle Elenor Legato that can be affordable for her.  Discussed in detail how this is used and what information she can get as well as methodology with this  Recommend consultation with dietitian for better meal planning and balanced meals but she is not willing to do this because of cost  She will also need to discuss her anxiety and depression with psychiatrist for better management  HYPERTENSION: Her blood pressure is  well-controlled   Patient Instructions  Lantus 29 units in pm and no Novolog at bedtime  Humalog ?? 12 units at supper if 7 pm sugar over 180   Counseling time on subjects discussed above is over 50% of today's 25 minute visit  Jniyah Dantuono 05/25/2016, 6:15 PM

## 2016-05-25 NOTE — Progress Notes (Signed)
Please let patient know that the cholesterol is high, need to increase her pravastatin to 40 mg instead of 20 and we will recheck on her next visit in 3 months Also her blood sugar was 273, need to cut back on carbohydrates at breakfast

## 2016-05-30 ENCOUNTER — Other Ambulatory Visit: Payer: Self-pay

## 2016-05-30 MED ORDER — PRAVASTATIN SODIUM 40 MG PO TABS: 40.0000 mg | ORAL_TABLET | Freq: Every day | ORAL | 3 refills | Status: DC

## 2016-06-18 ENCOUNTER — Telehealth: Payer: Self-pay

## 2016-06-18 NOTE — Telephone Encounter (Signed)
pt called left message that she wanted to make an appt to be seen.

## 2016-06-18 NOTE — Telephone Encounter (Signed)
tried to call patient to set up appt but no answer no message could be left.  pt last seen 10-2015

## 2016-06-19 ENCOUNTER — Other Ambulatory Visit: Payer: Self-pay | Admitting: Endocrinology

## 2016-06-23 NOTE — Telephone Encounter (Signed)
pt left message on voicemail that she needs a refill on her medications paxil,

## 2016-06-23 NOTE — Telephone Encounter (Signed)
pt left message with answering services that is needs refill on paxil pt has appt 07-09-16 last seen on 10-23-15

## 2016-06-25 ENCOUNTER — Other Ambulatory Visit: Payer: Self-pay

## 2016-06-25 MED ORDER — AMLODIPINE-OLMESARTAN 5-20 MG PO TABS: 1.0000 | ORAL_TABLET | Freq: Every day | ORAL | 2 refills | Status: DC

## 2016-07-09 ENCOUNTER — Encounter: Payer: Self-pay | Admitting: Psychiatry

## 2016-07-09 ENCOUNTER — Ambulatory Visit (INDEPENDENT_AMBULATORY_CARE_PROVIDER_SITE_OTHER): Payer: Self-pay | Admitting: Psychiatry

## 2016-07-09 VITALS — BP 132/67 | HR 83 | Temp 98.3°F | Wt 153.4 lb

## 2016-07-09 DIAGNOSIS — F330 Major depressive disorder, recurrent, mild: Secondary | ICD-10-CM

## 2016-07-09 MED ORDER — PAROXETINE HCL 10 MG PO TABS: 10.0000 mg | ORAL_TABLET | Freq: Every day | ORAL | 1 refills | Status: DC

## 2016-07-09 NOTE — Progress Notes (Signed)
BH MD/PA/NP OP Progress Note  07/09/2016 9:09 AM Diana Dixon  MRN:  [id number]  Subjective:  Patient is a 56 year old female who presented for follow-up after 6 months.  She reported that she has been stable on Paxil CR but the medication cost is too high and now and she has to  pay $80. She reported that she wants to change the medications. She reported that she has been trying to manage her diabetes. Her blood sugar is around 180. She reported that she is on insulin. She is also exercising and his walking her dog. She does walk on a daily basis. She  stated that she does not use any drugs or alcohol. She reported that she sleeps well at night. She wants to take  Paxil regular dose now. . She appears calm and alert during the interview. She denied having any perceptual disturbances. She denied having any suicidal homicidal ideations or plans.   Chief Complaint:  Chief Complaint    Follow-up; Medication Refill     Visit Diagnosis:     ICD-9-CM ICD-10-CM   1. MDD (major depressive disorder), recurrent episode, mild (HCC) 296.31 F33.0     Past Medical History:  Past Medical History:  Diagnosis Date  . Anxiety   . Depression   . Diabetes mellitus type I ([place])   . Diabetes mellitus without complication (HCC)    Type I  . Generalized anxiety disorder   . GERD (gastroesophageal reflux disease)   . Heart murmur   . Hypertension   . Major depressive disorder, single episode, moderate ([place])     Past Surgical History:  Procedure Laterality Date  . ABDOMINAL HYSTERECTOMY    . PARS PLANA VITRECTOMY Left 10/11/2014   Procedure: PARS PLANA VITRECTOMY WITH 25 GAUGE,CATARACT EXTRACTION WITH IOL INSERTION , ENDOLASER;  Surgeon: Milus Height, MD;  Location: [facility] ORS;  Service: Ophthalmology;  Laterality: Left;  [place] AP5  CDE CASETTE LOT # [id number] h  . SHOULDER SURGERY    . TONSILLECTOMY    . TONSILLECTOMY     Family History:  Family History  Problem Relation Age of Onset  . Thyroid  disease Mother   . Breast cancer Mother 18  . Hypertension Mother   . Depression Mother   . Anxiety disorder Mother   . Breast cancer Maternal Aunt 58  . Breast cancer Cousin 61   Social History:  Social History   Social History  . Marital status: Single    Spouse name: N/A  . Number of children: N/A  . Years of education: N/A   Social History Main Topics  . Smoking status: Never Smoker  . Smokeless tobacco: Never Used  . Alcohol use No  . Drug use: No  . Sexual activity: Not Currently    Partners: Male   Other Topics Concern  . None   Social History Narrative   Regular exercise: walk   Caffeine use: sodas   Additional History:   Assessment:   Musculoskeletal: Strength & Muscle Tone: within normal limits Gait & Station: normal Patient leans: N/A  Psychiatric Specialty Exam: Medication Refill     Review of Systems  Psychiatric/Behavioral: Negative for depression, hallucinations, memory loss, substance abuse and suicidal ideas. The patient has insomnia. The patient is not nervous/anxious.   All other systems reviewed and are negative.   Blood pressure 132/67, pulse 83, temperature 98.3 F (36.8 C), temperature source Oral, weight 153 lb 6.4 oz (69.6 kg).Body mass index is 27.17 kg/m.  General Appearance: Meticulous,  Neat and Well Groomed  Eye Contact:  Good  Speech:  Normal Rate  Volume:  Normal  Mood:  Good  Affect:  Congruent smiling  Thought Process:  Linear and Logical  Orientation:  Full (Time, Place, and Person)  Thought Content:  Negative  Suicidal Thoughts:  No  Homicidal Thoughts:  No  Memory:  Immediate;   Good Recent;   Good Remote;   Good  Judgement:  Good  Insight:  Good  Psychomotor Activity:  Negative  Concentration:  Good  Recall:  Fair  Fund of Knowledge: Good  Language: Good  Akathisia:  Negative  Handed:  Right  AIMS (if indicated):  N/A  Assets:  Communication Skills Desire for Improvement Leisure Time  ADL's:  Intact   Cognition: WNL  Sleep:  Fair   Is the patient at risk to self?  No. Has the patient been a risk to self in the past 6 months?  No. Has the patient been a risk to self within the distant past?  No. Is the patient a risk to others?  No. Has the patient been a risk to others in the past 6 months?  No. Has the patient been a risk to others within the distant past?  No.  Current Medications: Current Outpatient Prescriptions  Medication Sig Dispense Refill  . amLODipine-olmesartan (AZOR) 5-20 MG tablet Take 1 tablet by mouth daily. 30 tablet 2  . BAYER CONTOUR NEXT TEST test strip USE AS INSTRUCTED TO CHECK BLOOD SUGAR 7TIMES A DAY 100 each 5  . BAYER MICROLET LANCETS lancets Use as instructed to check blood sugar 7 times per day dx code E10.9 200 each 3  . BD PEN NEEDLE NANO U/F 32G X 4 MM MISC USE TO INJECT INSULIN AS DIRECTED 4 TIMES A DAY 200 each 1  . Blood Glucose Monitoring Suppl (BAYER CONTOUR NEXT MONITOR) w/Device KIT Use to check blood sugar 7 times per day dx code 10.9 1 kit 0  . ciprofloxacin (CILOXAN) 0.3 % ophthalmic solution Reported on 06/15/2015    . Continuous Blood Gluc Receiver (FREESTYLE LIBRE READER) DEVI 1 Device by Does not apply route as directed. [id number] 1 Device 1  . Continuous Blood Gluc Sensor (FREESTYLE LIBRE SENSOR SYSTEM) MISC Apply 1 sensor every 10 days 3 each 4  . insulin aspart (NOVOLOG FLEXPEN) 100 UNIT/ML FlexPen Inject 8 units with breakfast 9 with lunch and 8 with supper 5 pen 3  . insulin degludec (TRESIBA FLEXTOUCH) 100 UNIT/ML SOPN FlexTouch Pen Inject 0.4 mLs (40 Units total) into the skin daily. 5 pen 1  . Insulin Glargine (LANTUS SOLOSTAR) 100 UNIT/ML Solostar Pen INJECT 16 UNITS IN THE MORNING AND 24 UNITS IN THE EVENING 45 mL 2  . pantoprazole (PROTONIX) 40 MG tablet TAKE 1 TABLET BY MOUTH ONCE A DAY 30 tablet 2  . PARoxetine (PAXIL-CR) 12.5 MG 24 hr tablet Take 1 tablet (12.5 mg total) by mouth daily. 90 tablet 2  . pravastatin  (PRAVACHOL) 40 MG tablet Take 1 tablet (40 mg total) by mouth daily. 30 tablet 3  . prednisoLONE acetate (PRED FORTE) 1 % ophthalmic suspension Reported on 06/15/2015    . VIGAMOX 0.5 % ophthalmic solution Reported on 06/15/2015     No current facility-administered medications for this visit.     Medical Decision Making:  Review of Medication Regimen & Side Effects (2) and Review of New Medication or Change in Dosage (2)  Treatment Plan Summary:Medication management and Plan   I will start   her on Paxil 10 mg every daily. Discussed her about the medication and she agreed with the plan.   Patient will continue her follow-up In 6 months.   More than 50% of the time spent in psychoeducation, counseling and coordination of care.    This note was generated in part or whole with voice recognition software. Voice regonition is usually quite accurate but there are transcription errors that can and very often do occur. I apologize for any typographical errors that were not detected and corrected.   Uzma Faheem, MD  07/09/2016, 9:09 AM  

## 2016-07-18 NOTE — Telephone Encounter (Signed)
rx done PARoxetine (PAXIL) 10 MG tablet 90 tablet 1 07/09/2016   Sig - Route: Take 1 tablet (10 mg total) by mouth daily. - Oral  E-Prescribing Status: Receipt confirmed by pharmacy (07/09/2016 9:12 AM EDT)

## 2016-08-12 DIAGNOSIS — E103593 Type 1 diabetes mellitus with proliferative diabetic retinopathy without macular edema, bilateral: Secondary | ICD-10-CM | POA: Diagnosis not present

## 2016-09-01 ENCOUNTER — Other Ambulatory Visit: Payer: Self-pay | Admitting: Endocrinology

## 2016-09-19 ENCOUNTER — Other Ambulatory Visit: Payer: Self-pay | Admitting: Endocrinology

## 2016-09-24 DIAGNOSIS — L578 Other skin changes due to chronic exposure to nonionizing radiation: Secondary | ICD-10-CM | POA: Diagnosis not present

## 2016-09-24 DIAGNOSIS — L820 Inflamed seborrheic keratosis: Secondary | ICD-10-CM | POA: Diagnosis not present

## 2016-09-24 DIAGNOSIS — D1801 Hemangioma of skin and subcutaneous tissue: Secondary | ICD-10-CM | POA: Diagnosis not present

## 2016-09-24 DIAGNOSIS — L821 Other seborrheic keratosis: Secondary | ICD-10-CM | POA: Diagnosis not present

## 2016-09-24 DIAGNOSIS — L570 Actinic keratosis: Secondary | ICD-10-CM | POA: Diagnosis not present

## 2016-09-24 DIAGNOSIS — D225 Melanocytic nevi of trunk: Secondary | ICD-10-CM | POA: Diagnosis not present

## 2016-09-27 DIAGNOSIS — S99922A Unspecified injury of left foot, initial encounter: Secondary | ICD-10-CM | POA: Diagnosis not present

## 2016-10-01 ENCOUNTER — Other Ambulatory Visit: Payer: Self-pay

## 2016-10-01 ENCOUNTER — Telehealth: Payer: Self-pay | Admitting: Endocrinology

## 2016-10-01 MED ORDER — INSULIN ASPART 100 UNIT/ML FLEXPEN: PEN_INJECTOR | SUBCUTANEOUS | 0 refills | Status: DC

## 2016-10-01 NOTE — Telephone Encounter (Signed)
Patient was told by pharmacy she could not refill Novalog with out an office visit, patient is stating she has been already. (05/23/16) Asking for return call to explain situation.

## 2016-10-01 NOTE — Telephone Encounter (Signed)
Called patient and let her know that I will resend in her insulin prescription. I advised to her that she is due for an appointment and she stated that she currently is not working and her bill was over $200 for her last visit and she is not able to afford a visit at this time. This is about when her phone disconnected.

## 2016-10-29 ENCOUNTER — Ambulatory Visit (INDEPENDENT_AMBULATORY_CARE_PROVIDER_SITE_OTHER): Payer: BLUE CROSS/BLUE SHIELD | Admitting: Endocrinology

## 2016-10-29 ENCOUNTER — Other Ambulatory Visit: Payer: Self-pay

## 2016-10-29 VITALS — BP 108/60 | HR 88 | Ht 63.0 in | Wt 153.0 lb

## 2016-10-29 DIAGNOSIS — I10 Essential (primary) hypertension: Secondary | ICD-10-CM

## 2016-10-29 DIAGNOSIS — E1065 Type 1 diabetes mellitus with hyperglycemia: Secondary | ICD-10-CM | POA: Diagnosis not present

## 2016-10-29 LAB — MICROALBUMIN / CREATININE URINE RATIO
CREATININE, U: 283.1 mg/dL
MICROALB UR: 1.7 mg/dL (ref 0.0–1.9)
Microalb Creat Ratio: 0.6 mg/g (ref 0.0–30.0)

## 2016-10-29 LAB — POCT GLYCOSYLATED HEMOGLOBIN (HGB A1C): Hemoglobin A1C: 8.6

## 2016-10-29 MED ORDER — INSULIN ASPART 100 UNIT/ML FLEXPEN: PEN_INJECTOR | SUBCUTANEOUS | 2 refills | Status: DC

## 2016-10-29 MED ORDER — INSULIN GLARGINE 100 UNIT/ML SOLOSTAR PEN: PEN_INJECTOR | SUBCUTANEOUS | 2 refills | Status: DC

## 2016-10-29 MED ORDER — AMLODIPINE-OLMESARTAN 5-20 MG PO TABS: 1.0000 | ORAL_TABLET | Freq: Every day | ORAL | 2 refills | Status: DC

## 2016-10-29 MED ORDER — PHENTERMINE HCL 15 MG PO CAPS: 15.0000 mg | ORAL_CAPSULE | ORAL | 2 refills | Status: DC

## 2016-10-29 MED ORDER — INSULIN PEN NEEDLE 32G X 4 MM MISC: 2 refills | Status: DC

## 2016-10-29 MED ORDER — PRAVASTATIN SODIUM 40 MG PO TABS: 40.0000 mg | ORAL_TABLET | Freq: Every day | ORAL | 3 refills | Status: DC

## 2016-10-29 NOTE — Progress Notes (Signed)
Patient ID: Diana Dixon, female   DOB: 08-11-1960, 56 y.o.   MRN: [id number]   Reason for Appointment: Diabetes follow-up   History of Present Illness   Diagnosis: Type 1 DIABETES MELITUS, diagnosis made in 1967      She has had long-standing type 1 diabetes with persistently poor control because of variability in blood sugars and difficulty with various self-care issues She has been reluctant to do carbohydrate counting to adjust her insulin doses and has variable readings after meals especially lunch Previously has had times when she would forget her Lantus in the evening and also mealtime dose She may have done a little better with taking Lantus twice a day but does not try to adjust the doses on her own    Recent history:  Insulin regimen: Lantus 17---28, Novolog 7 units at breakfast and 8-9 units at lunch and supper 5-12 hs  She has had persistently poor control of her diabetes despite frequently advising her on insulin changes and better compliance for day-to-day management of diet, insulin regimen and glucose monitoring  A1c today is 8.6, previously as low as 7.4   Current blood sugar patterns and problems identified:  FASTING readings are again fluctuating markedly but overall quite high in averaging over 200  This is despite increasing her Lantus by 4 units in the evening on the last visit  Occasionally does have normal blood sugar in the morning including today; on questioning the patient said that she is frequently eating ice cream or milkshakes late at night and she did not do this last night improved blood sugar  Appears that she will take up to 12 units NovoLog at bedtime with her snack or desserts  Although this has been discussed several times before she does not take her insulin with her when she is going out to eat in the evenings and usually would take her insulin about an hour later  He is still not watching her diet with high carbohydrate intake  especially at breakfast, sometimes high-fat especially when she is eating with her family  She does not restrict drinks with sugar including sweet tea and Dr. Malachi Bonds  Also because of financial reasons she does not check her sugars much and has not been able to start the freestyle Libre sensor even though she has the meter  DIET: May have unbalanced meals like Pop tarts, waffles or breakfast cereal.  May have high fat meats at meals.  Still drinking some regular soft drinks and sweet tea at meals    Glucometer: Contour        Blood Glucose readings from download:   Mean values apply above for all meters except median for One Touch  PRE-MEAL Fasting Lunch Dinner Bedtime Overall  Glucose range: 77-417 61 67  83-415     Mean/median: 223     241     Meals: 3 meals per day.  Eating eggs/meat for breakfast frequently without carbohydrate; dinner 5-6 pm          Physical activity: exercise:  trying to walk  in  pm, usually for 25-20 Minutes but slowly          Wt Readings from Last 3 Encounters:  10/29/16 153 lb (69.4 kg)  05/23/16 152 lb (68.9 kg)  10/11/15 151 lb (68.5 kg)     Lab Results  Component Value Date   HGBA1C 8.6 10/29/2016   HGBA1C 8.2 05/23/2016   HGBA1C 7.4 10/11/2015   Lab  Results  Component Value Date   MICROALBUR 1.7 10/29/2016   LDLCALC 104 (H) 05/23/2016   CREATININE 0.91 05/23/2016    Lab Results  Component Value Date   MICROALBUR 1.7 10/29/2016    Allergies as of 10/29/2016      Reactions   Penicillins    Sulfa Antibiotics       Medication List       Accurate as of 10/29/16  4:14 PM. Always use your most recent med list.          amLODipine-olmesartan 5-20 MG tablet Commonly known as:  AZOR Take 1 tablet by mouth daily.   BAYER CONTOUR NEXT MONITOR w/Device Kit Use to check blood sugar 7 times per day dx code 10.9   BAYER CONTOUR NEXT TEST test strip Generic drug:  glucose blood USE AS INSTRUCTED TO CHECK BLOOD SUGAR 7TIMES A DAY     BAYER MICROLET LANCETS lancets Use as instructed to check blood sugar 7 times per day dx code E10.9   ciprofloxacin 0.3 % ophthalmic solution Commonly known as:  CILOXAN Reported on 06/15/2015   FREESTYLE LIBRE READER Devi 1 Device by Does not apply route as directed. [id number]   FREESTYLE LIBRE SENSOR SYSTEM Misc Apply 1 sensor every 10 days   insulin aspart 100 UNIT/ML FlexPen Commonly known as:  NOVOLOG FLEXPEN INJECT 8 UNITS WITH BREAKFAST, 9 UNITS WITH LUNCH AND 8 UNITS WITH SUPPER   insulin degludec 100 UNIT/ML Sopn FlexTouch Pen Commonly known as:  TRESIBA FLEXTOUCH Inject 0.4 mLs (40 Units total) into the skin daily.   Insulin Glargine 100 UNIT/ML Solostar Pen Commonly known as:  LANTUS SOLOSTAR INJECT 17 UNITS IN THE MORNING AND 27 UNITS IN THE EVENING   Insulin Pen Needle 32G X 4 MM Misc Commonly known as:  BD PEN NEEDLE NANO U/F USE TO INJECT INSULIN AS DIRECTED 4 TIMES A DAY   pantoprazole 40 MG tablet Commonly known as:  PROTONIX TAKE 1 TABLET BY MOUTH ONCE A DAY   PARoxetine 10 MG tablet Commonly known as:  PAXIL Take 1 tablet (10 mg total) by mouth daily.   phentermine 15 MG capsule Take 1 capsule (15 mg total) by mouth every morning.   pravastatin 40 MG tablet Commonly known as:  PRAVACHOL Take 1 tablet (40 mg total) by mouth daily.            Discharge Care Instructions        Start     Ordered   10/29/16 0000  phentermine 15 MG capsule  BH-each morning     10/29/16 1338   10/29/16 0000  Microalbumin / creatinine urine ratio     10/29/16 1340   10/29/16 0000  POCT HgB A1C     10/29/16 1508      Allergies:  Allergies  Allergen Reactions  . Penicillins   . Sulfa Antibiotics     Past Medical History:  Diagnosis Date  . Anxiety   . Depression   . Diabetes mellitus type I ([place])   . Diabetes mellitus without complication (H[facility])    Type I  . Generalized anxiety disorder   . GERD (gastroesophageal reflux disease)   .  Heart murmur   . Hypertension   . Major depressive disorder, single episode, moderate ([place])     Past Surgical History:  Procedure Laterality Date  . ABDOMINAL HYSTERECTOMY    . PARS PLANA VITRECTOMY Left 10/11/2014   Procedure: PARS PLANA VITRECTOMY WITH 25 GAUGE,CATARACT EXTRACTION WITH IOL INSERTION ,  ENDOLASER;  Surgeon: Milus Height, MD;  Location: [facility] ORS;  Service: Ophthalmology;  Laterality: Left;  [place] AP5  CDE CASETTE LOT # [id number] h  . SHOULDER SURGERY    . TONSILLECTOMY    . TONSILLECTOMY      Family History  Problem Relation Age of Onset  . Thyroid disease Mother   . Breast cancer Mother 22  . Hypertension Mother   . Depression Mother   . Anxiety disorder Mother   . Breast cancer Maternal Aunt 87  . Breast cancer Cousin 37    Social History:  reports that she has never smoked. She has never used smokeless tobacco. She reports that she does not drink alcohol or use drugs.  Review of Systems:  HYPERTENSION:  she has had long-standing hypertension treated with Azor.  Blood pressure well controlled   Has history of hyperlipidemia   She is taking pravastatin 40 mg  Previously on 20 mg and the dose was increased   Lab Results  Component Value Date   CHOL 168 05/23/2016   HDL 48.80 05/23/2016   LDLCALC 104 (H) 05/23/2016   LDLDIRECT 88.8 12/22/2013   TRIG 74.0 05/23/2016   CHOLHDL 3 05/23/2016     She has had mild long-standing depression controlled with Paxil and followed by psychiatrist  Eye exam 7/18     Examination:   BP 108/60   Pulse 88   Ht 5' 3"  (1.6 m)   Wt 153 lb (69.4 kg)   SpO2 96%   BMI 27.10 kg/m   Body mass index is 27.1 kg/m.   Repeat blood pressure 140/70  Diabetic Foot Exam - Simple   Simple Foot Form Diabetic Foot exam was performed with the following findings:  Yes   Visual Inspection No deformities, no ulcerations, no other skin breakdown bilaterally:  Yes Sensation Testing Intact to touch and monofilament testing  bilaterally:  Yes Pulse Check Posterior Tibialis and Dorsalis pulse intact bilaterally:  Yes Comments       ASSESSMENT/ PLAN:    Diabetes type 1   See history of present illness for current problems identified, recent blood sugar patterns and her day-to-day management.   Again A1c is over 8% and increasing  She has not been checking her blood sugars as much and mostly in the morning Also snacking in the evenings especially with ice cream or milkshakes is causing her sugars to be higher overnight She is not covering her evening meal taking insulin before eating also usually Although she is a good candidate for the freestyle Libre sensor she cannot afford this   RECOMMENDATIONS:  Discussed need to  Have better control  Most of her poor control is related to her not watching her meal planning, eating excessive snacks and sweets at night and also drinks with sugar with some meals as well as unbalanced or high-fat meals; has poor compliance with pre-meal dosing of insulin  She does not want to see the dietitian because of financial reasons  She is also not very compliant with taking her mealtime coverage before eating supper despite reminders  Since she may benefit from improving her diet she will be given a trial of PHENTERMINE 15 mg daily for the next 3 months.  Discussed that this is a low dose and will improve satiety and cravings  Also may consider adding low-dose Topamax if she has difficulty with satiety continuing  With cutting back on her carbohydrates and nighttime snacks she may need to be cutting back on her  insulin and she will call if needed  Otherwise she can continue the same regimen of insulin  Recommended that she keep her NovoLog pen at her mom's house so she can take insulin before eating consistently  HYPERTENSION: Her blood pressure is  well-controlled  To have microalbumin checked today  LIPIDS: We will check on the next visit  She will get her  influenza vaccine next month locally  She will call her ophthalmologist to have reports of the exam forwarded to [place]  Patient Instructions  Take Novolog BEFORE meals  Check blood sugars on waking up  5/7 days  Also check blood sugars about 2 hours after a meal and do this after different meals by rotation  Recommended blood sugar levels on waking up is 90-130 and about 2 hours after meal is 130-160  Please bring your blood sugar monitor to each visit, thank you  Brisk walking       Rumi Kolodziej 10/29/2016, 4:14 PM

## 2016-10-29 NOTE — Patient Instructions (Signed)
Take Novolog BEFORE meals  Check blood sugars on waking up  5/7 days  Also check blood sugars about 2 hours after a meal and do this after different meals by rotation  Recommended blood sugar levels on waking up is 90-130 and about 2 hours after meal is 130-160  Please bring your blood sugar monitor to each visit, thank you  Brisk walking

## 2016-10-30 ENCOUNTER — Telehealth: Payer: Self-pay | Admitting: Endocrinology

## 2016-10-30 ENCOUNTER — Other Ambulatory Visit: Payer: Self-pay

## 2016-10-30 MED ORDER — INSULIN ASPART 100 UNIT/ML FLEXPEN: PEN_INJECTOR | SUBCUTANEOUS | 2 refills | Status: DC

## 2016-10-30 NOTE — Telephone Encounter (Signed)
Please advise on a dosage change so that I can change her prescription

## 2016-10-30 NOTE — Telephone Encounter (Signed)
Change the dosage to up to 15 units before each meal and give her 2 boxes of the NovoLog FlexPen

## 2016-10-30 NOTE — Telephone Encounter (Signed)
Needs new script for Novolog. The patient is unable to fill her recent script until 09/24 b/c it's too soon.   Sometimes she has to use more than prescribed. If it continues to be prescribed in the way it was, the pharmacy won't be able to fill it.  Please advise,  -LL

## 2016-10-30 NOTE — Telephone Encounter (Signed)
Spoke with the patient about the dosage change and I changed her prescription- this has been resolved

## 2016-11-04 ENCOUNTER — Other Ambulatory Visit: Payer: Self-pay

## 2016-11-04 ENCOUNTER — Telehealth: Payer: Self-pay

## 2016-11-04 MED ORDER — INSULIN GLARGINE 300 UNIT/ML ~~LOC~~ SOPN: 44.0000 [IU] | PEN_INJECTOR | Freq: Every day | SUBCUTANEOUS | 2 refills | Status: DC

## 2016-11-04 NOTE — Telephone Encounter (Signed)
Left vm letting patient know that I sent in a new prescription for toujeo to take the place of the Lantus that her insurance will not cover- I requested a call back if she had questions

## 2016-12-11 ENCOUNTER — Other Ambulatory Visit: Payer: Self-pay

## 2016-12-15 ENCOUNTER — Ambulatory Visit: Payer: Self-pay | Admitting: Endocrinology

## 2016-12-17 ENCOUNTER — Other Ambulatory Visit: Payer: Self-pay | Admitting: Endocrinology

## 2017-01-05 ENCOUNTER — Ambulatory Visit: Payer: BLUE CROSS/BLUE SHIELD | Admitting: Psychiatry

## 2017-01-05 DIAGNOSIS — J209 Acute bronchitis, unspecified: Secondary | ICD-10-CM | POA: Diagnosis not present

## 2017-01-16 ENCOUNTER — Other Ambulatory Visit: Payer: Self-pay | Admitting: Psychiatry

## 2017-01-19 ENCOUNTER — Ambulatory Visit: Payer: Self-pay | Admitting: Psychiatry

## 2017-01-21 ENCOUNTER — Other Ambulatory Visit: Payer: Self-pay | Admitting: Psychiatry

## 2017-01-28 ENCOUNTER — Encounter: Payer: Self-pay | Admitting: Endocrinology

## 2017-01-28 ENCOUNTER — Ambulatory Visit: Payer: BLUE CROSS/BLUE SHIELD | Admitting: Endocrinology

## 2017-01-28 ENCOUNTER — Ambulatory Visit: Payer: Self-pay | Admitting: Endocrinology

## 2017-01-28 VITALS — BP 130/66 | HR 86 | Ht 63.0 in | Wt 151.6 lb

## 2017-01-28 DIAGNOSIS — E7800 Pure hypercholesterolemia, unspecified: Secondary | ICD-10-CM | POA: Diagnosis not present

## 2017-01-28 DIAGNOSIS — E1065 Type 1 diabetes mellitus with hyperglycemia: Secondary | ICD-10-CM | POA: Diagnosis not present

## 2017-01-28 DIAGNOSIS — I10 Essential (primary) hypertension: Secondary | ICD-10-CM

## 2017-01-28 LAB — POCT GLYCOSYLATED HEMOGLOBIN (HGB A1C): Hemoglobin A1C: 8

## 2017-01-28 NOTE — Patient Instructions (Signed)
Novolog before meals ALWAYS  Try dividing total carbs by 10 for Novolog Plus 2-4 units for hi fat

## 2017-01-28 NOTE — Progress Notes (Signed)
Patient ID: Diana Dixon, female   DOB: 1960/10/22, 56 y.o.   MRN: [id number]   Reason for Appointment: Diabetes follow-up   History of Present Illness   Diagnosis: Type 1 DIABETES MELITUS, diagnosis made in 1967      She has had long-standing type 1 diabetes with persistently poor control because of variability in blood sugars and difficulty with various self-care issues She has been reluctant to do carbohydrate counting to adjust her insulin doses and has variable readings after meals especially lunch Previously has had times when she would forget her Lantus in the evening and also mealtime dose She may have done a little better with taking Lantus twice a day but does not try to adjust the doses on her own    Recent history:  Insulin regimen: Lantus 17---28, Novolog 7 units at breakfast and 8-9 units at lunch and supper 5-12 hs  She has had persistently poor control of her diabetes despite frequently advising her on insulin changes and better compliance for day-to-day management of diet, insulin regimen and glucose monitoring  A1c today is 8, previously 8.6 and has been as low as 7. 4  Current blood sugar patterns and problems identified:  She is still not understanding the need to take her NOVOLOG before eating especially in the evening  Her highest blood sugars are AFTER supper and she is still usually waiting to check her sugar and hour after eating to take her NovoLog  FASTING readings are difficult to assess because she is waking up at different times and not marking the readings before and after; still appears to be having some relatively high readings which are inconsistent  she is also not estimating her insulin intake based on what she is eating  Today she had minimal carbohydrates and only eggs and bacon at breakfast and took 8 units of NovoLog after eating causing her blood sugar to be low and she took another 8 units for her lunch and about an hour and a  half later causing low sugars again  She will occasionally have low sugars in the early afternoon and not clear if this is before or after lunch  Her blood sugars were not labeled as before and after eating  HIGHEST blood sugars are again after supper and bedtime  She refuses to consider consultation with diabetes educator again and also insulin pump  She was using the LIBRE sensor last month but stopped because of cost, her highest sugars on an average or between 6 PM-midnight at around 180 but better the rest of the day  DIET: May have unbalanced meals like Pop tarts, waffles or breakfast cereal.  May have high fat meats at meals.   Is  some regular soft drinks and sweet tea at meals, Less consistently and less at breakfast    Glucometer: Contour Next         Blood Glucose readings from download:   Mean values apply above for all meters except median for One Touch  PRE-MEAL MORNING  Lunch Afternoon  6 PM +  Overall  Glucose range: 53-314  55-4 95  50-3 03  103-402    Mean/median: 139  183  157  261  186      Meals: 3 meals per day.  Eating eggs/meat for breakfast frequently without carbohydrate; dinner 5-6 pm          Physical activity: exercise:  trying to walk  in  pm, usually for 25-20 Minutes  but slowly          Wt Readings from Last 3 Encounters:  01/28/17 151 lb 9.6 oz (68.8 kg)  10/29/16 153 lb (69.4 kg)  05/23/16 152 lb (68.9 kg)     Lab Results  Component Value Date   HGBA1C 8.0 01/28/2017   HGBA1C 8.6 10/29/2016   HGBA1C 8.2 05/23/2016   Lab Results  Component Value Date   MICROALBUR 1.7 10/29/2016   LDLCALC 104 (H) 05/23/2016   CREATININE 0.91 05/23/2016    Lab Results  Component Value Date   MICROALBUR 1.7 10/29/2016    Allergies as of 01/28/2017      Reactions   Penicillins    Sulfa Antibiotics       Medication List        Accurate as of 01/28/17  8:41 PM. Always use your most recent med list.          amLODipine-olmesartan 5-20 MG  tablet Commonly known as:  AZOR Take 1 tablet by mouth daily.   BAYER CONTOUR NEXT MONITOR w/Device Kit Use to check blood sugar 7 times per day dx code 10.9   BAYER MICROLET LANCETS lancets Use as instructed to check blood sugar 7 times per day dx code E10.9   ciprofloxacin 0.3 % ophthalmic solution Commonly known as:  CILOXAN Reported on 06/15/2015   CONTOUR NEXT TEST test strip Generic drug:  glucose blood USE AS INSTRUCTED TO CHECK BLOOD SUGAR 7TIMES A DAY   FREESTYLE LIBRE READER Devi 1 Device by Does not apply route as directed. [id number]   FREESTYLE LIBRE SENSOR SYSTEM Misc Apply 1 sensor every 10 days   insulin aspart 100 UNIT/ML FlexPen Commonly known as:  NOVOLOG FLEXPEN INJECT 15 UNITS BEFORE EACH MEAL   Insulin Glargine 100 UNIT/ML Solostar Pen Commonly known as:  LANTUS SOLOSTAR INJECT 17 UNITS IN THE MORNING AND 27 UNITS IN THE EVENING   Insulin Pen Needle 32G X 4 MM Misc Commonly known as:  BD PEN NEEDLE NANO U/F USE TO INJECT INSULIN AS DIRECTED 4 TIMES A DAY   pantoprazole 40 MG tablet Commonly known as:  PROTONIX TAKE 1 TABLET BY MOUTH ONCE A DAY   PARoxetine 10 MG tablet Commonly known as:  PAXIL Take 1 tablet (10 mg total) by mouth daily.   phentermine 15 MG capsule Take 1 capsule (15 mg total) by mouth every morning.   pravastatin 40 MG tablet Commonly known as:  PRAVACHOL Take 1 tablet (40 mg total) by mouth daily.       Allergies:  Allergies  Allergen Reactions  . Penicillins   . Sulfa Antibiotics     Past Medical History:  Diagnosis Date  . Anxiety   . Depression   . Diabetes mellitus type I ([place])   . Diabetes mellitus without complication ([facility])    Type I  . Generalized anxiety disorder   . GERD (gastroesophageal reflux disease)   . Heart murmur   . Hypertension   . Major depressive disorder, single episode, moderate ([place])     Past Surgical History:  Procedure Laterality Date  . ABDOMINAL HYSTERECTOMY    .  PARS PLANA VITRECTOMY Left 10/11/2014   Procedure: PARS PLANA VITRECTOMY WITH 25 GAUGE,CATARACT EXTRACTION WITH IOL INSERTION , ENDOLASER;  Surgeon: Milus Height, MD;  Location: [facility] ORS;  Service: Ophthalmology;  Laterality: Left;  [place] AP5  CDE CASETTE LOT # [id number] h  . SHOULDER SURGERY    . TONSILLECTOMY    . TONSILLECTOMY  Family History  Problem Relation Age of Onset  . Thyroid disease Mother   . Breast cancer Mother 32  . Hypertension Mother   . Depression Mother   . Anxiety disorder Mother   . Breast cancer Maternal Aunt 52  . Breast cancer Cousin 72    Social History:  reports that  has never smoked. she has never used smokeless tobacco. She reports that she does not drink alcohol or use drugs.  Review of Systems:  WEIGHT loss:  She says that she was benefiting somewhat from taking phentermine and having less tendency to snack However when she had a respiratory infection about a month ago she stopped taking it and has not gone back on it, she thinks she will do better with higher doses  Wt Readings from Last 3 Encounters:  01/28/17 151 lb 9.6 oz (68.8 kg)  10/29/16 153 lb (69.4 kg)  05/23/16 152 lb (68.9 kg)   HYPERTENSION:  she has had long-standing hypertension treated with Azor.  Blood pressure well controlled   Has history of hyperlipidemia   She is taking pravastatin 40 mg At night, she thinks it causes back pain and not after taking this  labs not available   Lab Results  Component Value Date   CHOL 168 05/23/2016   HDL 48.80 05/23/2016   LDLCALC 104 (H) 05/23/2016   LDLDIRECT 88.8 12/22/2013   TRIG 74.0 05/23/2016   CHOLHDL 3 05/23/2016     She has had mild long-standing depression controlled with Paxil and followed by psychiatrist  Eye exam 7/18     Examination:   BP 130/66   Pulse 86   Ht _0  (1.6 m)   Wt 151 lb 9.6 oz (68.8 kg)   SpO2 97%   PF (!) 7 L/min   BMI 26.85 kg/m   Body mass index is 26.85 kg/m.      ASSESSMENT/  PLAN:    Diabetes type 1 Persistently poorly controlled  See history of present illness for current problems identified, recent blood sugar patterns and her day-to-day management.   A1c is slightly better at 8%  Her blood sugars are still erratic and showing no consistent pattern especially in the mornings and early afternoons However because of her not taking her mealtime insulin before eating she has consistently high readings after evening meal usually Difficult to identify fasting or postprandial readings in the mornings as she is not labeling them Most likely is not having consistently high readings fasting with current regimen of Lantus  She has however check her sugars more often than before; not able to use the freestyle Elenor Legato recently because of cost She does need basic diabetes education but she refuses She doesn't understand the adjustment of mealtime insulin based on, hydrocodone 10 and high fat meals Also she is not clear about the action profile of mealtime insulin and the need to take it before eating consistently   RECOMMENDATIONS:  Discussed need to watch her carbohydrates and adjust her NovoLog based on this  She will try to use the calorie [employer] Pace application on her phone to dose her insulin at mealtimes, for simplicity can try 4:07 coverage  She will add another 2-4 units for higher fat meals  Showed her the normal blood sugar patterns after meals and explained to her the need for taking NOVOLOG before eating consistently especially in the evening  She can continue phentermine as it may help her with controlling portions, sweets and carbohydrates  Does not advise higher  dose because of potential for hypertension  She needs to label her readings before and after meals and her settings were changed on the meter today  She may need only 2-4 units for eating breakfast in the morning when she has no carbohydrate  No change in Lantus as yet  HYPERTENSION: Her blood  pressure is  well-controlled   LIPIDS: We will recheck today, recommend she continue 40 mg pravastatin, stated to her that it is unlikely she has back pain and hour later from taking just this medication  WEIGHT control: She has a prescription for phentermine and she can continue this, do not think she needs an increase in dosage   Counseling time on subjects discussed in assessment and plan sections is over 50% of today's 25 minute visit   Patient Instructions  Novolog before meals ALWAYS  Try dividing total carbs by 10 for Novolog Plus 2-4 units for hi fat      Elayne Snare 01/28/2017, 8:41 PM

## 2017-02-02 ENCOUNTER — Encounter: Payer: Self-pay | Admitting: Psychiatry

## 2017-02-02 ENCOUNTER — Other Ambulatory Visit: Payer: Self-pay

## 2017-02-02 ENCOUNTER — Ambulatory Visit (INDEPENDENT_AMBULATORY_CARE_PROVIDER_SITE_OTHER): Payer: BLUE CROSS/BLUE SHIELD | Admitting: Psychiatry

## 2017-02-02 VITALS — BP 145/69 | HR 83 | Temp 99.1°F | Wt 151.8 lb

## 2017-02-02 DIAGNOSIS — F3341 Major depressive disorder, recurrent, in partial remission: Secondary | ICD-10-CM | POA: Diagnosis not present

## 2017-02-02 MED ORDER — PAROXETINE HCL 10 MG PO TABS: 10.0000 mg | ORAL_TABLET | Freq: Every day | ORAL | 1 refills | Status: DC
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
## 2017-02-06 ENCOUNTER — Other Ambulatory Visit: Payer: Self-pay | Admitting: Endocrinology

## 2017-02-09 ENCOUNTER — Other Ambulatory Visit: Payer: Self-pay | Admitting: Endocrinology

## 2017-02-09 ENCOUNTER — Other Ambulatory Visit: Payer: Self-pay | Admitting: Obstetrics and Gynecology

## 2017-02-09 DIAGNOSIS — Z1231 Encounter for screening mammogram for malignant neoplasm of breast: Secondary | ICD-10-CM | POA: Diagnosis not present

## 2017-02-09 DIAGNOSIS — R87610 Atypical squamous cells of undetermined significance on cytologic smear of cervix (ASC-US): Secondary | ICD-10-CM | POA: Diagnosis not present

## 2017-02-09 DIAGNOSIS — Z1272 Encounter for screening for malignant neoplasm of vagina: Secondary | ICD-10-CM | POA: Diagnosis not present

## 2017-02-09 DIAGNOSIS — Z01419 Encounter for gynecological examination (general) (routine) without abnormal findings: Secondary | ICD-10-CM | POA: Diagnosis not present

## 2017-02-09 DIAGNOSIS — Z1211 Encounter for screening for malignant neoplasm of colon: Secondary | ICD-10-CM | POA: Diagnosis not present

## 2017-03-19 ENCOUNTER — Ambulatory Visit
Admission: RE | Admit: 2017-03-19 | Discharge: 2017-03-19 | Disposition: A | Discharge status: Home / Self Care | Payer: BLUE CROSS/BLUE SHIELD | Source: Ambulatory Visit | Attending: Obstetrics and Gynecology | Admitting: Obstetrics and Gynecology

## 2017-03-19 DIAGNOSIS — Z1231 Encounter for screening mammogram for malignant neoplasm of breast: Secondary | ICD-10-CM | POA: Insufficient documentation

## 2017-04-20 ENCOUNTER — Other Ambulatory Visit: Payer: Self-pay | Admitting: Endocrinology

## 2017-04-28 ENCOUNTER — Ambulatory Visit: Payer: Self-pay | Admitting: Endocrinology

## 2017-05-21 ENCOUNTER — Ambulatory Visit: Payer: Self-pay | Admitting: Endocrinology

## 2017-06-25 ENCOUNTER — Ambulatory Visit: Payer: Self-pay | Admitting: Endocrinology

## 2017-07-16 ENCOUNTER — Other Ambulatory Visit: Payer: Self-pay | Admitting: Endocrinology

## 2017-07-30 ENCOUNTER — Encounter: Payer: Self-pay | Admitting: Psychiatry

## 2017-07-30 ENCOUNTER — Ambulatory Visit (INDEPENDENT_AMBULATORY_CARE_PROVIDER_SITE_OTHER): Payer: BLUE CROSS/BLUE SHIELD | Admitting: Psychiatry

## 2017-07-30 ENCOUNTER — Ambulatory Visit: Payer: Self-pay | Admitting: Psychiatry

## 2017-07-30 VITALS — BP 120/70 | HR 83 | Ht 63.0 in | Wt 151.8 lb

## 2017-07-30 DIAGNOSIS — F330 Major depressive disorder, recurrent, mild: Secondary | ICD-10-CM

## 2017-07-30 MED ORDER — PAROXETINE HCL 20 MG PO TABS: 20.0000 mg | ORAL_TABLET | Freq: Every day | ORAL | 1 refills | Status: DC

## 2017-07-30 NOTE — Progress Notes (Signed)
[facility] MD OP Progress Note  07/30/2017 4:39 PM [name]  MRN:  [id number]  Chief Complaint: ' I am here for follow up." Chief Complaint    Follow-up     HPI: Diana Dixon is a 57 year old Caucasian female who is widowed, employed, lives in [place], has a history of depression, diabetes mellitus, hypertension, hypercholesterolemia, presented to the clinic today for a follow-up visit.  Patient used to follow up with Dr. Gretel Acre in the past.  Patient today reports she is tolerating the Paxil regular one well.  However wonders whether she needs a dosage increase since it is not the extended release anymore.  She however is worried about side effects.  She reports some recent psychosocial stressors.  She reports she owes credit card companies and hence she may lose her home.  Reports that has been making her depressed.  She however denies any suicidality.  She reports sleep is okay.  She does report she is worried about her situation.  She does have good social support from her mother and her daughter.  She reports she is looking for other jobs that she can do.  Patient denies any other concerns today. Visit Diagnosis:    ICD-10-CM   1. MDD (major depressive disorder), recurrent episode, mild ([facility]) F33.0     Past Psychiatric History: Reviewed past psychiatric history from my progress note on 02/02/2017  Past Medical History:  Past Medical History:  Diagnosis Date  . Anxiety   . Depression   . Diabetes mellitus type I ([place])   . Diabetes mellitus without complication (HCC)    Type I  . Generalized anxiety disorder   . GERD (gastroesophageal reflux disease)   . Heart murmur   . Hypertension   . Major depressive disorder, single episode, moderate ([place])     Past Surgical History:  Procedure Laterality Date  . ABDOMINAL HYSTERECTOMY    . PARS PLANA VITRECTOMY Left 10/11/2014   Procedure: PARS PLANA VITRECTOMY WITH 25 GAUGE,CATARACT EXTRACTION WITH IOL INSERTION , ENDOLASER;  Surgeon:  Milus Height, MD;  Location: [facility] ORS;  Service: Ophthalmology;  Laterality: Left;  [place] AP5  CDE CASETTE LOT # [id number] h  . SHOULDER SURGERY    . TONSILLECTOMY    . TONSILLECTOMY      Family Psychiatric History: I have reviewed family psychiatric history from my progress note on 02/02/2017  Family History:  Family History  Problem Relation Age of Onset  . Thyroid disease Mother   . Breast cancer Mother 24  . Hypertension Mother   . Depression Mother   . Anxiety disorder Mother   . Breast cancer Maternal Aunt 44  . Breast cancer Cousin 12   Substance abuse history: Denies  Social History: She is married x2, widowed.  She currently works part-time at [employer]  She has 1 daughter and a new grand baby who is 66 weeks old.  She is a single child lives close to her mother in [place].  She has a good relationship with her mother and her daughter. Social History   Socioeconomic History  . Marital status: Widowed    Spouse name: Not on file  . Number of children: 1  . Years of education: Not on file  . Highest education level: Some college, no degree  Occupational History    Comment: part time  Social Needs  . Financial resource strain: Somewhat hard  . Food insecurity:    Worry: Never true    Inability: Never true  . Transportation needs:  Medical: No    Non-medical: No  Tobacco Use  . Smoking status: Never Smoker  . Smokeless tobacco: Never Used  Substance and Sexual Activity  . Alcohol use: No    Alcohol/week: 0.0 oz  . Drug use: No  . Sexual activity: Not Currently    Partners: Male  Lifestyle  . Physical activity:    Days per week: 4 days    Minutes per session: 20 min  . Stress: Not at all  Relationships  . Social connections:    Talks on phone: More than three times a week    Gets together: More than three times a week    Attends religious service: More than 4 times per year    Active member of club or organization: No    Attends meetings of clubs  or organizations: Never    Relationship status: Widowed  Other Topics Concern  . Not on file  Social History Narrative   Regular exercise: walk   Caffeine use: sodas    Allergies:  Allergies  Allergen Reactions  . Penicillins   . Sulfa Antibiotics     Metabolic Disorder Labs: Lab Results  Component Value Date   HGBA1C 8.0 01/28/2017   No results found for: PROLACTIN Lab Results  Component Value Date   CHOL 168 05/23/2016   TRIG 74.0 05/23/2016   HDL 48.80 05/23/2016   CHOLHDL 3 05/23/2016   VLDL 14.8 05/23/2016   LDLCALC 104 (H) 05/23/2016   LDLCALC 86 11/20/2014   Lab Results  Component Value Date   TSH 0.69 12/22/2013    Therapeutic Level Labs: No results found for: LITHIUM No results found for: VALPROATE No components found for:  CBMZ  Current Medications: Current Outpatient Medications  Medication Sig Dispense Refill  . amLODipine-olmesartan (AZOR) 5-20 MG tablet TAKE 1 TABLET BY MOUTH ONCE DAILY 30 tablet 2  . BAYER MICROLET LANCETS lancets Use as instructed to check blood sugar 7 times per day dx code E10.9 200 each 3  . Blood Glucose Monitoring Suppl (BAYER CONTOUR NEXT MONITOR) w/Device KIT Use to check blood sugar 7 times per day dx code 10.9 1 kit 0  . Continuous Blood Gluc Receiver (FREESTYLE LIBRE READER) DEVI 1 Device by Does not apply route as directed. [id number] 1 Device 1  . Continuous Blood Gluc Sensor (FREESTYLE LIBRE SENSOR SYSTEM) MISC Apply 1 sensor every 10 days 3 each 4  . CONTOUR NEXT TEST test strip USE AS INSTRUCTED TO CHECK BLOOD SUGAR 7TIMES A DAY 100 each 5  . Insulin Glargine (LANTUS SOLOSTAR) 100 UNIT/ML Solostar Pen INJECT 17 UNITS IN THE MORNING AND 27 UNITS IN THE EVENING (Patient taking differently: INJECT 17 UNITS IN THE MORNING AND 28 UNITS IN THE EVENING) 45 mL 2  . Insulin Pen Needle (BD PEN NEEDLE NANO U/F) 32G X 4 MM MISC USE TO INJECT INSULIN AS DIRECTED 4 TIMES A DAY 200 each 2  . NOVOLOG FLEXPEN 100 UNIT/ML  FlexPen INJECT 15 UNITS BEFORE EACH MEAL 30 mL 2  . pantoprazole (PROTONIX) 40 MG tablet TAKE 1 TABLET BY MOUTH ONCE A DAY 30 tablet 2  . phentermine 15 MG capsule Take 1 capsule (15 mg total) by mouth every morning. 30 capsule 2  . pravastatin (PRAVACHOL) 40 MG tablet TAKE ONE TABLET BY MOUTH DAILY 30 tablet 3  . PARoxetine (PAXIL) 20 MG tablet Take 1 tablet (20 mg total) by mouth daily. 90 tablet 1   No current facility-administered medications for this visit.  Musculoskeletal: Strength & Muscle Tone: within normal limits Gait & Station: normal Patient leans: N/A  Psychiatric Specialty Exam: Review of Systems  Psychiatric/Behavioral: Positive for depression.  All other systems reviewed and are negative.   Blood pressure 120/70, pulse 83, height 5' 3"  (1.6 m), weight 151 lb 12.8 oz (68.9 kg), SpO2 95 %.Body mass index is 26.89 kg/m.  General Appearance: Casual  Eye Contact:  Fair  Speech:  Clear and Coherent  Volume:  Normal  Mood:  Dysphoric  Affect:  Congruent  Thought Process:  Goal Directed and Descriptions of Associations: Intact  Orientation:  Full (Time, Place, and Person)  Thought Content: Logical   Suicidal Thoughts:  No  Homicidal Thoughts:  No  Memory:  Immediate;   Fair Recent;   Fair Remote;   Fair  Judgement:  Fair  Insight:  Fair  Psychomotor Activity:  Normal  Concentration:  Concentration: Fair and Attention Span: Fair  Recall:  [employer] of Knowledge: Fair  Language: Fair  Akathisia:  No  Handed:  Right  AIMS (if indicated): na  Assets:  Communication Skills Desire for Improvement Social Support  ADL's:  Intact  Cognition: WNL  Sleep:  Fair   Screenings:   Assessment and Plan: Mannat is a 57 year old Caucasian female who has a history of depression, presented to the clinic today for a follow-up visit.    She reports however since the Paxil extended release was changed to a regular one she has noticed some changes in her mood.  She  wonders whether it is due to the change in the form of the medication.  She hence would like to try a higher dose to see if that will give her the same effect as the Paxil CR.  Discussed with patient that she can be given a higher dose of 20 mg.  She however has the option to cut it into half and take 10 mg if she notices any side effects.  Plan For depression Increase Paxil to 20 mg p.o. daily.  Patient advised to cut it into half and take 10 mg if she notices any side effects.  Provided supportive psychotherapy.  Offered her information about vocational rehabilitation.  Reports she wants to continue to follow-up in clinic every 5-6 months.  Diascussed with her to follow-up in clinic sooner if she notices any worsening symptoms.  More than 50 % of the time was spent for psychoeducation and supportive psychotherapy and care coordination.  This note was generated in part or whole with voice recognition software. Voice recognition is usually quite accurate but there are transcription errors that can and very often do occur. I apologize for any typographical errors that were not detected and corrected.         Ursula Alert, MD 07/31/2017, 1:29 PM

## 2017-07-31 ENCOUNTER — Encounter: Payer: Self-pay | Admitting: Psychiatry

## 2017-07-31 MED ORDER — PAROXETINE HCL 20 MG PO TABS
20.0000 mg | ORAL_TABLET | Freq: Every day | ORAL | 1 refills | Status: DC
Start: 2017-07-31 — End: 2018-01-28

## 2017-08-06 ENCOUNTER — Other Ambulatory Visit: Payer: Self-pay | Admitting: Endocrinology

## 2017-08-10 ENCOUNTER — Other Ambulatory Visit: Payer: Self-pay | Admitting: Endocrinology

## 2017-08-21 ENCOUNTER — Other Ambulatory Visit: Payer: Self-pay | Admitting: Endocrinology

## 2017-08-25 NOTE — Progress Notes (Signed)
Patient ID: Diana Dixon, female   DOB: [birth year], 57 y.o.   MRN: [id number]   Reason for Appointment: Diabetes follow-up   History of Present Illness   Diagnosis: Type 1 DIABETES MELITUS, diagnosis made in 1967      She has had long-standing type 1 diabetes with persistently poor control because of variability in blood sugars and difficulty with various self-care issues She has been reluctant to do carbohydrate counting to adjust her insulin doses and has variable readings after meals especially lunch Previously has had times when she would forget her Lantus in the evening and also mealtime dose She may have done a little better with taking Lantus twice a day but does not try to adjust the doses on her own    Recent history:  Insulin regimen: Lantus 17---28, Novolog 7 units at breakfast and 8-9 units at lunch and supper 5-12 hs  She has had persistently poor control of her diabetes despite frequently advising her on insulin changes and better compliance for day-to-day management of diet, insulin regimen and glucose monitoring  A1c today is 7.8, previously as high as 8.6 in 2018 and has been as low as 7. 4  Current blood sugar patterns and problems identified:  Because of the cost of the test strips he is only checking blood sugars in the mornings and sometimes at lunch  Her blood sugars are fluctuating markedly overnight  She thinks that blood sugar fluctuation is related to her variable diet, eating sweets or snacks late at night and sometimes higher from stress  She takes as much as 10 units of NovoLog when she is eating snacks like ice cream at bedtime  However she will usually take her insulin including mealtime doses of NOVOLOG after eating, sometimes 10 minutes later  Only when her blood sugars are significantly high when she wakes up she will take her NovoLog at a time  Today she had hypoglycemia late morning, she had taken 8 units of insulin and had no  carbohydrate at breakfast which was eggs and meat  Previously had used the freestyle libre and stopped this because of higher cost  She refusing to consider changing her Lantus, does not want to make any change in her prescriptions  She is generally drinking regular soft drinks and will not switch to diet  DIET: May have unbalanced meals like Pop tarts, waffles or breakfast cereal.  May have high fat meats at meals.   Is  some regular soft drinks and sweet tea at meals, Less consistently and less at breakfast    Glucometer: Contour Next         Blood Glucose readings from download:   Mean values apply above for all meters except median for One Touch  PRE-MEAL Fasting  midday Dinner Bedtime Overall  Glucose range:  56-365  52-291   312   Mean/median:  190  178       PREVIOUS readings  PRE-MEAL MORNING  Lunch Afternoon  6 PM +  Overall  Glucose range: 53-314  55-4 95  50-3 03  103-402    Mean/median: 139  183  157  261  186      Meals: 3 meals per day.  Eating eggs/meat for breakfast frequently without carbohydrate; dinner 5-6 pm          Physical activity: exercise:  trying to walk daily  Wt Readings from Last 3 Encounters:  08/26/17 151 lb (68.5 kg)  01/28/17 151 lb  9.6 oz (68.8 kg)  10/29/16 153 lb (69.4 kg)     Lab Results  Component Value Date   HGBA1C 8.0 01/28/2017   HGBA1C 8.6 10/29/2016   HGBA1C 8.2 05/23/2016   Lab Results  Component Value Date   MICROALBUR 1.7 10/29/2016   LDLCALC 104 (H) 05/23/2016   CREATININE 0.91 05/23/2016    Lab Results  Component Value Date   MICROALBUR 1.7 10/29/2016    Allergies as of 08/26/2017      Reactions   Penicillins    Sulfa Antibiotics       Medication List        Accurate as of 08/26/17  1:22 PM. Always use your most recent med list.          amLODipine-olmesartan 5-20 MG tablet Commonly known as:  AZOR TAKE 1 TABLET BY MOUTH ONCE DAILY   BAYER CONTOUR NEXT MONITOR w/Device Kit Use to check blood  sugar 7 times per day dx code 10.9   BAYER MICROLET LANCETS lancets Use as instructed to check blood sugar 7 times per day dx code E10.9   CONTOUR NEXT TEST test strip Generic drug:  glucose blood USE AS INSTRUCTED TO CHECK BLOOD SUGAR 7TIMES A DAY   FREESTYLE LIBRE READER Devi 1 Device by Does not apply route as directed. [id number]   FREESTYLE LIBRE SENSOR SYSTEM Misc Apply 1 sensor every 10 days   Insulin Glargine 100 UNIT/ML Solostar Pen Commonly known as:  LANTUS SOLOSTAR INJECT 17 UNITS IN THE MORNING AND 28 UNITS IN THE EVENING   Insulin Pen Needle 32G X 4 MM Misc Commonly known as:  BD PEN NEEDLE NANO U/F USE TO INJECT INSULIN AS DIRECTED 4 TIMES A DAY   NOVOLOG FLEXPEN 100 UNIT/ML FlexPen Generic drug:  insulin aspart INJECT 15 UNITS BEFORE EACH MEAL   pantoprazole 40 MG tablet Commonly known as:  PROTONIX TAKE 1 TABLET BY MOUTH ONCE A DAY   PARoxetine 20 MG tablet Commonly known as:  PAXIL Take 1 tablet (20 mg total) by mouth daily.   phentermine 15 MG capsule Take 1 capsule (15 mg total) by mouth every morning.   pravastatin 40 MG tablet Commonly known as:  PRAVACHOL TAKE 1 TABLET BY MOUTH ONCE DAILY       Allergies:  Allergies  Allergen Reactions  . Penicillins   . Sulfa Antibiotics     Past Medical History:  Diagnosis Date  . Anxiety   . Depression   . Diabetes mellitus type I ([place])   . Diabetes mellitus without complication (H[facility])    Type I  . Generalized anxiety disorder   . GERD (gastroesophageal reflux disease)   . Heart murmur   . Hypertension   . Major depressive disorder, single episode, moderate ([place])     Past Surgical History:  Procedure Laterality Date  . ABDOMINAL HYSTERECTOMY    . PARS PLANA VITRECTOMY Left 10/11/2014   Procedure: PARS PLANA VITRECTOMY WITH 25 GAUGE,CATARACT EXTRACTION WITH IOL INSERTION , ENDOLASER;  Surgeon: Milus Height, MD;  Location: [facility] ORS;  Service: Ophthalmology;  Laterality: Left;   [place] AP5  CDE CASETTE LOT # [id number] h  . SHOULDER SURGERY    . TONSILLECTOMY    . TONSILLECTOMY      Family History  Problem Relation Age of Onset  . Thyroid disease Mother   . Breast cancer Mother 17  . Hypertension Mother   . Depression Mother   . Anxiety disorder Mother   . Breast cancer Maternal Aunt  21  . Breast cancer Cousin 42    Social History:  reports that she has never smoked. She has never used smokeless tobacco. She reports that she does not drink alcohol or use drugs.  Review of Systems:  WEIGHT loss:  She was taking phentermine before but does not appear to have benefited with any significant weight loss  Wt Readings from Last 3 Encounters:  08/26/17 151 lb (68.5 kg)  01/28/17 151 lb 9.6 oz (68.8 kg)  10/29/16 153 lb (69.4 kg)   HYPERTENSION:  she has had long-standing hypertension treated with Azor.  Blood pressure consistently well controlled   Has history of hyperlipidemia   She is taking pravastatin 40 mg  No recent labs not available   Lab Results  Component Value Date   CHOL 168 05/23/2016   HDL 48.80 05/23/2016   LDLCALC 104 (H) 05/23/2016   LDLDIRECT 88.8 12/22/2013   TRIG 74.0 05/23/2016   CHOLHDL 3 05/23/2016     She has had mild long-standing depression controlled with Paxil and followed by psychiatrist   She is asking about taking her thyroid because a family history and fatigue at times  Eye exam 7/18     Examination:   BP (!) 132/58   Pulse 84   Ht 5' 3"  (1.6 m)   Wt 151 lb (68.5 kg)   SpO2 97%   BMI 26.75 kg/m   Body mass index is 26.75 kg/m.   Thyroid not palpable   ASSESSMENT/ PLAN:    Diabetes type 1 Persistently poorly controlled  See history of present illness for current problems identified, recent blood sugar patterns and her day-to-day management.   A1c is slightly better at 7.8%  As discussed above her day-to-day management is still not optimal with poor diet, not adjusting her NovoLog appropriately  based on her carbohydrates, not counting carbohydrates as discussed previously and variable action of Lantus from day-to-day She is very resistant to change Also can do much better with monitoring at different times as well as timing of her NovoLog insulin  RECOMMENDATIONS:  She will try to do her NovoLog injection before eating as long as she is aware of what she is eating  Start cutting back on sweets and excessive snacks especially late at night  Reduce NovoLog to at least 4 or 5 minutes when not eating carbohydrates in the morning and also if planning to be very active  Start rotating when she checks her blood sugars and do more readings after meals  Have given co-pay cards for insulin and test strips  Again recommended that she try [place] instead of Lantus for more consistent action but she refuses to change  May also consider doing freestyle libre at some point when she can afford this  HYPERTENSION: Her blood pressure is  well-controlled   LIPIDS: We will recheck today, needs to be on pravastatin long-term    Counseling time on subjects discussed in assessment and plan sections is over 50% of today's 25 minute visit   There are no Patient Instructions on file for this visit.     Elayne Snare 08/26/2017, 1:22 PM

## 2017-08-26 ENCOUNTER — Encounter: Payer: Self-pay | Admitting: Endocrinology

## 2017-08-26 ENCOUNTER — Ambulatory Visit (INDEPENDENT_AMBULATORY_CARE_PROVIDER_SITE_OTHER): Payer: BLUE CROSS/BLUE SHIELD | Admitting: Endocrinology

## 2017-08-26 VITALS — BP 132/58 | HR 84 | Ht 63.0 in | Wt 151.0 lb

## 2017-08-26 DIAGNOSIS — E7800 Pure hypercholesterolemia, unspecified: Secondary | ICD-10-CM

## 2017-08-26 DIAGNOSIS — I10 Essential (primary) hypertension: Secondary | ICD-10-CM

## 2017-08-26 DIAGNOSIS — R5383 Other fatigue: Secondary | ICD-10-CM

## 2017-08-26 DIAGNOSIS — E1065 Type 1 diabetes mellitus with hyperglycemia: Secondary | ICD-10-CM

## 2017-08-26 LAB — COMPREHENSIVE METABOLIC PANEL
ALBUMIN: 4 g/dL (ref 3.5–5.2)
ALK PHOS: 88 U/L (ref 39–117)
ALT: 37 U/L — ABNORMAL HIGH (ref 0–35)
AST: 35 U/L (ref 0–37)
BUN: 17 mg/dL (ref 6–23)
CALCIUM: 9.2 mg/dL (ref 8.4–10.5)
CHLORIDE: 103 meq/L (ref 96–112)
CO2: 28 mEq/L (ref 19–32)
CREATININE: 1.03 mg/dL (ref 0.40–1.20)
GFR: 58.77 mL/min — ABNORMAL LOW (ref >60.00)
Glucose, Bld: 233 mg/dL — ABNORMAL HIGH (ref 70–99)
POTASSIUM: 4.7 meq/L (ref 3.5–5.1)
Sodium: 138 mEq/L (ref 135–145)
TOTAL PROTEIN: 6.6 g/dL (ref 6.0–8.3)
Total Bilirubin: 0.5 mg/dL (ref 0.2–1.2)

## 2017-08-26 LAB — LIPID PANEL
CHOLESTEROL: 141 mg/dL (ref 0–200)
HDL: 43.4 mg/dL (ref >39.00)
LDL CALC: 70 mg/dL (ref 0–99)
NonHDL: 97.53
TRIGLYCERIDES: 136 mg/dL (ref 0.0–149.0)
Total CHOL/HDL Ratio: 3
VLDL: 27.2 mg/dL (ref 0.0–40.0)

## 2017-08-26 LAB — POCT GLYCOSYLATED HEMOGLOBIN (HGB A1C): Hemoglobin A1C: 7.8 % — AB (ref 4.0–5.6)

## 2017-08-26 LAB — TSH: TSH: 0.83 u[IU]/mL (ref 0.35–4.50)

## 2017-08-26 LAB — MICROALBUMIN / CREATININE URINE RATIO
Creatinine,U: 131.6 mg/dL
MICROALB/CREAT RATIO: 0.5 mg/g (ref 0.0–30.0)

## 2017-08-26 NOTE — Progress Notes (Signed)
Please call to let patient know that the lab results are normal thyroid but her sugar was 233 She needs to take her NovoLog before each meal regardless of blood sugar level

## 2017-08-26 NOTE — Patient Instructions (Addendum)
Must take Novolog BEFORE each meal  4-5 Units for low low carb meal  Check blood sugars on waking up  3/7 days  Also check blood sugars about 2 hours after a meal and do this after different meals by rotation  Recommended blood sugar levels on waking up is 90-130 and about 2 hours after meal is 130-160  Please bring your blood sugar monitor to each visit, thank you

## 2017-09-28 ENCOUNTER — Other Ambulatory Visit: Payer: Self-pay | Admitting: Endocrinology

## 2017-11-11 ENCOUNTER — Encounter: Payer: Self-pay | Admitting: Podiatry

## 2017-11-11 ENCOUNTER — Ambulatory Visit: Payer: Self-pay

## 2017-11-12 NOTE — Progress Notes (Signed)
This encounter was created in error - please disregard.

## 2017-11-16 ENCOUNTER — Other Ambulatory Visit: Payer: Self-pay | Admitting: Endocrinology

## 2017-11-21 ENCOUNTER — Other Ambulatory Visit: Payer: Self-pay | Admitting: Endocrinology

## 2017-12-28 NOTE — Progress Notes (Deleted)
Patient ID: Diana Dixon, female   DOB: 04-30-1960, 57 y.o.   MRN: [id number]   Reason for Appointment: Diabetes follow-up   History of Present Illness   Diagnosis: Type 1 DIABETES MELITUS, diagnosis made in 1967      She has had long-standing type 1 diabetes with persistently poor control because of variability in blood sugars and difficulty with various self-care issues She has been reluctant to do carbohydrate counting to adjust her insulin doses and has variable readings after meals especially lunch Previously has had times when she would forget her Lantus in the evening and also mealtime dose She may have done a little better with taking Lantus twice a day but does not try to adjust the doses on her own    Recent history:  Insulin regimen: Lantus 17---28, Novolog 7 units at breakfast and 8-9 units at lunch and supper 5-12 hs  She has had persistently poor control of her diabetes despite frequently advising her on insulin changes and better compliance for day-to-day management of diet, insulin regimen and glucose monitoring  A1c today is 7.8, previously as high as 8.6 in 2018 and has been as low as 7. 4  Current blood sugar patterns and problems identified:  Because of the cost of the test strips he is only checking blood sugars in the mornings and sometimes at lunch  Her blood sugars are fluctuating markedly overnight  She thinks that blood sugar fluctuation is related to her variable diet, eating sweets or snacks late at night and sometimes higher from stress  She takes as much as 10 units of NovoLog when she is eating snacks like ice cream at bedtime  However she will usually take her insulin including mealtime doses of NOVOLOG after eating, sometimes 10 minutes later  Only when her blood sugars are significantly high when she wakes up she will take her NovoLog at a time  Today she had hypoglycemia late morning, she had taken 8 units of insulin and had no  carbohydrate at breakfast which was eggs and meat  Previously had used the freestyle libre and stopped this because of higher cost  She refusing to consider changing her Lantus, does not want to make any change in her prescriptions  She is generally drinking regular soft drinks and will not switch to diet  DIET: May have unbalanced meals like Pop tarts, waffles or breakfast cereal.  May have high fat meats at meals.   Is  some regular soft drinks and sweet tea at meals, Less consistently and less at breakfast    Glucometer: Contour Next         Blood Glucose readings from download:   Mean values apply above for all meters except median for One Touch  PRE-MEAL Fasting  midday Dinner Bedtime Overall  Glucose range:  56-365  52-291   312   Mean/median:  190  178       PREVIOUS readings  PRE-MEAL MORNING  Lunch Afternoon  6 PM +  Overall  Glucose range: 53-314  55-4 95  50-3 03  103-402    Mean/median: 139  183  157  261  186      Meals: 3 meals per day.  Eating eggs/meat for breakfast frequently without carbohydrate; dinner 5-6 pm          Physical activity: exercise:  trying to walk daily  Wt Readings from Last 3 Encounters:  08/26/17 151 lb (68.5 kg)  01/28/17 151 lb  9.6 oz (68.8 kg)  10/29/16 153 lb (69.4 kg)     Lab Results  Component Value Date   HGBA1C 7.8 (A) 08/26/2017   HGBA1C 8.0 01/28/2017   HGBA1C 8.6 10/29/2016   Lab Results  Component Value Date   MICROALBUR <0.7 08/26/2017   LDLCALC 70 08/26/2017   CREATININE 1.03 08/26/2017    Lab Results  Component Value Date   MICROALBUR <0.7 08/26/2017    Allergies as of 12/29/2017      Reactions   Penicillins    Sulfa Antibiotics       Medication List        Accurate as of 12/28/17  9:26 PM. Always use your most recent med list.          amLODipine-olmesartan 5-20 MG tablet Commonly known as:  AZOR TAKE 1 TABLET BY MOUTH ONCE DAILY   BAYER CONTOUR NEXT MONITOR w/Device Kit Use to check  blood sugar 7 times per day dx code 10.9   BAYER MICROLET LANCETS lancets Use as instructed to check blood sugar 7 times per day dx code E10.9   CONTOUR NEXT TEST test strip Generic drug:  glucose blood USE AS INSTRUCTED TO CHECK BLOOD SUGAR 7TIMES A DAY   FREESTYLE LIBRE READER Devi 1 Device by Does not apply route as directed. [id number]   FREESTYLE LIBRE SENSOR SYSTEM Misc Apply 1 sensor every 10 days   Insulin Glargine 100 UNIT/ML Solostar Pen Commonly known as:  LANTUS INJECT 17 UNITS IN THE MORNING AND 28 UNITS IN THE EVENING   NOVOLOG FLEXPEN 100 UNIT/ML FlexPen Generic drug:  insulin aspart INJECT 15 UNITS BEFORE EACH MEAL   pantoprazole 40 MG tablet Commonly known as:  PROTONIX TAKE 1 TABLET BY MOUTH ONCE A DAY   PARoxetine 20 MG tablet Commonly known as:  PAXIL Take 1 tablet (20 mg total) by mouth daily.   pravastatin 40 MG tablet Commonly known as:  PRAVACHOL TAKE 1 TABLET BY MOUTH ONCE DAILY   ULTICARE MICRO PEN NEEDLES 32G X 4 MM Misc Generic drug:  Insulin Pen Needle USE TO INJECT INSULIN AS DIRECTED 4 TIMES A DAY       Allergies:  Allergies  Allergen Reactions  . Penicillins   . Sulfa Antibiotics     Past Medical History:  Diagnosis Date  . Anxiety   . Depression   . Diabetes mellitus type I ([place])   . Diabetes mellitus without complication (HCC)    Type I  . Generalized anxiety disorder   . GERD (gastroesophageal reflux disease)   . Heart murmur   . Hypertension   . Major depressive disorder, single episode, moderate ([place])     Past Surgical History:  Procedure Laterality Date  . ABDOMINAL HYSTERECTOMY    . PARS PLANA VITRECTOMY Left 10/11/2014   Procedure: PARS PLANA VITRECTOMY WITH 25 GAUGE,CATARACT EXTRACTION WITH IOL INSERTION , ENDOLASER;  Surgeon: Milus Height, MD;  Location: [facility] ORS;  Service: Ophthalmology;  Laterality: Left;  [place] AP5  CDE CASETTE LOT # [id number] h  . SHOULDER SURGERY    . TONSILLECTOMY    .  TONSILLECTOMY      Family History  Problem Relation Age of Onset  . Thyroid disease Mother   . Breast cancer Mother 93  . Hypertension Mother   . Depression Mother   . Anxiety disorder Mother   . Breast cancer Maternal Aunt 85  . Breast cancer Cousin 19    Social History:  reports that she has never smoked.  She has never used smokeless tobacco. She reports that she does not drink alcohol or use drugs.  Review of Systems:  WEIGHT loss:  She was taking phentermine before but does not appear to have benefited with any significant weight loss  Wt Readings from Last 3 Encounters:  08/26/17 151 lb (68.5 kg)  01/28/17 151 lb 9.6 oz (68.8 kg)  10/29/16 153 lb (69.4 kg)   HYPERTENSION:  she has had long-standing hypertension treated with Azor.  Blood pressure consistently well controlled   Has history of hyperlipidemia   She is taking pravastatin 40 mg  No recent labs not available   Lab Results  Component Value Date   CHOL 141 08/26/2017   HDL 43.40 08/26/2017   LDLCALC 70 08/26/2017   LDLDIRECT 88.8 12/22/2013   TRIG 136.0 08/26/2017   CHOLHDL 3 08/26/2017     She has had mild long-standing depression controlled with Paxil and followed by psychiatrist   She is asking about taking her thyroid because a family history and fatigue at times  Eye exam 7/18     Examination:   There were no vitals taken for this visit.  There is no height or weight on file to calculate BMI.   Thyroid not palpable   ASSESSMENT/ PLAN:    Diabetes type 1 Persistently poorly controlled  See history of present illness for current problems identified, recent blood sugar patterns and her day-to-day management.   A1c is slightly better at 7.8%  As discussed above her day-to-day management is still not optimal with poor diet, not adjusting her NovoLog appropriately based on her carbohydrates, not counting carbohydrates as discussed previously and variable action of Lantus from  day-to-day She is very resistant to change Also can do much better with monitoring at different times as well as timing of her NovoLog insulin  RECOMMENDATIONS:  She will try to do her NovoLog injection before eating as long as she is aware of what she is eating  Start cutting back on sweets and excessive snacks especially late at night  Reduce NovoLog to at least 4 or 5 minutes when not eating carbohydrates in the morning and also if planning to be very active  Start rotating when she checks her blood sugars and do more readings after meals  Have given co-pay cards for insulin and test strips  Again recommended that she try [place] instead of Lantus for more consistent action but she refuses to change  May also consider doing freestyle libre at some point when she can afford this  HYPERTENSION: Her blood pressure is  well-controlled   LIPIDS: We will recheck today, needs to be on pravastatin long-term    Counseling time on subjects discussed in assessment and plan sections is over 50% of today's 25 minute visit   There are no Patient Instructions on file for this visit.     Elayne Snare 12/28/2017, 9:26 PM

## 2017-12-29 ENCOUNTER — Ambulatory Visit: Payer: Self-pay | Admitting: Endocrinology

## 2017-12-29 DIAGNOSIS — Z0289 Encounter for other administrative examinations: Secondary | ICD-10-CM

## 2018-01-12 ENCOUNTER — Other Ambulatory Visit: Payer: Self-pay | Admitting: Endocrinology

## 2018-01-28 ENCOUNTER — Other Ambulatory Visit: Payer: Self-pay

## 2018-01-28 ENCOUNTER — Encounter: Payer: Self-pay | Admitting: Psychiatry

## 2018-01-28 ENCOUNTER — Ambulatory Visit (INDEPENDENT_AMBULATORY_CARE_PROVIDER_SITE_OTHER): Payer: BLUE CROSS/BLUE SHIELD | Admitting: Psychiatry

## 2018-01-28 VITALS — BP 133/56 | HR 78 | Temp 98.8°F | Wt 152.0 lb

## 2018-01-28 DIAGNOSIS — F330 Major depressive disorder, recurrent, mild: Secondary | ICD-10-CM

## 2018-01-28 MED ORDER — PAROXETINE HCL 20 MG PO TABS: 30.0000 mg | ORAL_TABLET | Freq: Every day | ORAL | 1 refills | Status: DC

## 2018-01-28 NOTE — Progress Notes (Signed)
[facility] MD OP Progress Note  01/28/2018 5:36 PM Diana Dixon  MRN:  [id number]  Chief Complaint: ' I am here for follow up." Chief Complaint    Follow-up; Medication Refill     NUU:VOZDGU is a 57 year old Caucasian female, widowed, employed, lives in [place], has a history of depression, diabetes mellitus, hypertension, hypercholesterolemia, presented to the clinic today for a follow-up visit.  Patient today reports she is compliant on her medications.  She denies any sadness, crying spells and so on.  She however reports she has been sleeping more during the day.  It mostly happens when she is at home and has nothing to do.  She does not know if it is happening out of boredom or not.  She also has diabetes mellitus which is not currently controlled well.  It is likely that that could be another reason for her tiredness.  She denies any sleep apnea symptoms like dozing off while doing other activities or having apneic episodes or snoring while asleep and so on.  Patient continues to have good social support from her mother and her daughter.  She is currently working at [employer] and reports she works 4 days a week.  Patient denies any suicidality.  Patient denies any perceptual disturbances. Visit Diagnosis:    ICD-10-CM   1. MDD (major depressive disorder), recurrent episode, mild ([place]) F33.0     Past Psychiatric History: Reviewed past psychiatric history from my progress note on 02/02/2017  Past Medical History:  Past Medical History:  Diagnosis Date  . Anxiety   . Depression   . Diabetes mellitus type I ([place])   . Diabetes mellitus without complication (HCC)    Type I  . Generalized anxiety disorder   . GERD (gastroesophageal reflux disease)   . Heart murmur   . Hypertension   . Major depressive disorder, single episode, moderate ([place])     Past Surgical History:  Procedure Laterality Date  . ABDOMINAL HYSTERECTOMY    . PARS PLANA VITRECTOMY Left 10/11/2014   Procedure:  PARS PLANA VITRECTOMY WITH 25 GAUGE,CATARACT EXTRACTION WITH IOL INSERTION , ENDOLASER;  Surgeon: Milus Height, MD;  Location: [facility] ORS;  Service: Ophthalmology;  Laterality: Left;  [place] AP5  CDE CASETTE LOT # [id number]  . SHOULDER SURGERY    . TONSILLECTOMY    . TONSILLECTOMY      Family Psychiatric History: I have reviewed family psychiatric history from my progress note on 02/02/2017  Family History:  Family History  Problem Relation Age of Onset  . Thyroid disease Mother   . Breast cancer Mother 26  . Hypertension Mother   . Depression Mother   . Anxiety disorder Mother   . Breast cancer Maternal Aunt 20  . Breast cancer Cousin 83    Social History: Reviewed social history from my progress note on 02/02/2017 Social History   Socioeconomic History  . Marital status: Widowed    Spouse name: Not on file  . Number of children: 1  . Years of education: Not on file  . Highest education level: Some college, no degree  Occupational History    Comment: part time  Social Needs  . Financial resource strain: Somewhat hard  . Food insecurity:    Worry: Never true    Inability: Never true  . Transportation needs:    Medical: No    Non-medical: No  Tobacco Use  . Smoking status: Never Smoker  . Smokeless tobacco: Never Used  Substance and Sexual Activity  .  Alcohol use: No    Alcohol/week: 0.0 standard drinks  . Drug use: No  . Sexual activity: Not Currently    Partners: Male  Lifestyle  . Physical activity:    Days per week: 4 days    Minutes per session: 20 min  . Stress: Not at all  Relationships  . Social connections:    Talks on phone: More than three times a week    Gets together: More than three times a week    Attends religious service: More than 4 times per year    Active member of club or organization: No    Attends meetings of clubs or organizations: Never    Relationship status: Widowed  Other Topics Concern  . Not on file  Social History Narrative    Regular exercise: walk   Caffeine use: sodas    Allergies:  Allergies  Allergen Reactions  . Penicillins   . Sulfa Antibiotics     Metabolic Disorder Labs: Lab Results  Component Value Date   HGBA1C 7.8 (A) 08/26/2017   No results found for: PROLACTIN Lab Results  Component Value Date   CHOL 141 08/26/2017   TRIG 136.0 08/26/2017   HDL 43.40 08/26/2017   CHOLHDL 3 08/26/2017   VLDL 27.2 08/26/2017   LDLCALC 70 08/26/2017   LDLCALC 104 (H) 05/23/2016   Lab Results  Component Value Date   TSH 0.83 08/26/2017   TSH 0.69 12/22/2013    Therapeutic Level Labs: No results found for: LITHIUM No results found for: VALPROATE No components found for:  CBMZ  Current Medications: Current Outpatient Medications  Medication Sig Dispense Refill  . amLODipine-olmesartan (AZOR) 5-20 MG tablet TAKE 1 TABLET BY MOUTH ONCE A DAY 30 tablet 2  . BAYER MICROLET LANCETS lancets Use as instructed to check blood sugar 7 times per day dx code E10.9 200 each 3  . Blood Glucose Monitoring Suppl (BAYER CONTOUR NEXT MONITOR) w/Device KIT Use to check blood sugar 7 times per day dx code 10.9 1 kit 0  . Continuous Blood Gluc Receiver (FREESTYLE LIBRE READER) DEVI 1 Device by Does not apply route as directed. [id number]-575-99-0000-21 1 Device 1  . Continuous Blood Gluc Sensor (FREESTYLE LIBRE SENSOR SYSTEM) MISC Apply 1 sensor every 10 days 3 each 4  . CONTOUR NEXT TEST test strip USE AS INSTRUCTED TO CHECK BLOOD SUGAR 7TIMES A DAY 100 each 5  . Insulin Glargine (LANTUS SOLOSTAR) 100 UNIT/ML Solostar Pen INJECT 17 UNITS IN THE MORNING AND 28 UNITS IN THE EVENING 45 mL 2  . NOVOLOG FLEXPEN 100 UNIT/ML FlexPen INJECT 15 UNITS BEFORE EACH MEAL 30 mL 2  . pantoprazole (PROTONIX) 40 MG tablet TAKE 1 TABLET BY MOUTH ONCE A DAY 30 tablet 2  . PARoxetine (PAXIL) 20 MG tablet Take 1.5 tablets (30 mg total) by mouth daily. 135 tablet 1  . pravastatin (PRAVACHOL) 40 MG tablet TAKE 1 TABLET BY MOUTH ONCE DAILY 30  tablet 3  . ULTICARE MICRO PEN NEEDLES 32G X 4 MM MISC USE TO INJECT INSULIN AS DIRECTED 4 TIMES A DAY 200 each 2   No current facility-administered medications for this visit.      Musculoskeletal: Strength & Muscle Tone: within normal limits Gait & Station: normal Patient leans: N/A  Psychiatric Specialty Exam: Review of Systems  Psychiatric/Behavioral: Negative for depression.  All other systems reviewed and are negative.   Blood pressure (!) 133/56, pulse 78, temperature 98.8 F (37.1 C), temperature source Oral, weight 152 lb (  68.9 kg).Body mass index is 26.93 kg/m.  General Appearance: Casual  Eye Contact:  Fair  Speech:  Clear and Coherent  Volume:  Normal  Mood:  Euthymic  Affect:  Congruent  Thought Process:  Goal Directed and Descriptions of Associations: Intact  Orientation:  Full (Time, Place, and Person)  Thought Content: Logical   Suicidal Thoughts:  No  Homicidal Thoughts:  No  Memory:  Immediate;   Fair Recent;   Fair Remote;   Fair  Judgement:  Fair  Insight:  Fair  Psychomotor Activity:  Normal  Concentration:  Concentration: Fair and Attention Span: Fair  Recall:  [employer] of Knowledge: Fair  Language: Fair  Akathisia:  No  Handed:  Right  AIMS (if indicated): denies rigidity,stiffness  Assets:  Communication Skills Desire for Improvement Social Support  ADL's:  Intact  Cognition: WNL  Sleep:  excessive   Screenings:   Assessment and Plan: Terasa is a 57 year old Caucasian female who has a history of depression, presented to the clinic today for a follow-up visit.  Patient continues to have some excessive sleep during the day.  She otherwise denies any significant mood symptoms.  Discussed readjusting her medication dosage as discussed below.  Plan as noted below.  Plan Increase Paxil to 30 mg p.o. daily  Discussed leisure activities, exercise and so on.  Patient has financial problems and reports she can only follow-up once every 6  months.  Follow-up in clinic as needed if she needs to be seen prior to that.  More than 50 % of the time was spent for psychoeducation and supportive psychotherapy and care coordination.  This note was generated in part or whole with voice recognition software. Voice recognition is usually quite accurate but there are transcription errors that can and very often do occur. I apologize for any typographical errors that were not detected and corrected.       Ursula Alert, MD 01/28/2018, 5:36 PM

## 2018-02-08 ENCOUNTER — Other Ambulatory Visit: Payer: Self-pay | Admitting: Endocrinology

## 2018-02-12 ENCOUNTER — Telehealth: Payer: Self-pay | Admitting: Psychiatry

## 2018-02-12 ENCOUNTER — Telehealth: Payer: Self-pay

## 2018-02-12 MED ORDER — PAROXETINE HCL 20 MG PO TABS: 30.0000 mg | ORAL_TABLET | Freq: Every day | ORAL | 1 refills | Status: DC

## 2018-02-12 NOTE — Telephone Encounter (Signed)
Sent it

## 2018-02-12 NOTE — Telephone Encounter (Signed)
Just sent it.pls let her know

## 2018-02-12 NOTE — Telephone Encounter (Signed)
pt called states that [facility] does not have her medication.  can you please send again.    PARoxetine (PAXIL) 20 MG tablet  Medication  Date: 01/28/2018 Department: [facility] Ordering/Authorizing: Ursula Alert, MD  Order Providers   Prescribing Provider Encounter Provider  Ursula Alert, MD Ursula Alert, MD  Outpatient Medication Detail    Disp Refills Start End   PARoxetine (PAXIL) 20 MG tablet 135 tablet 1 01/28/2018    Sig - Route: Take 1.5 tablets (30 mg total) by mouth daily. - Oral   Class: Phone In

## 2018-02-16 ENCOUNTER — Encounter: Payer: Self-pay | Admitting: Podiatry

## 2018-02-16 ENCOUNTER — Ambulatory Visit: Payer: BLUE CROSS/BLUE SHIELD | Admitting: Podiatry

## 2018-02-16 DIAGNOSIS — M79671 Pain in right foot: Secondary | ICD-10-CM

## 2018-02-16 DIAGNOSIS — E108 Type 1 diabetes mellitus with unspecified complications: Secondary | ICD-10-CM

## 2018-02-16 DIAGNOSIS — L989 Disorder of the skin and subcutaneous tissue, unspecified: Secondary | ICD-10-CM

## 2018-02-18 NOTE — Progress Notes (Signed)
   Subjective: 58 year old female with a PMHx of DM presenting today with a chief complaint of a painful callus lesion to the plantar aspect of the right fifth metatarsal that appeared two months ago. Walking with shoes on increases her pain. She has tried OTC corn remover and Neosporin for treatment. Patient is here for further evaluation and treatment.   Past Medical History:  Diagnosis Date  . Anxiety   . Depression   . Diabetes mellitus type I ([place])   . Diabetes mellitus without complication (HCC)    Type I  . Generalized anxiety disorder   . GERD (gastroesophageal reflux disease)   . Heart murmur   . Hypertension   . Major depressive disorder, single episode, moderate (HCC)      Objective:  Physical Exam General: Alert and oriented x3 in no acute distress  Dermatology: Hyperkeratotic lesion present on the right sub-fifth MPJ. Pain on palpation with a central nucleated core noted.  Skin is warm, dry and supple bilateral lower extremities. Negative for open lesions or macerations.  Vascular: Palpable pedal pulses bilaterally. No edema or erythema noted. Capillary refill within normal limits.  Neurological: Epicritic and protective threshold diminished bilaterally.   Musculoskeletal Exam: Pain on palpation at the keratotic lesion noted. Range of motion within normal limits bilateral. Muscle strength 5/5 in all groups bilateral.  Assessment: #1 Diabetes mellitus w/ peripheral neuropathy #2 Porokeratotic callus lesion right sub-fifth MPJ  Plan of Care:  #1 Patient evaluated #2 Excisional debridement of keratotic lesion using a chisel blade was performed without incident.  #3 Dressed area with light dressing. #4 Continue using OTC corn and callus remover as needed.  #5 Recommended good shoe gear.  #6 Patient is to return to the clinic PRN.    Edrick Kins, DPM [facility]  Dr. Edrick Kins, [place]                                         [place], [place] [address]                Office [phone]  Fax [phone]

## 2018-03-09 ENCOUNTER — Other Ambulatory Visit: Payer: Self-pay | Admitting: Endocrinology

## 2018-05-20 ENCOUNTER — Other Ambulatory Visit: Payer: Self-pay | Admitting: Endocrinology

## 2018-05-26 ENCOUNTER — Other Ambulatory Visit: Payer: Self-pay | Admitting: Endocrinology

## 2018-06-22 ENCOUNTER — Other Ambulatory Visit: Payer: Self-pay | Admitting: Endocrinology

## 2018-07-06 ENCOUNTER — Other Ambulatory Visit: Payer: Self-pay | Admitting: Endocrinology

## 2018-07-30 ENCOUNTER — Ambulatory Visit: Payer: BLUE CROSS/BLUE SHIELD | Admitting: Psychiatry

## 2018-08-02 ENCOUNTER — Other Ambulatory Visit: Payer: Self-pay | Admitting: Endocrinology

## 2018-08-03 ENCOUNTER — Other Ambulatory Visit: Payer: Self-pay | Admitting: Endocrinology

## 2018-08-03 ENCOUNTER — Telehealth: Payer: Self-pay | Admitting: Endocrinology

## 2018-08-03 NOTE — Telephone Encounter (Signed)
MEDICATION:   Insulin Glargine (LANTUS SOLOSTAR) 100 UNIT/ML Solostar Pen    PHARMACY:   [facility] [place] (Phone) [phone] (Fax)     IS THIS A 90 DAY SUPPLY : She usually gets packs of 5  IS PATIENT OUT OF MEDICATION: No-but almost  IF NOT; HOW MUCH IS LEFT: 1 tube  LAST APPOINTMENT DATE: @6 /22/2020  NEXT APPOINTMENT DATE:@Visit  date not found  DO WE HAVE YOUR PERMISSION TO LEAVE A DETAILED MESSAGE:Yes  OTHER COMMENTS:    **Let patient know to contact pharmacy at the end of the day to make sure medication is ready. **  ** Please notify patient to allow 48-72 hours to process**  **Encourage patient to contact the pharmacy for refills or they can request refills through [facility]**

## 2018-08-03 NOTE — Telephone Encounter (Signed)
Called patient-scheduled appt with same day labs-per pt -for 11/02/18. Pt is unable to come in any sooner do to new job and insurance.

## 2018-08-03 NOTE — Telephone Encounter (Signed)
Could you please call and schedule pt for an appointment. She cannot receive refills until seen for labs and office visit.

## 2018-08-12 ENCOUNTER — Other Ambulatory Visit: Payer: Self-pay | Admitting: Endocrinology

## 2018-09-04 ENCOUNTER — Other Ambulatory Visit: Payer: Self-pay | Admitting: Endocrinology

## 2018-11-02 ENCOUNTER — Ambulatory Visit: Payer: BLUE CROSS/BLUE SHIELD | Admitting: Endocrinology

## 2019-01-05 ENCOUNTER — Other Ambulatory Visit: Payer: Self-pay | Admitting: Physician Assistant

## 2019-01-05 DIAGNOSIS — Z1231 Encounter for screening mammogram for malignant neoplasm of breast: Secondary | ICD-10-CM

## 2019-03-03 ENCOUNTER — Ambulatory Visit
Admission: RE | Admit: 2019-03-03 | Discharge: 2019-03-03 | Disposition: A | Discharge status: Home / Self Care | Payer: 59 | Source: Ambulatory Visit | Attending: Physician Assistant | Admitting: Physician Assistant

## 2019-03-03 DIAGNOSIS — Z1231 Encounter for screening mammogram for malignant neoplasm of breast: Secondary | ICD-10-CM | POA: Diagnosis not present

## 2019-03-25 ENCOUNTER — Telehealth: Payer: Self-pay

## 2019-03-25 NOTE — Telephone Encounter (Signed)
Pharmacy called and needs new RX for One Touch Ultra Meter, One Touch Ultra test strips, and [place]aca lancets sent to them, Patient is testing BID .  Patient has new Insurance and that is what they will cover.     Christ at  [employer] [id number]

## 2019-03-25 NOTE — Telephone Encounter (Signed)
Pt must first be schedule for office visit. Has not been seen since 2019.

## 2019-03-28 ENCOUNTER — Other Ambulatory Visit: Payer: Self-pay

## 2019-03-28 MED ORDER — ONETOUCH ULTRA 2 W/DEVICE KIT: PACK | 0 refills | Status: DC

## 2019-03-28 MED ORDER — ONETOUCH DELICA LANCETS 30G MISC: 1.0000 | Freq: Two times a day (BID) | 2 refills | Status: DC

## 2019-03-28 MED ORDER — ONETOUCH ULTRA VI STRP: ORAL_STRIP | 2 refills | Status: DC

## 2019-03-28 NOTE — Telephone Encounter (Signed)
Patient is scheduled for appointment on 05/09/19 at 9:00 a.m. and requests the RX's listed below be sent to [facility] listed below.

## 2019-03-28 NOTE — Telephone Encounter (Signed)
Rx sent 

## 2019-04-18 ENCOUNTER — Other Ambulatory Visit: Payer: Self-pay | Admitting: Endocrinology

## 2019-05-09 ENCOUNTER — Other Ambulatory Visit: Payer: Self-pay

## 2019-05-09 ENCOUNTER — Telehealth: Payer: Self-pay | Admitting: Endocrinology

## 2019-05-09 ENCOUNTER — Encounter: Payer: Self-pay | Admitting: Endocrinology

## 2019-05-09 ENCOUNTER — Ambulatory Visit (INDEPENDENT_AMBULATORY_CARE_PROVIDER_SITE_OTHER): Payer: 59 | Admitting: Endocrinology

## 2019-05-09 VITALS — BP 132/40 | HR 77 | Ht 63.0 in | Wt 154.0 lb

## 2019-05-09 DIAGNOSIS — E7800 Pure hypercholesterolemia, unspecified: Secondary | ICD-10-CM | POA: Diagnosis not present

## 2019-05-09 DIAGNOSIS — E1065 Type 1 diabetes mellitus with hyperglycemia: Secondary | ICD-10-CM | POA: Diagnosis not present

## 2019-05-09 DIAGNOSIS — I10 Essential (primary) hypertension: Secondary | ICD-10-CM | POA: Diagnosis not present

## 2019-05-09 LAB — LIPID PANEL
Cholesterol: 198 mg/dL (ref 0–200)
HDL: 46.5 mg/dL (ref >39.00)
LDL Cholesterol: 133 mg/dL — ABNORMAL HIGH (ref 0–99)
NonHDL: 151.78
Total CHOL/HDL Ratio: 4
Triglycerides: 92 mg/dL (ref 0.0–149.0)
VLDL: 18.4 mg/dL (ref 0.0–40.0)

## 2019-05-09 LAB — COMPREHENSIVE METABOLIC PANEL
ALT: 46 U/L — ABNORMAL HIGH (ref 0–35)
AST: 49 U/L — ABNORMAL HIGH (ref 0–37)
Albumin: 4.3 g/dL (ref 3.5–5.2)
Alkaline Phosphatase: 120 U/L — ABNORMAL HIGH (ref 39–117)
BUN: 18 mg/dL (ref 6–23)
CO2: 26 mEq/L (ref 19–32)
Calcium: 9.3 mg/dL (ref 8.4–10.5)
Chloride: 103 mEq/L (ref 96–112)
Creatinine, Ser: 0.96 mg/dL (ref 0.40–1.20)
GFR: 59.62 mL/min — ABNORMAL LOW (ref >60.00)
Glucose, Bld: 174 mg/dL — ABNORMAL HIGH (ref 70–99)
Potassium: 4.4 mEq/L (ref 3.5–5.1)
Sodium: 136 mEq/L (ref 135–145)
Total Bilirubin: 0.4 mg/dL (ref 0.2–1.2)
Total Protein: 6.7 g/dL (ref 6.0–8.3)

## 2019-05-09 LAB — MICROALBUMIN / CREATININE URINE RATIO
Creatinine,U: 79.9 mg/dL
Microalb Creat Ratio: 1.3 mg/g (ref 0.0–30.0)
Microalb, Ur: 1.1 mg/dL (ref 0.0–1.9)

## 2019-05-09 LAB — POCT GLYCOSYLATED HEMOGLOBIN (HGB A1C): Hemoglobin A1C: 7.6 % — AB (ref 4.0–5.6)

## 2019-05-09 MED ORDER — FREESTYLE LIBRE 14 DAY SENSOR MISC: 3 refills | Status: DC

## 2019-05-09 MED ORDER — ONETOUCH ULTRA VI STRP: ORAL_STRIP | 2 refills | Status: DC

## 2019-05-09 NOTE — Telephone Encounter (Signed)
Rx sent 

## 2019-05-09 NOTE — Telephone Encounter (Signed)
MEDICATION: Sensor for Free Style Libre  PHARMACY:  [place]  IS THIS A 90 DAY SUPPLY :   IS PATIENT OUT OF MEDICATION: none  IF NOT; HOW MUCH IS LEFT:   LAST APPOINTMENT DATE: @3 /29/2021  NEXT APPOINTMENT DATE:@7 /30/2021  DO WE HAVE YOUR PERMISSION TO LEAVE A DETAILED MESSAGE: yes  OTHER COMMENTS:    **Let patient know to contact pharmacy at the end of the day to make sure medication is ready. **  ** Please notify patient to allow 48-72 hours to process**  **Encourage patient to contact the pharmacy for refills or they can request refills through [facility]**

## 2019-05-09 NOTE — Progress Notes (Signed)
Patient ID: Diana Dixon, female   DOB: 09-27-1960, 59 y.o.   MRN: [id number]   Reason for Appointment:  follow-up   History of Present Illness   Diagnosis: Type 1 DIABETES MELITUS, diagnosis made in 1967      She has had long-standing type 1 diabetes with persistently poor control because of variability in blood sugars and difficulty with various self-care issues She has been reluctant to do carbohydrate counting to adjust her insulin doses and has variable readings after meals especially lunch Previously has had times when she would forget her Lantus in the evening and also mealtime dose She may have done a little better with taking Lantus twice a day but does not try to adjust the doses on her own    Recent history:  Insulin regimen: Lantus 18---28, Novolog 8-10 units at breakfast and 10 units at lunch and supper  She has had persistently poor control of her diabetes despite frequently advising her on insulin changes and better compliance for day-to-day management of diet, insulin regimen and glucose monitoring  A1c today is 7.6, previously as high as 8.6 in 2018 and has been as low as 7. 4  Current blood sugar patterns and problems identified:  Because of her insurance last year she did not come back follow-up  She says she has had difficulty with getting enough test strips and prescriptions and has only checked blood sugars in the morning lately  Because of the cost of the test strips he is only checking blood sugars in the mornings and sometimes at lunch  Her blood sugars are more stable overnight, previously had marked fluctuation  Also the last week or so her morning sugars have been close to normal especially the last 3 days  Occasionally may wake up with a low sugar although not clear if she has symptoms since she says that sometimes her dog will wake her up and her sugar will be low  She is arbitrarily adjusting her mealtime insulin based on blood sugar  level if available but not able to count carbohydrates  Not clear what her postprandial readings are usually  Highest reading was 408 when she forgot her lunchtime insulin  She is not drinking as much sweet drinks, usually 1/2 coke at lunch and 1 cup sweet tea at dinnertime  She thinks that she is trying to snack less at night   DIET: May have unbalanced meals like Pop tarts, waffles or breakfast cereal.  May have high fat meats at meals.   Is drinking regular soft drinks and sweet tea at meals, less at breakfast    Glucometer: One Touch ultra 2        Blood Glucose readings from download:    PRE-MEAL Fasting Lunch Dinner Bedtime Overall  Glucose range:  62-273  141, 111     Mean/median:  135    156   POST-MEAL PC Breakfast PC Lunch PC Dinner  Glucose range:    254  Mean/median:      Previous readings:  PRE-MEAL Fasting  midday Dinner Bedtime Overall  Glucose range:  56-365  52-291   312   Mean/median:  190  178         Meals: 3 meals per day.  Eating eggs/meat for breakfast frequently without carbohydrate; dinner 5-6 pm          Physical activity: exercise:  walk at lunch  Wt Readings from Last 3 Encounters:  05/09/19 154 lb (69.9 kg)  08/26/17 151 lb (68.5 kg)  01/28/17 151 lb 9.6 oz (68.8 kg)     Lab Results  Component Value Date   HGBA1C 7.6 (A) 05/09/2019   HGBA1C 7.8 (A) 08/26/2017   HGBA1C 8.0 01/28/2017   Lab Results  Component Value Date   MICROALBUR <0.7 08/26/2017   LDLCALC 70 08/26/2017   CREATININE 1.03 08/26/2017    Lab Results  Component Value Date   MICROALBUR <0.7 08/26/2017    Allergies as of 05/09/2019      Reactions   Penicillins    Sulfa Antibiotics       Medication List       Accurate as of May 09, 2019  9:40 AM. If you have any questions, ask your nurse or doctor.        amLODipine-olmesartan 5-20 MG tablet Commonly known as: AZOR TAKE 1 TABLET BY MOUTH ONCE DAILY   BD Pen Needle Nano U/F 32G X 4 MM Misc Generic  drug: Insulin Pen Needle USE TO INJECT INSULIN AS DIRECTED 4 TIMES A DAY   Lantus SoloStar 100 UNIT/ML Solostar Pen Generic drug: insulin glargine Inject into the skin 2 (two) times daily. Inject 18 units under the skin in the morning and 28 units in the evening. What changed: Another medication with the same name was removed. Continue taking this medication, and follow the directions you see here. Changed by: Elayne Snare, MD   NovoLOG FlexPen 100 UNIT/ML FlexPen Generic drug: insulin aspart Inject 10 Units into the skin 3 (three) times daily with meals. Inject 10 units under the skin three times daily. What changed: Another medication with the same name was removed. Continue taking this medication, and follow the directions you see here. Changed by: Elayne Snare, MD   ONE TOUCH ULTRA 2 w/Device Kit Use Onetouch Ultra to check blood sugar 2 times daily.   OneTouch Delica Lancets 75T Misc 1 each by Does not apply route in the morning and at bedtime. Use onetouch delica lancets to check blood sugar twice daily.   OneTouch Ultra test strip Generic drug: glucose blood Use Onetouch ultra test strips as instructed to check blood sugar 4 times daily. What changed: additional instructions Changed by: Jayme Cloud, LPN   pantoprazole 40 MG tablet Commonly known as: PROTONIX TAKE 1 TABLET BY MOUTH ONCE A DAY   PARoxetine 20 MG tablet Commonly known as: Paxil Take 1.5 tablets (30 mg total) by mouth daily.   pravastatin 40 MG tablet Commonly known as: PRAVACHOL TAKE 1 TABLET BY MOUTH ONCE A DAY       Allergies:  Allergies  Allergen Reactions  . Penicillins   . Sulfa Antibiotics     Past Medical History:  Diagnosis Date  . Anxiety   . Depression   . Diabetes mellitus type I ([place])   . Diabetes mellitus without complication (HCC)    Type I  . Generalized anxiety disorder   . GERD (gastroesophageal reflux disease)   . Heart murmur   . Hypertension   . Major depressive  disorder, single episode, moderate ([place])     Past Surgical History:  Procedure Laterality Date  . ABDOMINAL HYSTERECTOMY    . PARS PLANA VITRECTOMY Left 10/11/2014   Procedure: PARS PLANA VITRECTOMY WITH 25 GAUGE,CATARACT EXTRACTION WITH IOL INSERTION , ENDOLASER;  Surgeon: Milus Height, MD;  Location: [facility] ORS;  Service: Ophthalmology;  Laterality: Left;  [place] AP5  CDE CASETTE LOT # [id number] h  . SHOULDER SURGERY    . TONSILLECTOMY    .  TONSILLECTOMY      Family History  Problem Relation Age of Onset  . Thyroid disease Mother   . Breast cancer Mother 15  . Hypertension Mother   . Depression Mother   . Anxiety disorder Mother   . Breast cancer Maternal Aunt 80  . Breast cancer Cousin 74    Social History:  reports that she has never smoked. She has never used smokeless tobacco. She reports that she does not drink alcohol or use drugs.  Review of Systems:  HYPERTENSION:  she has had long-standing hypertension treated with Azor.  She thinks she is getting the Benicar and amlodipine separately now because of insurance preference Blood pressure consistently well controlled   BP Readings from Last 3 Encounters:  05/09/19 (!) 132/40  08/26/17 (!) 132/58  01/28/17 130/66     Has history of hyperlipidemia   She is taking pravastatin 40 mg as before  No recent labs not available   Lab Results  Component Value Date   CHOL 141 08/26/2017   HDL 43.40 08/26/2017   LDLCALC 70 08/26/2017   LDLDIRECT 88.8 12/22/2013   TRIG 136.0 08/26/2017   CHOLHDL 3 08/26/2017     She has had mild long-standing depression controlled with Paxil and followed by psychiatrist   Eye exam 7/18 and has not made a follow-up appointment yet     Examination:   BP (!) 132/40 (BP Location: Left Arm, Patient Position: Sitting, Cuff Size: Normal)   Pulse 77   Ht _0  (1.6 m)   Wt 154 lb (69.9 kg)   SpO2 97%   BMI 27.28 kg/m   Body mass index is 27.28 kg/m.   Diabetic Foot Exam - Simple     Simple Foot Form Diabetic Foot exam was performed with the following findings: Yes 05/09/2019  9:39 AM  Visual Inspection No deformities, no ulcerations, no other skin breakdown bilaterally: Yes Sensation Testing Intact to touch and monofilament testing bilaterally: Yes Pulse Check Posterior Tibialis and Dorsalis pulse intact bilaterally: Yes Comments       ASSESSMENT/ PLAN:   Diabetes type 1 poorly controlled  See history of present illness for current problems identified, recent blood sugar patterns and her day-to-day management.   A1c is slightly better at 7.6%  She is likely having better A1c with somewhat better diet and reducing her regular soft drinks and snacks overall Fasting readings are very stable the last few days and as low as 62 today Not clear if she has some nocturnal hypoglycemia unawareness Has minimal glucose monitoring during the day and difficult to know what her postprandial patterns are and how she is needing to adjust her mealtime dose based on daytime blood sugar patterns She has gained 3 pounds since her last visit  RECOMMENDATIONS:  She will check blood sugars 4 times a day  Adjust mealtime dose based on what she is planning to eat and also make sure her postprandial readings are not consistently over 180  Reduce Lantus to 26 in the evenings  May also be considered doing freestyle libre if she can check the out-of-pocket expense for this  Given her information on a free starter with freestyle libre to get online, discussed options for using the freestyle version 2 for getting alerts on high and low readings  Continue reducing snacks and regular soft drinks  HYPERTENSION: Her blood pressure is  well-controlled Need to verify from her pharmacy which medication she is getting and what doses she had been given by  her PCP  LIPIDS: We will recheck her lipid panel today  Reminded her to schedule eye exam  Will need follow-up in July, reminded her  to keep regular follow-up appointment  Discussed benefits and importance of taking the Covid vaccine and reassured her that this is safe and needs to go ahead and schedule her appointment   Patient Instructions  Lantus 26 at nite  Check blood sugars on waking up 5-6 days a week  Also check blood sugars about 2 hours after meals and do this after different meals by rotation  Recommended blood sugar levels on waking up are 80-130 and about 2 hours after meal is 130-180  Please bring your blood sugar monitor to each visit, thank you  Call re: Elenor Legato sensor  Please get eye exam      Elayne Snare 05/09/2019, 9:40 AM

## 2019-05-09 NOTE — Patient Instructions (Addendum)
Lantus 26 at nite  Check blood sugars on waking up 5-6 days a week  Also check blood sugars about 2 hours after meals and do this after different meals by rotation  Recommended blood sugar levels on waking up are 80-130 and about 2 hours after meal is 130-180  Please bring your blood sugar monitor to each visit, thank you  Call re: Elenor Legato sensor  Please get eye exam

## 2019-05-20 ENCOUNTER — Ambulatory Visit: Payer: 59 | Admitting: Nurse Practitioner

## 2019-05-20 ENCOUNTER — Telehealth: Payer: Self-pay | Admitting: Nurse Practitioner

## 2019-05-20 ENCOUNTER — Encounter: Payer: Self-pay | Admitting: Nurse Practitioner

## 2019-05-20 ENCOUNTER — Other Ambulatory Visit: Payer: Self-pay

## 2019-05-20 VITALS — BP 152/60 | HR 87 | Temp 96.7°F | Resp 14 | Ht 63.0 in | Wt 154.8 lb

## 2019-05-20 DIAGNOSIS — I10 Essential (primary) hypertension: Secondary | ICD-10-CM | POA: Diagnosis not present

## 2019-05-20 DIAGNOSIS — R7989 Other specified abnormal findings of blood chemistry: Secondary | ICD-10-CM

## 2019-05-20 DIAGNOSIS — E7800 Pure hypercholesterolemia, unspecified: Secondary | ICD-10-CM

## 2019-05-20 DIAGNOSIS — J301 Allergic rhinitis due to pollen: Secondary | ICD-10-CM | POA: Diagnosis not present

## 2019-05-20 DIAGNOSIS — F4329 Adjustment disorder with other symptoms: Secondary | ICD-10-CM

## 2019-05-20 DIAGNOSIS — J309 Allergic rhinitis, unspecified: Secondary | ICD-10-CM

## 2019-05-20 DIAGNOSIS — Z8619 Personal history of other infectious and parasitic diseases: Secondary | ICD-10-CM | POA: Diagnosis not present

## 2019-05-20 DIAGNOSIS — F419 Anxiety disorder, unspecified: Secondary | ICD-10-CM

## 2019-05-20 DIAGNOSIS — F329 Major depressive disorder, single episode, unspecified: Secondary | ICD-10-CM

## 2019-05-20 DIAGNOSIS — F32A Depression, unspecified: Secondary | ICD-10-CM

## 2019-05-20 HISTORY — DX: Allergic rhinitis, unspecified: J30.9

## 2019-05-20 LAB — HEPATIC FUNCTION PANEL
ALT: 45 U/L — ABNORMAL HIGH (ref 0–35)
AST: 41 U/L — ABNORMAL HIGH (ref 0–37)
Albumin: 4.3 g/dL (ref 3.5–5.2)
Alkaline Phosphatase: 123 U/L — ABNORMAL HIGH (ref 39–117)
Bilirubin, Direct: 0.1 mg/dL (ref 0.0–0.3)
Total Bilirubin: 0.5 mg/dL (ref 0.2–1.2)
Total Protein: 6.8 g/dL (ref 6.0–8.3)

## 2019-05-20 LAB — CBC WITH DIFFERENTIAL/PLATELET
Basophils Absolute: 0.1 10*3/uL (ref 0.0–0.1)
Basophils Relative: 1 % (ref 0.0–3.0)
Eosinophils Absolute: 0.1 10*3/uL (ref 0.0–0.7)
Eosinophils Relative: 1.2 % (ref 0.0–5.0)
HCT: 36.9 % (ref 36.0–46.0)
Hemoglobin: 12.1 g/dL (ref 12.0–15.0)
Lymphocytes Relative: 18.9 % (ref 12.0–46.0)
Lymphs Abs: 1.1 10*3/uL (ref 0.7–4.0)
MCHC: 32.8 g/dL (ref 30.0–36.0)
MCV: 80 fl (ref 78.0–100.0)
Monocytes Absolute: 0.3 10*3/uL (ref 0.1–1.0)
Monocytes Relative: 5.9 % (ref 3.0–12.0)
Neutro Abs: 4.3 10*3/uL (ref 1.4–7.7)
Neutrophils Relative %: 73 % (ref 43.0–77.0)
Platelets: 235 10*3/uL (ref 150.0–400.0)
RBC: 4.62 Mil/uL (ref 3.87–5.11)
RDW: 14.3 % (ref 11.5–15.5)
WBC: 5.9 10*3/uL (ref 4.0–10.5)

## 2019-05-20 MED ORDER — PAROXETINE HCL 20 MG PO TABS: 30.0000 mg | ORAL_TABLET | Freq: Every day | ORAL | 1 refills | Status: DC

## 2019-05-20 MED ORDER — AMLODIPINE BESYLATE 5 MG PO TABS: 5.0000 mg | ORAL_TABLET | Freq: Every day | ORAL | 3 refills | Status: DC

## 2019-05-20 MED ORDER — CETIRIZINE HCL 10 MG PO TABS: 10.0000 mg | ORAL_TABLET | Freq: Every day | ORAL | 3 refills | Status: DC

## 2019-05-20 MED ORDER — OLMESARTAN MEDOXOMIL 20 MG PO TABS: 20.0000 mg | ORAL_TABLET | Freq: Every day | ORAL | 3 refills | Status: DC

## 2019-05-20 NOTE — Progress Notes (Signed)
New Patient Office Visit  Subjective:  Patient ID: Diana Dixon, female    DOB: 02/20/1960  Age: 59 y.o. MRN: [id number]  CC:  Chief Complaint  Patient presents with  . Establish Care    New patient    HPI Diana Dixon is a 59 yo female with type 1 diabetes mellitus without complication, hypercholesterolemia, GERD without esophagitis,  hypertension, depression, anxiety, history of abnormal Pap smear, candidiasis of the breast presents  to establish care with a new primary care provider in the [facility] location.   She had her most recent annual physical exam on 11/11/2018. Her concerns today include:   1. Elevated LFTs. Routine blood work from her endocrinologist revealed: 05/09/19:  alkaline phosphatase 120, AST 49, ALT 46, total bilirubin 0.4.   2020: ALT 37, AST 35, alkaline phosphatase 88.  2019: ALT 27, AST 24, alkaline phosphatase 95.  Patient tells me she has a history of hx of HCV treatment in the very distant past (can't remember when) and she is worried that she has HCV again. It sounds like she may have had the first generation of HCV treatment by her description.  She thinks she acquired HCV from her ex-husband. She has had no new exposure risks that she is aware.   She denies any right upper quadrant pain, tenderness, nausea or vomiting.  She denies any epigastric pain or back pain.  She denies jaundice, pruritus, unusual fatigue or weakness, abdominal ascites, or edema.  She denies any alcohol, illicit drug use, and does take occasional ibuprofen.  She denies any over-the-counter medications, herbal medications.  She denies regular Tylenol use.  She has never been diagnosed with  fatty liver.  2. Environmental allergies and would like refills of Zyrtec 10 mg daily.If misses one dose she gets runny nose. Allergy testing done previously. Well controlled on Zyrtec.   3. Depression/Anxiety: She  would like refills of her Paxil 20 mg daily. She is doing well on the  medication and believes her anxiety and depression is controlled. She  has she has not seen her psychiatrist in a while.  Denies alcohol use or illicit drug use. GAD7-0, PHQ-9: 0  HLD: Pravachol 40 mg daily, restarted as  she went off of it for 1 year and decided to lower her lipids by eating cheerios instead.   Lab Results  Component Value Date   CHOL 198 05/09/2019   CHOL 141 08/26/2017   CHOL 168 05/23/2016   Lab Results  Component Value Date   HDL 46.50 05/09/2019   HDL 43.40 08/26/2017   HDL 48.80 05/23/2016   Lab Results  Component Value Date   LDLCALC 133 (H) 05/09/2019   LDLCALC 70 08/26/2017   LDLCALC 104 (H) 05/23/2016   Lab Results  Component Value Date   TRIG 92.0 05/09/2019   TRIG 136.0 08/26/2017   TRIG 74.0 05/23/2016   Lab Results  Component Value Date   CHOLHDL 4 05/09/2019   CHOLHDL 3 08/26/2017   CHOLHDL 3 05/23/2016   Lab Results  Component Value Date   LDLDIRECT 88.8 12/22/2013   LDLDIRECT 130.7 12/22/2012    DMType I: FBS checks continuous 166, lunchtime-200, supper -300 Novolog 10 u am, 10 u lunch , 10 u supper Lantus 18 am and 26-28 at HS cut back but she is back on 28 because no lows.  Diabetes care by Dr. Dwyane Dee in [place] and last saw him 05/09/19 Lab Results  Component Value Date   HGBA1C 7.6 (A)  05/09/2019   GERD: Sx are tries to burp and can't. She takes Protonix -x1-2 a week and is well controlled.   HTN: She is taking amlodipine 5 mg and olmesartin 20 mg separately as nsurance would not cover AZOR.  She reports doing well on this blood pressure combination and not skipping doses.  No chest pain, shortness of breath, DOE, or edema.  She has noted no lightheadedness, dizziness, unusual fatigue, or weakness. She has a BP cuff at home and is willing to check her BP.   BP Readings from Last 3 Encounters:  05/20/19 (!) 152/60  05/09/19 (!) 132/40  08/26/17 (!) 132/58   Health Maintenance: Immunizations:  Covid vaccine x 1.  Moderna 05/12/19 Flu Vaccine 73yror older (AFLURIA syr) Inactivated Quadrivalent 11/23/2013, 11/27/2014  . Flu Vaccine 345yrr older (FLUZONE vial) Inactivated Quadrivalent 11/19/2016, 11/16/2017  . Tdap (ADACEL, BOOSTRIX) 10/13/2012    Diet: Regular Exercise: Walks  Colonoscopy: 2013 Dexa: none listed Pap Smear:UTD Mammogram: UTD   Past Medical History:  Diagnosis Date  . Allergic rhinitis 05/20/2019  . Anxiety   . Depression   . Diabetes mellitus type I ([facility]  . Diabetes mellitus without complication (HCC)    Type I  . Generalized anxiety disorder   . GERD (gastroesophageal reflux disease)   . Heart murmur   . History of hepatitis C virus infection 20 years ago   She can't recall but did get treated for HCV and it was resolved.   . Hypertension   . Major depressive disorder, single episode, moderate ([facility]    Past Surgical History:  Procedure Laterality Date  . ABDOMINAL HYSTERECTOMY    . PARS PLANA VITRECTOMY Left 10/11/2014   Procedure: PARS PLANA VITRECTOMY WITH 25 GAUGE,CATARACT EXTRACTION WITH IOL INSERTION , ENDOLASER;  Surgeon: JeMilus HeightMD;  Location: [facility] ORS;  Service: Ophthalmology;  Laterality: Left;  [id number]  CDE CASETTE LOT # [id number]  . SHOULDER SURGERY    . TONSILLECTOMY    . TONSILLECTOMY      Family History  Problem Relation Age of Onset  . Thyroid disease Mother   . Breast cancer Mother 3810. Hypertension Mother   . Depression Mother   . Anxiety disorder Mother   . Breast cancer Maternal Aunt [id number]. Breast cancer Cousin 5521  Social History   Socioeconomic History  . Marital status: Single    Spouse name: Not on file  . Number of children: 1  . Years of education: Not on file  . Highest education level: Some college, no degree  Occupational History    Comment: part time  Tobacco Use  . Smoking status: Never Smoker  . Smokeless tobacco: Never Used  Substance and Sexual Activity  . Alcohol use: No    Alcohol/week: 0.0 standard  drinks  . Drug use: No  . Sexual activity: Not Currently    Partners: Male  Other Topics Concern  . Not on file  Social History Narrative   Regular exercise: walk   Caffeine use: sodas   Social Determinants of Health   Financial Resource Strain:   . Difficulty of Paying Living Expenses:   Food Insecurity:   . Worried About RuCharity fundraisern the Last Year:   . RaArboriculturistn the Last Year:   Transportation Needs:   . LaFilm/video editorMedical):   . Marland Kitchenack of Transportation (Non-Medical):   Physical Activity:   . Days of  Exercise per Week:   . Minutes of Exercise per Session:   Stress:   . Feeling of Stress :   Social Connections:   . Frequency of Communication with Friends and Family:   . Frequency of Social Gatherings with Friends and Family:   . Attends Religious Services:   . Active Member of Clubs or Organizations:   . Attends Archivist Meetings:   Marland Kitchen Marital Status:   Intimate Partner Violence:   . Fear of Current or Ex-Partner:   . Emotionally Abused:   Marland Kitchen Physically Abused:   . Sexually Abused:     ROS Review of Systems  Constitutional: Negative for chills, fatigue, fever and unexpected weight change.  HENT: Negative for congestion and sore throat.   Eyes: Negative for visual disturbance.  Respiratory: Negative for cough and shortness of breath.   Cardiovascular: Negative for chest pain, palpitations and leg swelling.  Gastrointestinal: Negative for abdominal pain, constipation and diarrhea.  Endocrine: Negative for cold intolerance and heat intolerance.  Genitourinary: Negative for difficulty urinating, dyspareunia and urgency.  Musculoskeletal: Positive for back pain. Negative for arthralgias.       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 A few doses of acetominpophen.  Skin: Negative for rash.       Irritation under breast- uses hydrocortisone and is resolved  Allergic/Immunologic: Positive  for environmental allergies.       Controlled on Zyrtec  Neurological: Negative for dizziness, weakness and headaches.  Hematological: Negative for adenopathy. Does not bruise/bleed easily.  Psychiatric/Behavioral:       She is on Paxil and it is doing well. No concerns about depression/anxiety. No SI/HI.     Objective:   Today's Vitals: BP (!) 152/60 (BP Location: Left Arm, Patient Position: Sitting, Cuff Size: Normal)   Pulse 87   Temp (!) 96.7 F (35.9 C) (Temporal)   Resp 14   Ht 5' 3"  (1.6 m)   Wt 154 lb 12.8 oz (70.2 kg)   SpO2 98%   BMI 27.42 kg/m   Physical Exam Constitutional:      Appearance: Normal appearance.  HENT:     Head: Normocephalic.  Eyes:     Pupils: Pupils are equal, round, and reactive to light.  Cardiovascular:     Rate and Rhythm: Normal rate and regular rhythm.     Heart sounds: Normal heart sounds.  Pulmonary:     Effort: Pulmonary effort is normal.     Breath sounds: Normal breath sounds.  Abdominal:     Palpations: Abdomen is soft.     Tenderness: There is no abdominal tenderness.  Musculoskeletal:        General: Normal range of motion.  Skin:    General: Skin is warm and dry.     Findings: No rash.  Neurological:     General: No focal deficit present.     Mental Status: She is alert and oriented to person, place, and time.  Psychiatric:        Mood and Affect: Mood normal.        Behavior: Behavior normal.        Thought Content: Thought content normal.        Judgment: Judgment normal.     Assessment & Plan:   Problem List Items Addressed This Visit      Cardiovascular and Mediastinum   Essential hypertension   Relevant Medications   amLODipine (NORVASC) 5 MG tablet  olmesartan (BENICAR) 20 MG tablet     Respiratory   Allergic rhinitis     Other   Anxiety   Relevant Medications   PARoxetine (PAXIL) 20 MG tablet   Depressive disorder   Relevant Medications   PARoxetine (PAXIL) 20 MG tablet   Hypercholesterolemia     Relevant Medications   amLODipine (NORVASC) 5 MG tablet   olmesartan (BENICAR) 20 MG tablet    Other Visit Diagnoses    Elevated LFTs    -  Primary   Relevant Orders   Hepatic function panel (Completed)   Hepatitis A Ab, Total   Hepatitis B core antibody, total   Hepatitis B surface antibody,qualitative   Hepatitis B Surface AntiGEN   Hepatitis B Core Antibody, IgM   HCV RNA BY PCR, QN RFX GENO   US Abdomen Complete   CBC with Differential/Platelet (Completed)   History of hepatitis C virus infection       Relevant Orders   HCV RNA BY PCR, QN RFX GENO   US Abdomen Complete      Outpatient Encounter Medications as of 05/20/2019  Medication Sig  . BD PEN NEEDLE NANO U/F 32G X 4 MM MISC USE TO INJECT INSULIN AS DIRECTED 4 TIMES A DAY  . Blood Glucose Monitoring Suppl (ONE TOUCH ULTRA 2) w/Device KIT Use Onetouch Ultra to check blood sugar 2 times daily.  . Continuous Blood Gluc Sensor (FREESTYLE LIBRE 14 DAY SENSOR) MISC Use one sensor every 14 days to monitor blood sugars.  Marland Kitchen glucose blood (ONETOUCH ULTRA) test strip Use Onetouch ultra test strips as instructed to check blood sugar 4 times daily.  . hydrocortisone 2.5 % cream   . insulin aspart (NOVOLOG FLEXPEN) 100 UNIT/ML FlexPen Inject 10 Units into the skin 3 (three) times daily with meals. Inject 10 units under the skin three times daily.  . insulin glargine (LANTUS SOLOSTAR) 100 UNIT/ML Solostar Pen Inject into the skin 2 (two) times daily. Inject 18 units under the skin in the morning and 28 units in the evening.  Glory Rosebush Delica Lancets 25D MISC 1 each by Does not apply route in the morning and at bedtime. Use onetouch delica lancets to check blood sugar twice daily.  . pantoprazole (PROTONIX) 40 MG tablet TAKE 1 TABLET BY MOUTH ONCE A DAY  . PARoxetine (PAXIL) 20 MG tablet Take 1.5 tablets (30 mg total) by mouth daily.  . pravastatin (PRAVACHOL) 40 MG tablet TAKE 1 TABLET BY MOUTH ONCE A DAY  . [DISCONTINUED]  amLODipine-olmesartan (AZOR) 5-20 MG tablet TAKE 1 TABLET BY MOUTH ONCE DAILY  . [DISCONTINUED] PARoxetine (PAXIL) 20 MG tablet Take 1.5 tablets (30 mg total) by mouth daily.  Marland Kitchen amLODipine (NORVASC) 5 MG tablet Take 1 tablet (5 mg total) by mouth daily.  . cetirizine (ZYRTEC) 10 MG tablet Take 1 tablet (10 mg total) by mouth daily.  Marland Kitchen olmesartan (BENICAR) 20 MG tablet Take 1 tablet (20 mg total) by mouth daily.   No facility-administered encounter medications on file as of 05/20/2019.   Please go to the lab today for blood work.  I have ordered a liver ultrasound because of your history of hepatitis C and the slight elevation in liver enzymes. Labs to begin the work- up as well. Will look at repeat liver panel for trend. She may need Hepatology referral.   I have refilled your Zyrtec for environmental allergies.  I have refilled your Paxil for history of anxiety/ depression.  BP is elevated. Follow-up weekly  x3 for nurse visits to check  blood pressure since you do not have a blood pressure cuff at home.  If your blood pressure remains elevated, I will adjust your blood pressure medicine.  We have confirmed that you are taking the amlodipine 5 mg and the olmesartan 20 mg separately since your insurance will not pay for the combination drug Azor.  Continue to eat a healthy diabetic diet and follow-up with your endocrinologist.   Medication side effects discussed with patient.  Goals of patient care discussed including medical medication compliance and adequate follow-up.   Follow-up office visit in 1 month to address blood pressure, liver, and cholesterol medication.  Follow-up: Return in about 4 weeks (around 06/17/2019).   Denice Paradise, NP

## 2019-05-20 NOTE — Patient Instructions (Addendum)
It was nice to meet you today.  Please go to the lab today for blood work.  I have ordered a liver ultrasound because of your history of hepatitis C and the slight elevation in liver enzymes.  I have refilled your Zyrtec for environmental allergies.  I have refilled your Paxil for history of anxiety/ depression.  Follow-up weekly x3 for nurse visits to check  blood pressure since you do not have a blood pressure cuff at home.  If your blood pressure remains elevated, I will adjust your blood pressure medicine.  We have confirmed that you are taking the amlodipine 5 mg and the olmesartan 20 mg separately since your insurance will not pay for the combination drug Azor.  Continue to eat a healthy diabetic diet and follow-up with your endocrinologist.   Follow-up office visit in 1 month to address blood pressure, liver, and cholesterol medication.

## 2019-05-20 NOTE — Telephone Encounter (Signed)
I called pt and left vm to call ofc to sch US abdomen.

## 2019-05-23 LAB — HCV RNA BY PCR, QN RFX GENO: HCV Quant Baseline: NOT DETECTED IU/mL

## 2019-05-23 LAB — HEPATITIS B SURFACE ANTIGEN: Hepatitis B Surface Ag: NONREACTIVE

## 2019-05-23 LAB — HEPATITIS B CORE ANTIBODY, IGM: Hep B C IgM: NONREACTIVE

## 2019-05-23 LAB — HEPATITIS B CORE ANTIBODY, TOTAL: Hep B Core Total Ab: NONREACTIVE

## 2019-05-23 LAB — HEPATITIS A ANTIBODY, TOTAL: Hepatitis A AB,Total: NONREACTIVE

## 2019-05-23 LAB — HEPATITIS B SURFACE ANTIBODY,QUALITATIVE: Hep B S Ab: NONREACTIVE

## 2019-05-24 ENCOUNTER — Ambulatory Visit
Admission: RE | Admit: 2019-05-24 | Discharge: 2019-05-24 | Disposition: A | Discharge status: Home / Self Care | Payer: 59 | Source: Ambulatory Visit | Attending: Nurse Practitioner | Admitting: Nurse Practitioner

## 2019-05-24 ENCOUNTER — Other Ambulatory Visit: Payer: Self-pay

## 2019-05-24 DIAGNOSIS — R7989 Other specified abnormal findings of blood chemistry: Secondary | ICD-10-CM | POA: Insufficient documentation

## 2019-05-24 DIAGNOSIS — Z8619 Personal history of other infectious and parasitic diseases: Secondary | ICD-10-CM | POA: Insufficient documentation

## 2019-06-17 ENCOUNTER — Other Ambulatory Visit: Payer: Self-pay

## 2019-06-20 ENCOUNTER — Ambulatory Visit (INDEPENDENT_AMBULATORY_CARE_PROVIDER_SITE_OTHER): Payer: 59 | Admitting: Nurse Practitioner

## 2019-06-20 ENCOUNTER — Encounter: Payer: Self-pay | Admitting: Nurse Practitioner

## 2019-06-20 ENCOUNTER — Other Ambulatory Visit: Payer: Self-pay

## 2019-06-20 VITALS — BP 138/60 | HR 85 | Temp 97.2°F | Ht 63.0 in | Wt 155.4 lb

## 2019-06-20 DIAGNOSIS — E785 Hyperlipidemia, unspecified: Secondary | ICD-10-CM | POA: Diagnosis not present

## 2019-06-20 DIAGNOSIS — R748 Abnormal levels of other serum enzymes: Secondary | ICD-10-CM | POA: Diagnosis not present

## 2019-06-20 DIAGNOSIS — K760 Fatty (change of) liver, not elsewhere classified: Secondary | ICD-10-CM | POA: Diagnosis not present

## 2019-06-20 DIAGNOSIS — Z8371 Family history of colonic polyps: Secondary | ICD-10-CM | POA: Diagnosis not present

## 2019-06-20 NOTE — Patient Instructions (Addendum)
It was really nice to see you again today.  I have put in a referral to gastroenterology to address your fatty liver, mildly elevated liver labs, and family history of colon polyps.  Please follow up in 3 mos.    Hypertension, Adult High blood pressure (hypertension) is when the force of blood pumping through the arteries is too strong. The arteries are the blood vessels that carry blood from the heart throughout the body. Hypertension forces the heart to work harder to pump blood and may cause arteries to become narrow or stiff. Untreated or uncontrolled hypertension can cause a heart attack, heart failure, a stroke, kidney disease, and other problems. A blood pressure reading consists of a higher number over a lower number. Ideally, your blood pressure should be below 120/80. The first ("top") number is called the systolic pressure. It is a measure of the pressure in your arteries as your heart beats. The second ("bottom") number is called the diastolic pressure. It is a measure of the pressure in your arteries as the heart relaxes. What are the causes? The exact cause of this condition is not known. There are some conditions that result in or are related to high blood pressure. What increases the risk? Some risk factors for high blood pressure are under your control. The following factors may make you more likely to develop this condition:  Smoking.  Having type 2 diabetes mellitus, high cholesterol, or both.  Not getting enough exercise or physical activity.  Being overweight.  Having too much fat, sugar, calories, or salt (sodium) in your diet.  Drinking too much alcohol. Some risk factors for high blood pressure may be difficult or impossible to change. Some of these factors include:  Having chronic kidney disease.  Having a family history of high blood pressure.  Age. Risk increases with age.  Race. You may be at higher risk if you are African American.  Gender. Men are at  higher risk than women before age 78. After age 69, women are at higher risk than men.  Having obstructive sleep apnea.  Stress. What are the signs or symptoms? High blood pressure may not cause symptoms. Very high blood pressure (hypertensive crisis) may cause:  Headache.  Anxiety.  Shortness of breath.  Nosebleed.  Nausea and vomiting.  Vision changes.  Severe chest pain.  Seizures. How is this diagnosed? This condition is diagnosed by measuring your blood pressure while you are seated, with your arm resting on a flat surface, your legs uncrossed, and your feet flat on the floor. The cuff of the blood pressure monitor will be placed directly against the skin of your upper arm at the level of your heart. It should be measured at least twice using the same arm. Certain conditions can cause a difference in blood pressure between your right and left arms. Certain factors can cause blood pressure readings to be lower or higher than normal for a short period of time:  When your blood pressure is higher when you are in a health care provider's office than when you are at home, this is called white coat hypertension. Most people with this condition do not need medicines.  When your blood pressure is higher at home than when you are in a health care provider's office, this is called masked hypertension. Most people with this condition may need medicines to control blood pressure. If you have a high blood pressure reading during one visit or you have normal blood pressure with other risk  factors, you may be asked to:  Return on a different day to have your blood pressure checked again.  Monitor your blood pressure at home for 1 week or longer. If you are diagnosed with hypertension, you may have other blood or imaging tests to help your health care provider understand your overall risk for other conditions. How is this treated? This condition is treated by making healthy lifestyle  changes, such as eating healthy foods, exercising more, and reducing your alcohol intake. Your health care provider may prescribe medicine if lifestyle changes are not enough to get your blood pressure under control, and if:  Your systolic blood pressure is above 130.  Your diastolic blood pressure is above 80. Your personal target blood pressure may vary depending on your medical conditions, your age, and other factors. Follow these instructions at home: Eating and drinking   Eat a diet that is high in fiber and potassium, and low in sodium, added sugar, and fat. An example eating plan is called the DASH (Dietary Approaches to Stop Hypertension) diet. To eat this way: ? Eat plenty of fresh fruits and vegetables. Try to fill one half of your plate at each meal with fruits and vegetables. ? Eat whole grains, such as whole-wheat pasta, brown rice, or whole-grain bread. Fill about one fourth of your plate with whole grains. ? Eat or drink low-fat dairy products, such as skim milk or low-fat yogurt. ? Avoid fatty cuts of meat, processed or cured meats, and poultry with skin. Fill about one fourth of your plate with lean proteins, such as fish, chicken without skin, beans, eggs, or tofu. ? Avoid pre-made and processed foods. These tend to be higher in sodium, added sugar, and fat.  Reduce your daily sodium intake. Most people with hypertension should eat less than 1,500 mg of sodium a day.  Do not drink alcohol if: ? Your health care provider tells you not to drink. ? You are pregnant, may be pregnant, or are planning to become pregnant.  If you drink alcohol: ? Limit how much you use to:  0-1 drink a day for women.  0-2 drinks a day for men. ? Be aware of how much alcohol is in your drink. In the [place] one drink equals one 12 oz bottle of beer (355 mL), one 5 oz glass of wine (148 mL), or one 1 oz glass of hard liquor (44 mL). Lifestyle   Work with your health care provider to maintain  a healthy body weight or to lose weight. Ask what an ideal weight is for you.  Get at least 30 minutes of exercise most days of the week. Activities may include walking, swimming, or biking.  Include exercise to strengthen your muscles (resistance exercise), such as Pilates or lifting weights, as part of your weekly exercise routine. Try to do these types of exercises for 30 minutes at least 3 days a week.  Do not use any products that contain nicotine or tobacco, such as cigarettes, e-cigarettes, and chewing tobacco. If you need help quitting, ask your health care provider.  Monitor your blood pressure at home as told by your health care provider.  Keep all follow-up visits as told by your health care provider. This is important. Medicines  Take over-the-counter and prescription medicines only as told by your health care provider. Follow directions carefully. Blood pressure medicines must be taken as prescribed.  Do not skip doses of blood pressure medicine. Doing this puts you at risk for problems  and can make the medicine less effective.  Ask your health care provider about side effects or reactions to medicines that you should watch for. Contact a health care provider if you:  Think you are having a reaction to a medicine you are taking.  Have headaches that keep coming back (recurring).  Feel dizzy.  Have swelling in your ankles.  Have trouble with your vision. Get help right away if you:  Develop a severe headache or confusion.  Have unusual weakness or numbness.  Feel faint.  Have severe pain in your chest or abdomen.  Vomit repeatedly.  Have trouble breathing. Summary  Hypertension is when the force of blood pumping through your arteries is too strong. If this condition is not controlled, it may put you at risk for serious complications.  Your personal target blood pressure may vary depending on your medical conditions, your age, and other factors. For most  people, a normal blood pressure is less than 120/80.  Hypertension is treated with lifestyle changes, medicines, or a combination of both. Lifestyle changes include losing weight, eating a healthy, low-sodium diet, exercising more, and limiting alcohol. This information is not intended to replace advice given to you by your health care provider. Make sure you discuss any questions you have with your health care provider. Document Revised: 10/07/2017 Document Reviewed: 10/07/2017 Elsevier Patient Education  [place] DASH stands for "Dietary Approaches to Stop Hypertension." The DASH eating plan is a healthy eating plan that has been shown to reduce high blood pressure (hypertension). It may also reduce your risk for type 2 diabetes, heart disease, and stroke. The DASH eating plan may also help with weight loss. What are tips for following this plan?  General guidelines  Avoid eating more than 2,300 mg (milligrams) of salt (sodium) a day. If you have hypertension, you may need to reduce your sodium intake to 1,500 mg a day.  Limit alcohol intake to no more than 1 drink a day for nonpregnant women and 2 drinks a day for men. One drink equals 12 oz of beer, 5 oz of wine, or 1 oz of hard liquor.  Work with your health care provider to maintain a healthy body weight or to lose weight. Ask what an ideal weight is for you.  Get at least 30 minutes of exercise that causes your heart to beat faster (aerobic exercise) most days of the week. Activities may include walking, swimming, or biking.  Work with your health care provider or diet and nutrition specialist (dietitian) to adjust your eating plan to your individual calorie needs. Reading food labels   Check food labels for the amount of sodium per serving. Choose foods with less than 5 percent of the Daily Value of sodium. Generally, foods with less than 300 mg of sodium per serving fit into this eating plan.  To find  whole grains, look for the word "whole" as the first word in the ingredient list. Shopping  Buy products labeled as "low-sodium" or "no salt added."  Buy fresh foods. Avoid canned foods and premade or frozen meals. Cooking  Avoid adding salt when cooking. Use salt-free seasonings or herbs instead of table salt or sea salt. Check with your health care provider or pharmacist before using salt substitutes.  Do not fry foods. Cook foods using healthy methods such as baking, boiling, grilling, and broiling instead.  Cook with heart-healthy oils, such as olive, canola, soybean, or sunflower oil. Meal planning  Eat  a balanced diet that includes: ? 5 or more servings of fruits and vegetables each day. At each meal, try to fill half of your plate with fruits and vegetables. ? Up to 6-8 servings of whole grains each day. ? Less than 6 oz of lean meat, poultry, or fish each day. A 3-oz serving of meat is about the same size as a deck of cards. One egg equals 1 oz. ? 2 servings of low-fat dairy each day. ? A serving of nuts, seeds, or beans 5 times each week. ? Heart-healthy fats. Healthy fats called Omega-3 fatty acids are found in foods such as flaxseeds and coldwater fish, like sardines, salmon, and mackerel.  Limit how much you eat of the following: ? Canned or prepackaged foods. ? Food that is high in trans fat, such as fried foods. ? Food that is high in saturated fat, such as fatty meat. ? Sweets, desserts, sugary drinks, and other foods with added sugar. ? Full-fat dairy products.  Do not salt foods before eating.  Try to eat at least 2 vegetarian meals each week.  Eat more home-cooked food and less restaurant, buffet, and fast food.  When eating at a restaurant, ask that your food be prepared with less salt or no salt, if possible. What foods are recommended? The items listed may not be a complete list. Talk with your dietitian about what dietary choices are best for  you. Grains Whole-grain or whole-wheat bread. Whole-grain or whole-wheat pasta. Brown rice. [place]. Bulgur. Whole-grain and low-sodium cereals. Pita bread. Low-fat, low-sodium crackers. Whole-wheat flour tortillas. Vegetables Fresh or frozen vegetables (raw, steamed, roasted, or grilled). Low-sodium or reduced-sodium tomato and vegetable juice. Low-sodium or reduced-sodium tomato sauce and tomato paste. Low-sodium or reduced-sodium canned vegetables. Fruits All fresh, dried, or frozen fruit. Canned fruit in natural juice (without added sugar). Meat and other protein foods Skinless chicken or [place]. Ground chicken or [place]. Pork with fat trimmed off. Fish and seafood. Egg whites. Dried beans, peas, or lentils. Unsalted nuts, nut butters, and seeds. Unsalted canned beans. Lean cuts of beef with fat trimmed off. Low-sodium, lean deli meat. Dairy Low-fat (1%) or fat-free (skim) milk. Fat-free, low-fat, or reduced-fat cheeses. Nonfat, low-sodium ricotta or cottage cheese. Low-fat or nonfat yogurt. Low-fat, low-sodium cheese. Fats and oils Soft margarine without trans fats. Vegetable oil. Low-fat, reduced-fat, or light mayonnaise and salad dressings (reduced-sodium). Canola, safflower, olive, soybean, and sunflower oils. Avocado. Seasoning and other foods Herbs. Spices. Seasoning mixes without salt. Unsalted popcorn and pretzels. Fat-free sweets. What foods are not recommended? The items listed may not be a complete list. Talk with your dietitian about what dietary choices are best for you. Grains Baked goods made with fat, such as croissants, muffins, or some breads. Dry pasta or rice meal packs. Vegetables Creamed or fried vegetables. Vegetables in a cheese sauce. Regular canned vegetables (not low-sodium or reduced-sodium). Regular canned tomato sauce and paste (not low-sodium or reduced-sodium). Regular tomato and vegetable juice (not low-sodium or reduced-sodium). Angie Fava.  Olives. Fruits Canned fruit in a light or heavy syrup. Fried fruit. Fruit in cream or butter sauce. Meat and other protein foods Fatty cuts of meat. Ribs. Fried meat. Berniece Salines. Sausage. Bologna and other processed lunch meats. Salami. Fatback. Hotdogs. Bratwurst. Salted nuts and seeds. Canned beans with added salt. Canned or smoked fish. Whole eggs or egg yolks. Chicken or [place] with skin. Dairy Whole or 2% milk, cream, and half-and-half. Whole or full-fat cream cheese. Whole-fat or sweetened yogurt. Full-fat  cheese. Nondairy creamers. Whipped toppings. Processed cheese and cheese spreads. Fats and oils Butter. Stick margarine. Lard. Shortening. Ghee. Bacon fat. Tropical oils, such as coconut, palm kernel, or palm oil. Seasoning and other foods Salted popcorn and pretzels. Onion salt, garlic salt, seasoned salt, table salt, and sea salt. Worcestershire sauce. Tartar sauce. Barbecue sauce. Teriyaki sauce. Soy sauce, including reduced-sodium. Steak sauce. Canned and packaged gravies. Fish sauce. Oyster sauce. Cocktail sauce. Horseradish that you find on the shelf. Ketchup. Mustard. Meat flavorings and tenderizers. Bouillon cubes. Hot sauce and Tabasco sauce. Premade or packaged marinades. Premade or packaged taco seasonings. Relishes. Regular salad dressings. Where to find more information:  National Heart, Lung, and [place]: https://wilson-eaton.com/  American Heart Association: www.heart.org Summary  The DASH eating plan is a healthy eating plan that has been shown to reduce high blood pressure (hypertension). It may also reduce your risk for type 2 diabetes, heart disease, and stroke.  With the DASH eating plan, you should limit salt (sodium) intake to 2,300 mg a day. If you have hypertension, you may need to reduce your sodium intake to 1,500 mg a day.  When on the DASH eating plan, aim to eat more fresh fruits and vegetables, whole grains, lean proteins, low-fat dairy, and heart-healthy  fats.  Work with your health care provider or diet and nutrition specialist (dietitian) to adjust your eating plan to your individual calorie needs. This information is not intended to replace advice given to you by your health care provider. Make sure you discuss any questions you have with your health care provider. Document Revised: 01/09/2017 Document Reviewed: 01/21/2016 Elsevier Patient Education  2020 [employer].

## 2019-06-20 NOTE — Progress Notes (Signed)
Established Patient Office Visit  Subjective:  Patient ID: Diana Dixon, female    DOB: 1960-05-02  Age: 59 y.o. MRN: [id number]  CC:  Chief Complaint  Patient presents with  . Follow-up    hypertension    HPI [name] presents for follow-up of HTN, not at goal of <120/80. She also had a recent ABD US showing FL, liver cysts in follow- up of mildly elevated LFTs.   HTN: She has been taking amlodipine 5 mg daily, olmesartan 20 mg daily, both in the am. She  denies any chest pain, pressure, shortness of breath, DOE, or edema.  She has noted no dizziness or lightheadedness.  Patient brings in blood pressure readings: BP AM before she takes her meds 145/53-90  and the PM is 8 pm: 112/51-98- 120/60's.    BP Readings from Last 3 Encounters:  06/20/19 138/60  05/20/19 (!) 152/60  05/09/19 (!) 132/40    Elevated LFTs: She was prescribed  Pravachol 06/22/2018. Lab review shows 11/11/2018: AST 98, ALT 91, total bili 0.5, Alk phos 114. She has not taken any Pravachol for most of 2020 because she thought her lipids were at goal. She did not have any myalgias or problems with the medication. She just re started taking Pravachol 20 mg daily ast month.  HLD: Recent lipids with LDL 133. Her LFTs are elevated unrelated to the statin use.   Lab studies negative for viral hepatitis.  She reports that she had hepatitis medication treatment in the past.  HCV RNA by PCR is negative.  She does not show immunity to hepatitis A or B.  She will benefit from eventual Twinrix.  Her most recent Covid vaccine was 05/12/2019.  Our clinic is not recommending routine vaccinations for 2 months following Covid vaccines.    Abd [place] 05/24/2019: Mildly increased echogenicity of the liver, simple 1.0 cm right hepatic lobe cyst, no obvious focal solid liver lesions, simple bilateral renal cysts.  No evidence of obstructive uropathy.  Diabetes mellitus type 1 followed by endocrinology.  Lab Results  Component Value  Date   HGBA1C 7.6 (A) 05/09/2019   FH colon polyps: Mother has hx of colon polyps and gets frequent colonoscopies. Maternal aunt deceased with colon cancer 64. It looks like Zarriah's last colonoscopy was done in 2013- results not available in Epic.   Past Medical History:  Diagnosis Date  . Allergic rhinitis 05/20/2019  . Anxiety   . Depression   . Diabetes mellitus type I ([place])   . Diabetes mellitus without complication (HCC)    Type I  . Generalized anxiety disorder   . GERD (gastroesophageal reflux disease)   . Heart murmur   . History of hepatitis C virus infection 20 years ago   She can't recall but did get treated for HCV and it was resolved.   . Hypertension   . Major depressive disorder, single episode, moderate ([place])     Past Surgical History:  Procedure Laterality Date  . ABDOMINAL HYSTERECTOMY    . PARS PLANA VITRECTOMY Left 10/11/2014   Procedure: PARS PLANA VITRECTOMY WITH 25 GAUGE,CATARACT EXTRACTION WITH IOL INSERTION , ENDOLASER;  Surgeon: Milus Height, MD;  Location: [facility] ORS;  Service: Ophthalmology;  Laterality: Left;  [place] AP5  CDE CASETTE LOT # [id number]  . SHOULDER SURGERY    . TONSILLECTOMY    . TONSILLECTOMY      Family History  Problem Relation Age of Onset  . Thyroid disease Mother   . Breast  cancer Mother 66  . Hypertension Mother   . Depression Mother   . Anxiety disorder Mother   . Colon polyps Mother   . Breast cancer Maternal Aunt 15  . Cancer Maternal Aunt   . Breast cancer Cousin 61    Social History   Socioeconomic History  . Marital status: Single    Spouse name: Not on file  . Number of children: 1  . Years of education: Not on file  . Highest education level: Some college, no degree  Occupational History    Comment: part time  Tobacco Use  . Smoking status: Never Smoker  . Smokeless tobacco: Never Used  Substance and Sexual Activity  . Alcohol use: No    Alcohol/week: 0.0 standard drinks  . Drug use: No  . Sexual  activity: Not Currently    Partners: Male  Other Topics Concern  . Not on file  Social History Narrative   Regular exercise: walk   Caffeine use: sodas   Social Determinants of Health   Financial Resource Strain:   . Difficulty of Paying Living Expenses:   Food Insecurity:   . Worried About Charity fundraiser in the Last Year:   . Arboriculturist in the Last Year:   Transportation Needs:   . Film/video editor (Medical):   Marland Kitchen Lack of Transportation (Non-Medical):   Physical Activity:   . Days of Exercise per Week:   . Minutes of Exercise per Session:   Stress:   . Feeling of Stress :   Social Connections:   . Frequency of Communication with Friends and Family:   . Frequency of Social Gatherings with Friends and Family:   . Attends Religious Services:   . Active Member of Clubs or Organizations:   . Attends Archivist Meetings:   Marland Kitchen Marital Status:   Intimate Partner Violence:   . Fear of Current or Ex-Partner:   . Emotionally Abused:   Marland Kitchen Physically Abused:   . Sexually Abused:     Outpatient Medications Prior to Visit  Medication Sig Dispense Refill  . amLODipine (NORVASC) 5 MG tablet Take 1 tablet (5 mg total) by mouth daily. 90 tablet 3  . BD PEN NEEDLE NANO U/F 32G X 4 MM MISC USE TO INJECT INSULIN AS DIRECTED 4 TIMES A DAY 200 each 2  . Blood Glucose Monitoring Suppl (ONE TOUCH ULTRA 2) w/Device KIT Use Onetouch Ultra to check blood sugar 2 times daily. 1 kit 0  . cetirizine (ZYRTEC) 10 MG tablet Take 1 tablet (10 mg total) by mouth daily. 30 tablet 3  . Continuous Blood Gluc Sensor (FREESTYLE LIBRE 14 DAY SENSOR) MISC Use one sensor every 14 days to monitor blood sugars. 2 each 3  . glucose blood (ONETOUCH ULTRA) test strip Use Onetouch ultra test strips as instructed to check blood sugar 4 times daily. 150 each 2  . hydrocortisone 2.5 % cream     . insulin aspart (NOVOLOG FLEXPEN) 100 UNIT/ML FlexPen Inject 10 Units into the skin 3 (three) times daily  with meals. Inject 10 units under the skin three times daily.    . insulin glargine (LANTUS SOLOSTAR) 100 UNIT/ML Solostar Pen Inject into the skin 2 (two) times daily. Inject 18 units under the skin in the morning and 28 units in the evening.    . olmesartan (BENICAR) 20 MG tablet Take 1 tablet (20 mg total) by mouth daily. 90 tablet 3  . OneTouch Delica Lancets 46K  MISC 1 each by Does not apply route in the morning and at bedtime. Use onetouch delica lancets to check blood sugar twice daily. 100 each 2  . pantoprazole (PROTONIX) 40 MG tablet TAKE 1 TABLET BY MOUTH ONCE A DAY 30 tablet 2  . PARoxetine (PAXIL) 20 MG tablet Take 1.5 tablets (30 mg total) by mouth daily. 90 tablet 1  . pravastatin (PRAVACHOL) 40 MG tablet TAKE 1 TABLET BY MOUTH ONCE A DAY 30 tablet 3   No facility-administered medications prior to visit.    Allergies  Allergen Reactions  . Penicillins   . Sulfa Antibiotics     ROS Review of Systems    Objective:    Physical Exam  BP 138/60 (BP Location: Left Arm, Patient Position: Sitting, Cuff Size: Small)   Pulse 85   Temp (!) 97.2 F (36.2 C) (Skin)   Ht 5' 3"  (1.6 m)   Wt 155 lb 6.4 oz (70.5 kg)   SpO2 97%   BMI 27.53 kg/m  Wt Readings from Last 3 Encounters:  06/20/19 155 lb 6.4 oz (70.5 kg)  05/20/19 154 lb 12.8 oz (70.2 kg)  05/09/19 154 lb (69.9 kg)     Health Maintenance Due  Topic Date Due  . PNEUMOCOCCAL POLYSACCHARIDE VACCINE AGE 15-64 HIGH RISK  Never done  . HIV Screening  Never done  . COLONOSCOPY  Never done  . OPHTHALMOLOGY EXAM  05/20/2016  . PAP SMEAR-Modifier  11/08/2017    There are no preventive care reminders to display for this patient.  Lab Results  Component Value Date   TSH 0.83 08/26/2017   Lab Results  Component Value Date   WBC 5.9 05/20/2019   HGB 12.1 05/20/2019   HCT 36.9 05/20/2019   MCV 80.0 05/20/2019   PLT 235.0 05/20/2019   Lab Results  Component Value Date   NA 136 05/09/2019   K 4.4 05/09/2019    CO2 26 05/09/2019   GLUCOSE 174 (H) 05/09/2019   BUN 18 05/09/2019   CREATININE 0.96 05/09/2019   BILITOT 0.5 05/20/2019   ALKPHOS 123 (H) 05/20/2019   AST 41 (H) 05/20/2019   ALT 45 (H) 05/20/2019   PROT 6.8 05/20/2019   ALBUMIN 4.3 05/20/2019   CALCIUM 9.3 05/09/2019   ANIONGAP 4 (L) 05/02/2013   GFR 59.62 (L) 05/09/2019   Lab Results  Component Value Date   CHOL 198 05/09/2019   Lab Results  Component Value Date   HDL 46.50 05/09/2019   Lab Results  Component Value Date   LDLCALC 133 (H) 05/09/2019   Lab Results  Component Value Date   TRIG 92.0 05/09/2019   Lab Results  Component Value Date   CHOLHDL 4 05/09/2019   Lab Results  Component Value Date   HGBA1C 7.6 (A) 05/09/2019   EXAM: ABDOMEN ULTRASOUND COMPLETE  COMPARISON:  CT 05/01/2013  FINDINGS: Gallbladder: No gallstones or wall thickening visualized. No sonographic Murphy sign noted by sonographer.  Common bile duct: Diameter: 2 mm  Liver: 1.0 cm simple cyst within the right hepatic lobe. No solid mass identified. Mildly increased hepatic parenchymal echogenicity. Portal vein is patent on color Doppler imaging with normal direction of blood flow towards the liver.  IVC: No abnormality visualized.  Pancreas: Visualized portion unremarkable.  Spleen: Size and appearance within normal limits.  Right Kidney: Length: 9.7. Echogenicity within normal limits. 1.6 cm simple cyst within the mid to superior pole. No solid mass or hydronephrosis visualized.  Left Kidney: Length: 10. 4. 1.7  cm midpole simple cyst. No solid or cystic mass or hydronephrosis visualized.  Abdominal aorta: No aneurysm visualized.  Other findings: None.  IMPRESSION: 1. The echogenicity of the liver is mildly increased. This is a nonspecific finding but is most commonly seen with fatty infiltration of the liver. There are no obvious focal solid liver lesions. 2. Simple 1.0 cm right hepatic lobe cyst. 3.  Simple bilateral renal cysts. No evidence of obstructive uropathy.   Electronically Signed   By: Davina Poke D.O.   On: 05/24/2019 13:32   Assessment & Plan:   Problem List Items Addressed This Visit    None    Visit Diagnoses    FH: colon polyps    -  Primary   Relevant Orders   Ambulatory referral to Gastroenterology   Hepatic steatosis       Relevant Orders   Ambulatory referral to Gastroenterology   Elevated liver enzymes       Relevant Orders   Ambulatory referral to Gastroenterology   Hepatic function panel   Hyperlipidemia, unspecified hyperlipidemia type       Relevant Orders   Lipid Profile     Elevated liver labs are incompletely evaluated. She has not had lipid panel at goal.She has been on and off Pravachol all last year. She did not take it long. She is taking Pravachol  now and will need to monitor liver panel closely. Check liver/lipid fasting next month.    I have put in a referral to gastroenterology to address fatty liver, mildly elevated liver labs, hx of likely HCV treatment. Also, family history of colon polyps.She reports past treatment for Hepatitis has no recollection of medication taken or the type of  Hepatitis. She has no immunity to Hep A/B- so likely she had HCV treatment. She is HCV negative now. Will consider Twin Rx- at [facility] since she just had Covid vaccine.   Check on dividing BP meds so she does not have elevated BP in the morning. Will review with Catie, PharmD.  Patient was advised to continue checking her blood pressure readings and record those, bring a blood pressure cuff to the next office visit.  We will make adjustments as needed if she is not at goal.  Counseled regarding lifestyle, healthy diet exercise and treatment for fatty liver.  She is aware that each of these are independent risk factors for cardiovascular disease.  Please follow up in 3 mos.   No orders of the defined types were placed in this encounter.  This visit  occurred during the SARS-CoV-2 public health emergency.  Safety protocols were in place, including screening questions prior to the visit, additional usage of staff PPE, and extensive cleaning of exam room while observing appropriate contact time as indicated for disinfecting solutions.   Follow-up: Return in about 3 months (around 09/20/2019).    Denice Paradise, NP

## 2019-06-21 ENCOUNTER — Telehealth: Payer: Self-pay | Admitting: Nurse Practitioner

## 2019-06-21 NOTE — Telephone Encounter (Signed)
Scheduled patient lab appt for 07/22/19 at 8:30am. Left message on VM of appt date and time and advised patient if this does not work can call back to reschedule.

## 2019-06-21 NOTE — Telephone Encounter (Signed)
Dawson Bills, NP spoke with patient this am via phone

## 2019-06-21 NOTE — Telephone Encounter (Signed)
I telephoned Diana Dixon for a question today and LMOM for her to call the office.

## 2019-06-21 NOTE — Telephone Encounter (Signed)
Please arrange fasting lab 0800 liver/lipid in 1 month. And leave date/time on her answering machine as she is at work.

## 2019-07-22 ENCOUNTER — Other Ambulatory Visit: Payer: 59

## 2019-07-28 ENCOUNTER — Other Ambulatory Visit: Payer: 59

## 2019-08-16 ENCOUNTER — Other Ambulatory Visit: Payer: Self-pay

## 2019-08-16 ENCOUNTER — Ambulatory Visit (INDEPENDENT_AMBULATORY_CARE_PROVIDER_SITE_OTHER): Payer: 59 | Admitting: Gastroenterology

## 2019-08-16 VITALS — BP 153/63 | HR 82 | Temp 98.9°F | Ht 63.0 in | Wt 154.0 lb

## 2019-08-16 DIAGNOSIS — Z8371 Family history of colonic polyps: Secondary | ICD-10-CM

## 2019-08-16 DIAGNOSIS — Z23 Encounter for immunization: Secondary | ICD-10-CM

## 2019-08-16 DIAGNOSIS — R945 Abnormal results of liver function studies: Secondary | ICD-10-CM

## 2019-08-16 DIAGNOSIS — R7989 Other specified abnormal findings of blood chemistry: Secondary | ICD-10-CM

## 2019-08-16 MED ORDER — SUTAB 1479-225-188 MG PO TABS: 1.0000 | ORAL_TABLET | Freq: Once | ORAL | 0 refills | Status: DC

## 2019-08-16 NOTE — Addendum Note (Signed)
Addended by: Dorethea Clan on: 08/16/2019 03:59 PM   Modules accepted: Orders

## 2019-08-16 NOTE — Progress Notes (Signed)
Jonathon Bellows MD, MRCP(U.K) [address]  [place]  [place], [place] [address]  Main: [phone]  Fax: [phone]   Gastroenterology Consultation  Referring Provider:     Marval Regal, NP Primary Care Physician:  Marval Regal, NP Primary Gastroenterologist:  Dr. Jonathon Bellows  Reason for Consultation:     Family history of colon polyps, abnormal LFTs.        HPI:   Diana Dixon is a 59 y.o. y/o female referred for consultation & management  by  Marval Regal, NP.   Has been referred for abnormal LFTs in the family history of colon polyps.  05/24/2019 right upper quadrant ultrasound performed for hepatitis C.  Fatty infiltration of the liver possible.  Clinically 1 cm right hepatic lobe cyst.  05/09/2019: AST 49 ALT 46 alkaline phosphatase 120.  05/20/2019 alkaline phosphatase 123, AST 41, ALT 45.  Hepatitis A total antibody negative.  Hepatitis B core total antibody, hepatitis B surface antibody, hepatitis B surface antigen and hepatitis B core total IgM antibody nonreactive.  Hepatitis C virus RNA not detected.  Hemoglobin 12.1 g.   She denies any excess alcohol consumption, illegal drug use, tattoos, military service, family history of liver disease.  Denies any over-the-counter herbal supplements or medications.  Last colonoscopy in 2013 recollects was normal.  Mother had colon polyps.  Past Medical History:  Diagnosis Date  . Allergic rhinitis 05/20/2019  . Anxiety   . Depression   . Diabetes mellitus type I ([place])   . Diabetes mellitus without complication (HCC)    Type I  . Generalized anxiety disorder   . GERD (gastroesophageal reflux disease)   . Heart murmur   . History of hepatitis C virus infection 20 years ago   She can't recall but did get treated for HCV and it was resolved.   . Hypertension   . Major depressive disorder, single episode, moderate ([place])     Past Surgical History:  Procedure Laterality Date  . ABDOMINAL HYSTERECTOMY    .  PARS PLANA VITRECTOMY Left 10/11/2014   Procedure: PARS PLANA VITRECTOMY WITH 25 GAUGE,CATARACT EXTRACTION WITH IOL INSERTION , ENDOLASER;  Surgeon: Milus Height, MD;  Location: [facility] ORS;  Service: Ophthalmology;  Laterality: Left;  [place] AP5  CDE CASETTE LOT # [id number] h  . SHOULDER SURGERY    . TONSILLECTOMY    . TONSILLECTOMY      Prior to Admission medications   Medication Sig Start Date End Date Taking? Authorizing Provider  amLODipine (NORVASC) 5 MG tablet Take 1 tablet (5 mg total) by mouth daily. 05/20/19   Marval Regal, NP  BD PEN NEEDLE NANO U/F 32G X 4 MM MISC USE TO INJECT INSULIN AS DIRECTED 4 TIMES A DAY 04/18/19   Elayne Snare, MD  Blood Glucose Monitoring Suppl (ONE TOUCH ULTRA 2) w/Device KIT Use Onetouch Ultra to check blood sugar 2 times daily. 03/28/19   Elayne Snare, MD  cetirizine (ZYRTEC) 10 MG tablet Take 1 tablet (10 mg total) by mouth daily. 05/20/19   Marval Regal, NP  Continuous Blood Gluc Sensor (FREESTYLE LIBRE 14 DAY SENSOR) MISC Use one sensor every 14 days to monitor blood sugars. 05/09/19   Elayne Snare, MD  glucose blood (ONETOUCH ULTRA) test strip Use Onetouch ultra test strips as instructed to check blood sugar 4 times daily. 05/09/19   Elayne Snare, MD  hydrocortisone 2.5 % cream  05/16/19   [provider]  insulin aspart (NOVOLOG FLEXPEN) 100  UNIT/ML FlexPen Inject 10 Units into the skin 3 (three) times daily with meals. Inject 10 units under the skin three times daily.    [provider]  insulin glargine (LANTUS SOLOSTAR) 100 UNIT/ML Solostar Pen Inject into the skin 2 (two) times daily. Inject 18 units under the skin in the morning and 28 units in the evening.    [provider]  olmesartan (BENICAR) 20 MG tablet Take 1 tablet (20 mg total) by mouth daily. 05/20/19   Marval Regal, NP  OneTouch Delica Lancets 10Y MISC 1 each by Does not apply route in the morning and at bedtime. Use onetouch delica lancets to check blood sugar  twice daily. 03/28/19   Elayne Snare, MD  pantoprazole (PROTONIX) 40 MG tablet TAKE 1 TABLET BY MOUTH ONCE A DAY 11/22/17   Elayne Snare, MD  PARoxetine (PAXIL) 20 MG tablet Take 1.5 tablets (30 mg total) by mouth daily. 05/20/19   Marval Regal, NP  pravastatin (PRAVACHOL) 40 MG tablet TAKE 1 TABLET BY MOUTH ONCE A DAY 06/22/18   Elayne Snare, MD    Family History  Problem Relation Age of Onset  . Thyroid disease Mother   . Breast cancer Mother 33  . Hypertension Mother   . Depression Mother   . Anxiety disorder Mother   . Colon polyps Mother   . Breast cancer Maternal Aunt 23  . Cancer Maternal Aunt   . Breast cancer Cousin 68     Social History   Tobacco Use  . Smoking status: Never Smoker  . Smokeless tobacco: Never Used  Vaping Use  . Vaping Use: Never used  Substance Use Topics  . Alcohol use: No    Alcohol/week: 0.0 standard drinks  . Drug use: No    Allergies as of 08/16/2019 - Review Complete 06/20/2019  Allergen Reaction Noted  . Penicillins  09/17/2012  . Sulfa antibiotics  09/17/2012    Review of Systems:    All systems reviewed and negative except where noted in HPI.   Physical Exam:  There were no vitals taken for this visit. No LMP recorded. Patient has had a hysterectomy. Psych:  Alert and cooperative. Normal mood and affect. General:   Alert,  Well-developed, well-nourished, pleasant and cooperative in NAD Head:  Normocephalic and atraumatic. Eyes:  Sclera clear, no icterus.   Conjunctiva pink. Ears:  Normal auditory acuity. Lungs:  Respirations even and unlabored.  Clear throughout to auscultation.   No wheezes, crackles, or rhonchi. No acute distress. Heart:  Regular rate and rhythm; no murmurs, clicks, rubs, or gallops. Abdomen:  Normal bowel sounds.  No bruits.  Soft, non-tender and non-distended without masses, hepatosplenomegaly or hernias noted.  No guarding or rebound tenderness.    Neurologic:  Alert and oriented x3;  grossly normal  neurologically. Psych:  Alert and cooperative. Normal mood and affect.  Imaging Studies: No results found.  Assessment and Plan:   Diana Dixon is a 59 y.o. y/o female has been referred for abnormal LFTs and family history of colon polyps.  Liver ultrasound shows possible fatty infiltration.  Plan 1.  Complete work-up for autoimmune hepatitis, genetic conditions. 2.  Completed his hepatitis a and B vaccine as she is not immune to the same. 3.  Screening colonoscopy due to family history of colon polyps in mother 32.  Patient information on fatty liver disease.  I have discussed alternative options, risks & benefits,  which include, but are not limited to, bleeding, infection, perforation,respiratory  complication & drug reaction.  The patient agrees with this plan & written consent will be obtained.     Follow up in 4 to 6 weeks telephone visit  Dr Jonathon Bellows MD,MRCP(U.K)

## 2019-08-16 NOTE — Patient Instructions (Signed)
Fatty Liver Disease  Fatty liver disease occurs when too much fat has built up in your liver cells. Fatty liver disease is also called hepatic steatosis or steatohepatitis. The liver removes harmful substances from your bloodstream and produces fluids that your body needs. It also helps your body use and store energy from the food you eat. In many cases, fatty liver disease does not cause symptoms or problems. It is often diagnosed when tests are being done for other reasons. However, over time, fatty liver can cause inflammation that may lead to more serious liver problems, such as scarring of the liver (cirrhosis) and liver failure. Fatty liver is associated with insulin resistance, increased body fat, high blood pressure (hypertension), and high cholesterol. These are features of metabolic syndrome and increase your risk for stroke, diabetes, and heart disease. What are the causes? This condition may be caused by:  Drinking too much alcohol.  Poor nutrition.  Obesity.  Cushing's syndrome.  Diabetes.  High cholesterol.  Certain drugs.  Poisons.  Some viral infections.  Pregnancy. What increases the risk? You are more likely to develop this condition if you:  Abuse alcohol.  Are overweight.  Have diabetes.  Have hepatitis.  Have a high triglyceride level.  Are pregnant. What are the signs or symptoms? Fatty liver disease often does not cause symptoms. If symptoms do develop, they can include:  Fatigue.  Weakness.  Weight loss.  Confusion.  Abdominal pain.  Nausea and vomiting.  Yellowing of your skin and the white parts of your eyes (jaundice).  Itchy skin. How is this diagnosed? This condition may be diagnosed by:  A physical exam and medical history.  Blood tests.  Imaging tests, such as an ultrasound, CT scan, or MRI.  A liver biopsy. A small sample of liver tissue is removed using a needle. The sample is then looked at under a microscope. How  is this treated? Fatty liver disease is often caused by other health conditions. Treatment for fatty liver may involve medicines and lifestyle changes to manage conditions such as:  Alcoholism.  High cholesterol.  Diabetes.  Being overweight or obese. Follow these instructions at home:   Do not drink alcohol. If you have trouble quitting, ask your health care provider how to safely quit with the help of medicine or a supervised program. This is important to keep your condition from getting worse.  Eat a healthy diet as told by your health care provider. Ask your health care provider about working with a diet and nutrition specialist (dietitian) to develop an eating plan.  Exercise regularly. This can help you lose weight and control your cholesterol and diabetes. Talk to your health care provider about an exercise plan and which activities are best for you.  Take over-the-counter and prescription medicines only as told by your health care provider.  Keep all follow-up visits as told by your health care provider. This is important. Contact a health care provider if: You have trouble controlling your:  Blood sugar. This is especially important if you have diabetes.  Cholesterol.  Drinking of alcohol. Get help right away if:  You have abdominal pain.  You have jaundice.  You have nausea and vomiting.  You vomit blood or material that looks like coffee grounds.  You have stools that are black, tar-like, or bloody. Summary  Fatty liver disease develops when too much fat builds up in the cells of your liver.  Fatty liver disease often causes no symptoms or problems. However, over   time, fatty liver can cause inflammation that may lead to more serious liver problems, such as scarring of the liver (cirrhosis).  You are more likely to develop this condition if you abuse alcohol, are pregnant, are overweight, have diabetes, have hepatitis, or have high triglyceride  levels.  Contact your health care provider if you have trouble controlling your weight, blood sugar, cholesterol, or drinking of alcohol. This information is not intended to replace advice given to you by your health care provider. Make sure you discuss any questions you have with your health care provider. Document Revised: 01/09/2017 Document Reviewed: 11/05/2016 Elsevier Patient Education  2020 [employer]  

## 2019-08-18 ENCOUNTER — Encounter: Payer: Self-pay | Admitting: Gastroenterology

## 2019-08-22 ENCOUNTER — Telehealth: Payer: Self-pay | Admitting: Gastroenterology

## 2019-08-22 ENCOUNTER — Telehealth: Payer: Self-pay

## 2019-08-22 NOTE — Telephone Encounter (Signed)
You  Shon Baton, Karlynn L Just now (11:07 AM)   I will send this to Dr. Vicente Males.   This MyChart message has not been read.  Loistine Simas B routed conversation to You 15 minutes ago (10:52 AM)  Scarlette Ar  Mychart, Generic 47 minutes ago (10:20 AM)   I had a heriics shot on the 6 of july Im surpose to have another in two weeks. So will I have that on July 16.  Also i read my blood stuff.  No one ever called. Didn't understand the results.   Mychart, Generic  Shon Baton, Annie L 6 days ago  GM Appointment Information:     Visit Type: Virtual Office Visit         Date: 10/04/2019                 Dept: [facility]                 Provider: Jonathon Bellows                 Time: 1:00 PM                 Length: 20 min   Appt Status: Scheduled

## 2019-08-22 NOTE — Telephone Encounter (Signed)
-----   Message from Jonathon Bellows, MD sent at 08/18/2019 11:27 AM EDT ----- [place] patient 1.  Not immune to hepatitis A and B needs vaccine2.  ANA and smooth muscle antibody are positive I will discuss at follow-up next steps3.  Tissue transglutaminase antibody which is indicative of celiac disease is positive antigliadin antibody also seen in celiac disease is positive I suggest we proceed with upper endoscopy to evaluate for celiac disease.  10% of individuals with celiac disease have abnormal LFTs.  If she is willing please schedule.  Stay on a regular normal diet till we do the endoscopy.  Do not change what she is eating  C/c Marval Regal, NP   Dr Jonathon Bellows MD,MRCP [facility]) Gastroenterology/Hepatology Pager: [phone]

## 2019-08-22 NOTE — Telephone Encounter (Signed)
Spoke with pt and informed her of results and Dr. Georgeann Oppenheim recommendations. Pt understands and agrees.

## 2019-08-22 NOTE — Telephone Encounter (Signed)
Called pt to inform her of lab results and Dr. Anna's recommendations.  Unable to contact, LVM to return call 

## 2019-08-22 NOTE — Telephone Encounter (Signed)
-----   Message from Jonathon Bellows, MD sent at 08/18/2019 11:27 AM EDT ----- [place] patient 1.  Not immune to hepatitis A and B needs vaccine2.  ANA and smooth muscle antibody are positive I will discuss at follow-up next steps3.  Tissue transglutaminase antibody which is indicative of celiac disease is positive antigliadin antibody also seen in celiac disease is positive I suggest we proceed with upper endoscopy to evaluate for celiac disease.  10% of individuals with celiac disease have abnormal LFTs.  If she is willing please schedule.  Stay on a regular normal diet till we do the endoscopy.  Do not change what she is eating  C/c Marval Regal, NP   Dr Jonathon Bellows MD,MRCP [facility]) Gastroenterology/Hepatology Pager: [phone]

## 2019-08-24 LAB — HEPATITIS B E ANTIGEN: Hep B E Ag: NEGATIVE

## 2019-08-24 LAB — HEPATITIS B E ANTIBODY: Hep B E Ab: NEGATIVE

## 2019-08-24 LAB — HEPATITIS B CORE ANTIBODY, TOTAL: Hep B Core Total Ab: NEGATIVE

## 2019-08-24 LAB — PTH, INTACT AND CALCIUM
Calcium: 9.7 mg/dL (ref 8.7–10.2)
PTH: 30 pg/mL (ref 15–65)

## 2019-08-24 LAB — GAMMA GT: GGT: 72 IU/L — ABNORMAL HIGH (ref 0–60)

## 2019-08-24 LAB — IMMUNOGLOBULINS A/E/G/M, SERUM
IgA/Immunoglobulin A, Serum: 74 mg/dL — ABNORMAL LOW (ref 87–352)
IgE (Immunoglobulin E), Serum: 17 IU/mL (ref 6–495)
IgG (Immunoglobin G), Serum: 853 mg/dL (ref 586–1602)
IgM (Immunoglobulin M), Srm: 18 mg/dL — ABNORMAL LOW (ref 26–217)

## 2019-08-24 LAB — MITOCHONDRIAL/SMOOTH MUSCLE AB PNL
Mitochondrial Ab: <20 Units (ref 0.0–20.0)
Smooth Muscle Ab: 72 Units — ABNORMAL HIGH (ref 0–19)

## 2019-08-24 LAB — IRON,TIBC AND FERRITIN PANEL
Ferritin: 24 ng/mL (ref 15–150)
Iron Saturation: 17 % (ref 15–55)
Iron: 69 ug/dL (ref 27–159)
Total Iron Binding Capacity: 407 ug/dL (ref 250–450)
UIBC: 338 ug/dL (ref 131–425)

## 2019-08-24 LAB — CELIAC DISEASE AB SCREEN W/RFX
Antigliadin Abs, IgA: 48 units — ABNORMAL HIGH (ref 0–19)
Transglutaminase IgA: 18 U/mL — ABNORMAL HIGH (ref 0–3)

## 2019-08-24 LAB — CERULOPLASMIN: Ceruloplasmin: 30.8 mg/dL (ref 19.0–39.0)

## 2019-08-24 LAB — HIV ANTIBODY (ROUTINE TESTING W REFLEX): HIV Screen 4th Generation wRfx: NONREACTIVE

## 2019-08-24 LAB — CK: Total CK: 130 U/L (ref 32–182)

## 2019-08-24 LAB — ALPHA-1-ANTITRYPSIN: A-1 Antitrypsin: 149 mg/dL (ref 101–187)

## 2019-08-24 LAB — ANA: Anti Nuclear Antibody (ANA): POSITIVE — AB

## 2019-08-24 LAB — HEPATITIS C ANTIBODY: Hep C Virus Ab: <0.1 s/co ratio (ref 0.0–0.9)

## 2019-08-24 LAB — HEPATITIS B SURFACE ANTIGEN: Hepatitis B Surface Ag: NEGATIVE

## 2019-08-24 LAB — ENDOMYSIAL IGA ANTIBODY: Endomysial IgA: POSITIVE — AB

## 2019-08-24 LAB — ANTI-MICROSOMAL ANTIBODY LIVER / KIDNEY: LKM1 Ab: 0.9 Units (ref 0.0–20.0)

## 2019-09-09 ENCOUNTER — Ambulatory Visit (INDEPENDENT_AMBULATORY_CARE_PROVIDER_SITE_OTHER): Payer: 59 | Admitting: Endocrinology

## 2019-09-09 ENCOUNTER — Other Ambulatory Visit: Payer: Self-pay

## 2019-09-09 ENCOUNTER — Encounter: Payer: Self-pay | Admitting: Endocrinology

## 2019-09-09 VITALS — BP 140/64 | HR 81 | Ht <= 58 in | Wt 156.0 lb

## 2019-09-09 DIAGNOSIS — I10 Essential (primary) hypertension: Secondary | ICD-10-CM

## 2019-09-09 DIAGNOSIS — E1065 Type 1 diabetes mellitus with hyperglycemia: Secondary | ICD-10-CM

## 2019-09-09 DIAGNOSIS — E7800 Pure hypercholesterolemia, unspecified: Secondary | ICD-10-CM

## 2019-09-09 LAB — POCT GLYCOSYLATED HEMOGLOBIN (HGB A1C): Hemoglobin A1C: 8 % — AB (ref 4.0–5.6)

## 2019-09-09 NOTE — Progress Notes (Signed)
Patient ID: Diana Dixon, female   DOB: 23-May-1960, 59 y.o.   MRN: [id number]   Reason for Appointment:  follow-up   History of Present Illness   Diagnosis: Type 1 DIABETES MELITUS, diagnosis made in 1967      She has had long-standing type 1 diabetes with persistently poor control because of variability in blood sugars and difficulty with various self-care issues She has been reluctant to do carbohydrate counting to adjust her insulin doses and has variable readings after meals especially lunch Previously has had times when she would forget her Lantus in the evening and also mealtime dose She may have done a little better with taking Lantus twice a day but does not try to adjust the doses on her own    Recent history:  Insulin regimen: Lantus 18---28, Novolog 8-10 units at breakfast and 10 units at lunch and supper  She has had persistently poor control of her diabetes despite frequently advising her on insulin changes and better compliance for day-to-day management of diet, insulin regimen and glucose monitoring  A1c today is 8 compared to 7.6, previously as high as 8.6 in 2018 and has been as low as 7. 4  Current blood sugar patterns and problems identified:  She did use the freestyle libre after her last visit in March but she says she is not able to activate the sensor when she applies it especially on the right arm  Unable to get any data since her phone app could not be linked to our online account  With a fingerstick monitoring she is checking only morning readings  Also previously did not compared the freestyle libre today fingerstick to check for accuracy  Although she thinks that her blood sugars are high in the morning mostly because of morning rise appears that she does better when she takes additional NovoLog for her late evening snack which she did last night, blood sugar was only 112 this morning  Otherwise blood sugars in the mornings are as high as  404  She still continues to drink regular soft drinks at different times  Also blood sugars may be higher after eating cereal in the morning but she is not sure  Does not know why her sugars are consistently high the last 3 days including at lunchtime  She is trying to do some walking regularly  Weight is about the same  No recent hypoglycemia   DIET: May have unbalanced meals like Pop tarts, waffles or breakfast cereal.  May have high fat meats at meals.   Is drinking regular soft drinks and sweet tea at meals, less at breakfast    Glucometer: One Touch ultra 2        Blood Glucose readings from download:   FASTING blood sugar range 112-404 with average about 199 Late morning 158-104 Midday 200-271  Previous readings:  PRE-MEAL Fasting Lunch Dinner Bedtime Overall  Glucose range:  62-273  141, 111     Mean/median:  135    156   POST-MEAL PC Breakfast PC Lunch PC Dinner  Glucose range:    254  Mean/median:       Meals: 3 meals per day.  Eating eggs/meat for breakfast frequently without carbohydrate; dinner 5-6 pm          Physical activity: exercise:  walk at lunch  Wt Readings from Last 3 Encounters:  09/09/19 156 lb (70.8 kg)  08/16/19 154 lb (69.9 kg)  06/20/19 155 lb 6.4 oz (  70.5 kg)     Lab Results  Component Value Date   HGBA1C 8.0 (A) 09/09/2019   HGBA1C 7.6 (A) 05/09/2019   HGBA1C 7.8 (A) 08/26/2017   Lab Results  Component Value Date   MICROALBUR 1.1 05/09/2019   LDLCALC 133 (H) 05/09/2019   CREATININE 0.96 05/09/2019    Lab Results  Component Value Date   MICROALBUR 1.1 05/09/2019    Allergies as of 09/09/2019      Reactions   Penicillins    Sulfa Antibiotics       Medication List       Accurate as of September 09, 2019  8:52 AM. If you have any questions, ask your nurse or doctor.        amLODipine 5 MG tablet Commonly known as: NORVASC Take 1 tablet (5 mg total) by mouth daily.   BD Pen Needle Nano U/F 32G X 4 MM Misc Generic  drug: Insulin Pen Needle USE TO INJECT INSULIN AS DIRECTED 4 TIMES A DAY   cetirizine 10 MG tablet Commonly known as: ZYRTEC Take 1 tablet (10 mg total) by mouth daily.   FreeStyle Libre 14 Day Sensor Misc Use one sensor every 14 days to monitor blood sugars.   hydrocortisone 2.5 % cream   Lantus SoloStar 100 UNIT/ML Solostar Pen Generic drug: insulin glargine Inject into the skin 2 (two) times daily. Inject 18 units under the skin in the morning and 28 units in the evening.   NovoLOG FlexPen 100 UNIT/ML FlexPen Generic drug: insulin aspart Inject 10 Units into the skin 3 (three) times daily with meals. Inject 10 units under the skin three times daily.   olmesartan 20 MG tablet Commonly known as: BENICAR Take 1 tablet (20 mg total) by mouth daily.   ONE TOUCH ULTRA 2 w/Device Kit Use Onetouch Ultra to check blood sugar 2 times daily.   OneTouch Delica Lancets 12X Misc 1 each by Does not apply route in the morning and at bedtime. Use onetouch delica lancets to check blood sugar twice daily.   OneTouch Ultra test strip Generic drug: glucose blood Use Onetouch ultra test strips as instructed to check blood sugar 4 times daily.   pantoprazole 40 MG tablet Commonly known as: PROTONIX TAKE 1 TABLET BY MOUTH ONCE A DAY   PARoxetine 20 MG tablet Commonly known as: Paxil Take 1.5 tablets (30 mg total) by mouth daily.   pravastatin 40 MG tablet Commonly known as: PRAVACHOL TAKE 1 TABLET BY MOUTH ONCE A DAY       Allergies:  Allergies  Allergen Reactions   Penicillins    Sulfa Antibiotics     Past Medical History:  Diagnosis Date   Allergic rhinitis 05/20/2019   Anxiety    Depression    Diabetes mellitus type I ([place])    Diabetes mellitus without complication (H[facility])    Type I   Generalized anxiety disorder    GERD (gastroesophageal reflux disease)    Heart murmur    History of hepatitis C virus infection 20 years ago   She can't recall but did get  treated for HCV and it was resolved.    Hypertension    Major depressive disorder, single episode, moderate ([place])     Past Surgical History:  Procedure Laterality Date   ABDOMINAL HYSTERECTOMY     PARS PLANA VITRECTOMY Left 10/11/2014   Procedure: PARS PLANA VITRECTOMY WITH 25 GAUGE,CATARACT EXTRACTION WITH IOL INSERTION , ENDOLASER;  Surgeon: Milus Height, MD;  Location: [facility] ORS;  Service: Ophthalmology;  Laterality: Left;  [place] AP5  CDE CASETTE LOT # [id number]   SHOULDER SURGERY     TONSILLECTOMY     TONSILLECTOMY      Family History  Problem Relation Age of Onset   Thyroid disease Mother    Breast cancer Mother 79   Hypertension Mother    Depression Mother    Anxiety disorder Mother    Colon polyps Mother    Breast cancer Maternal Aunt 54   Cancer Maternal Aunt    Breast cancer Cousin 31    Social History:  reports that she has never smoked. She has never used smokeless tobacco. She reports that she does not drink alcohol and does not use drugs.  Review of Systems:  HYPERTENSION:  she has had long-standing hypertension treated with amlodipine and Benicar 20 mg  Blood pressure fairly well controlled   BP Readings from Last 3 Encounters:  09/09/19 (!) 140/64  08/16/19 (!) 153/63  06/20/19 138/60    Has history of hyperlipidemia   She is taking pravastatin 40 mg but her last LDL was high   Lab Results  Component Value Date   CHOL 198 05/09/2019   HDL 46.50 05/09/2019   LDLCALC 133 (H) 05/09/2019   LDLDIRECT 88.8 12/22/2013   TRIG 92.0 05/09/2019   CHOLHDL 4 05/09/2019     She has had mild long-standing depression controlled with Paxil and followed by psychiatrist   Eye exam 7/18 and is going for an appointment in September     Examination:   BP (!) 140/64 (BP Location: Left Arm, Patient Position: Sitting, Cuff Size: Normal)    Pulse 81    Ht 1' (0.305 m)    Wt 156 lb (70.8 kg)    HC 63" (160 cm)    SpO2 97%    BMI 761.67 kg/m   Body  mass index is 761.67 kg/m.      ASSESSMENT/ PLAN:   Diabetes type 1 on basal bolus insulin  See history of present illness for current problems identified, recent blood sugar patterns and her day-to-day management.   A1c is back up to 8%  Although she did start the freestyle libre she is not using it now for various reasons She has marked variability in her fasting blood sugars likely related to her diet at night She is still having difficulty controlling regular soft drinks at various times of the day Understand the need for carbohydrate-based insulin dosing for meals Unable to adjust her mealtime doses at various times because of lack of monitoring except in the morning  RECOMMENDATIONS:  She will check blood sugars 4 times a day  She will try to get the freestyle libre back again with calling the company number  Have sent her the link to connect with the online account for our office for future download  Also reminded her to check her fingerstick blood sugar to compare with the freestyle libre  Discussed need to check blood sugar at various times especially after dinner to help adjust her mealtime doses  Cut back on high carbohydrate and high fat snacks and meals and also food in the morning  She will cut down on the regular soft drinks  Make sure she boluses extra for significant amount of carbohydrate and snacks especially at night   no change in Lantus.  HYPERTENSION: Her blood pressure is  well-controlled Continue same medications  LIPIDS: We will recheck her lipid panel on her next visit and make sure  she is compliant with her medication   Report of the eye exam when she goes next month   There are no Patient Instructions on file for this visit.     Elayne Snare 09/09/2019, 8:52 AM

## 2019-09-09 NOTE — Patient Instructions (Signed)
Less Cokes

## 2019-09-12 ENCOUNTER — Telehealth: Payer: Self-pay | Admitting: Endocrinology

## 2019-09-12 ENCOUNTER — Other Ambulatory Visit: Payer: Self-pay | Admitting: *Deleted

## 2019-09-12 MED ORDER — NOVOLOG FLEXPEN 100 UNIT/ML ~~LOC~~ SOPN: 10.0000 [IU] | PEN_INJECTOR | Freq: Three times a day (TID) | SUBCUTANEOUS | 2 refills | Status: DC

## 2019-09-12 MED ORDER — BD PEN NEEDLE NANO U/F 32G X 4 MM MISC: 2 refills | Status: DC

## 2019-09-12 MED ORDER — OLMESARTAN MEDOXOMIL 20 MG PO TABS: 20.0000 mg | ORAL_TABLET | Freq: Every day | ORAL | 1 refills | Status: DC

## 2019-09-12 MED ORDER — LANTUS SOLOSTAR 100 UNIT/ML ~~LOC~~ SOPN: PEN_INJECTOR | SUBCUTANEOUS | 2 refills | Status: DC

## 2019-09-12 NOTE — Telephone Encounter (Signed)
Patient called re: Patient called her PHARM to pick up the following medications, however PHARM told patient the following RX's have not been received:  insulin glargine (LANTUS SOLOSTAR) 100 UNIT/ML Solostar Pen AND  insulin aspart (NOVOLOG FLEXPEN) 100 UNIT/ML FlexPen  AND  BD PEN NEEDLE NANO U/F 32G X 4 MM MISC AND  olmesartan (BENICAR) 20 MG tablet  Patient states she told Dr. Dwyane Dee that she needs every RX that can be filled be sent to:  [facility] [place] Phone:  [phone]  Fax:  [phone]     Also, patient requests to be called ph# [phone] or emailed about the thing that checks your blood (email that was sent on Friday did not work)

## 2019-09-12 NOTE — Telephone Encounter (Signed)
Refills sent to local pharmacy. Invite resent to patient for [employer].

## 2019-09-22 ENCOUNTER — Other Ambulatory Visit: Payer: Self-pay | Admitting: Nurse Practitioner

## 2019-09-22 ENCOUNTER — Other Ambulatory Visit: Payer: Self-pay | Admitting: Gastroenterology

## 2019-09-23 ENCOUNTER — Other Ambulatory Visit: Payer: Self-pay

## 2019-09-23 ENCOUNTER — Other Ambulatory Visit
Admission: RE | Admit: 2019-09-23 | Discharge: 2019-09-23 | Disposition: A | Discharge status: Home / Self Care | Payer: 59 | Source: Ambulatory Visit | Attending: Gastroenterology | Admitting: Gastroenterology

## 2019-09-23 DIAGNOSIS — Z01812 Encounter for preprocedural laboratory examination: Secondary | ICD-10-CM | POA: Diagnosis present

## 2019-09-23 DIAGNOSIS — Z20822 Contact with and (suspected) exposure to covid-19: Secondary | ICD-10-CM | POA: Insufficient documentation

## 2019-09-24 LAB — SARS CORONAVIRUS 2 (TAT 6-24 HRS): SARS Coronavirus 2: NEGATIVE

## 2019-09-26 ENCOUNTER — Encounter: Payer: Self-pay | Admitting: Gastroenterology

## 2019-09-26 ENCOUNTER — Ambulatory Visit: Payer: 59 | Admitting: Nurse Practitioner

## 2019-09-27 ENCOUNTER — Encounter
Admission: RE | Disposition: A | Discharge status: Home / Self Care | Payer: Self-pay | Source: Home / Self Care | Attending: Gastroenterology

## 2019-09-27 ENCOUNTER — Ambulatory Visit: Payer: 59 | Admitting: Anesthesiology

## 2019-09-27 ENCOUNTER — Encounter: Payer: Self-pay | Admitting: Gastroenterology

## 2019-09-27 ENCOUNTER — Ambulatory Visit
Admission: RE | Admit: 2019-09-27 | Discharge: 2019-09-27 | Disposition: A | Discharge status: Home / Self Care | Payer: 59 | Attending: Gastroenterology | Admitting: Gastroenterology

## 2019-09-27 ENCOUNTER — Other Ambulatory Visit: Payer: Self-pay

## 2019-09-27 DIAGNOSIS — Z794 Long term (current) use of insulin: Secondary | ICD-10-CM | POA: Diagnosis not present

## 2019-09-27 DIAGNOSIS — F329 Major depressive disorder, single episode, unspecified: Secondary | ICD-10-CM | POA: Diagnosis not present

## 2019-09-27 DIAGNOSIS — I10 Essential (primary) hypertension: Secondary | ICD-10-CM | POA: Insufficient documentation

## 2019-09-27 DIAGNOSIS — K219 Gastro-esophageal reflux disease without esophagitis: Secondary | ICD-10-CM | POA: Diagnosis not present

## 2019-09-27 DIAGNOSIS — E109 Type 1 diabetes mellitus without complications: Secondary | ICD-10-CM | POA: Insufficient documentation

## 2019-09-27 DIAGNOSIS — Z1211 Encounter for screening for malignant neoplasm of colon: Secondary | ICD-10-CM | POA: Insufficient documentation

## 2019-09-27 DIAGNOSIS — Z8371 Family history of colonic polyps: Secondary | ICD-10-CM | POA: Diagnosis not present

## 2019-09-27 DIAGNOSIS — Z538 Procedure and treatment not carried out for other reasons: Secondary | ICD-10-CM | POA: Diagnosis not present

## 2019-09-27 DIAGNOSIS — F411 Generalized anxiety disorder: Secondary | ICD-10-CM | POA: Insufficient documentation

## 2019-09-27 DIAGNOSIS — Z79899 Other long term (current) drug therapy: Secondary | ICD-10-CM | POA: Insufficient documentation

## 2019-09-27 HISTORY — PX: COLONOSCOPY WITH PROPOFOL: SHX5780

## 2019-09-27 LAB — GLUCOSE, CAPILLARY: Glucose-Capillary: 199 mg/dL — ABNORMAL HIGH (ref 70–99)

## 2019-09-27 SURGERY — COLONOSCOPY WITH PROPOFOL
Anesthesia: General

## 2019-09-27 MED ORDER — LIDOCAINE HCL (CARDIAC) PF 100 MG/5ML IV SOSY
PREFILLED_SYRINGE | INTRAVENOUS | Status: DC | PRN
  Administered 2019-09-27: 50 mg via INTRAVENOUS

## 2019-09-27 MED ORDER — SODIUM CHLORIDE 0.9 % IV SOLN: INTRAVENOUS | Status: DC

## 2019-09-27 MED ORDER — PROPOFOL 500 MG/50ML IV EMUL
INTRAVENOUS | Status: AC
  Filled 2019-09-27: qty 50

## 2019-09-27 MED ORDER — PROPOFOL 500 MG/50ML IV EMUL
INTRAVENOUS | Status: DC | PRN
  Administered 2019-09-27: 125 ug/kg/min via INTRAVENOUS

## 2019-09-27 MED ORDER — PROPOFOL 10 MG/ML IV BOLUS
INTRAVENOUS | Status: DC | PRN
  Administered 2019-09-27: 20 mg via INTRAVENOUS
  Administered 2019-09-27: 50 mg via INTRAVENOUS

## 2019-09-27 MED ORDER — PROPOFOL 10 MG/ML IV BOLUS
INTRAVENOUS | Status: AC
  Filled 2019-09-27: qty 20

## 2019-09-27 NOTE — Op Note (Signed)
[facility] Gastroenterology Patient Name: Diana Dixon Procedure Date: 09/27/2019 8:43 AM MRN: [id number] Account #: [id number] Date of Birth: 1960-09-02 Admit Type: Outpatient Age: 59 Room: [facility] ENDO ROOM 1 Gender: Female Note Status: Finalized Procedure:             Colonoscopy Indications:           Colon cancer screening in patient at increased risk:                         Family history of 1st-degree relative with colon polyps Providers:             Jonathon Bellows MD, MD Medicines:             Monitored Anesthesia Care Complications:         No immediate complications. Procedure:             Pre-Anesthesia Assessment:                        - Prior to the procedure, a History and Physical was                         performed, and patient medications, allergies and                         sensitivities were reviewed. The patient's tolerance                         of previous anesthesia was reviewed.                        - The risks and benefits of the procedure and the                         sedation options and risks were discussed with the                         patient. All questions were answered and informed                         consent was obtained.                        - ASA Grade Assessment: II - A patient with mild                         systemic disease.                        After obtaining informed consent, the colonoscope was                         passed under direct vision. Throughout the procedure,                         the patient's blood pressure, pulse, and oxygen                         saturations were monitored continuously. The  Colonoscope was introduced through the anus with the                         intention of advancing to the cecum. The scope was                         advanced to the sigmoid colon before the procedure was                         aborted. Medications were given. The  colonoscopy was                         performed with ease. The patient tolerated the                         procedure well. The quality of the bowel preparation                         was poor. Findings:      The perianal and digital rectal examinations were normal.      A large amount of semi-liquid stool was found in the sigmoid colon and       in the descending colon, making visualization difficult. Impression:            - Preparation of the colon was poor.                        - Stool in the sigmoid colon and in the descending                         colon.                        - No specimens collected. Recommendation:        - Discharge patient to home (with escort).                        - Resume previous diet.                        - Continue present medications.                        - Repeat colonoscopy in 4 weeks because the bowel                         preparation was suboptimal. Procedure Code(s):     --- Professional ---                        [phone], 53, Colonoscopy, flexible; diagnostic,                         including collection of specimen(s) by brushing or                         washing, when performed (separate procedure) Diagnosis Code(s):     --- Professional ---  Z83.71, Family history of colonic polyps CPT copyright 2019 American Medical Association. All rights reserved. The codes documented in this report are preliminary and upon coder review may  be revised to meet current compliance requirements. Jonathon Bellows, MD Jonathon Bellows MD, MD 09/27/2019 9:02:10 AM This report has been signed electronically. Number of Addenda: 0 Note Initiated On: 09/27/2019 8:43 AM Total Procedure Duration: 0 hours 5 minutes 38 seconds  Estimated Blood Loss:  Estimated blood loss: none.      [facility]

## 2019-09-27 NOTE — Anesthesia Preprocedure Evaluation (Signed)
Anesthesia Evaluation  Patient identified by MRN, date of birth, ID band Patient awake    Reviewed: Allergy & Precautions, NPO status , Patient's Chart, lab work & pertinent test results  History of Anesthesia Complications Negative for: history of anesthetic complications  Airway Mallampati: II       Dental   Pulmonary neg sleep apnea, neg COPD, Not current smoker,           Cardiovascular hypertension, Pt. on medications (-) Past MI and (-) CHF (-) dysrhythmias + Valvular Problems/Murmurs (murmur, no tx)      Neuro/Psych neg Seizures Anxiety Depression    GI/Hepatic GERD  Medicated and Controlled,(+) Hepatitis - (tx'd), C  Endo/Other  diabetes, Type 1, Insulin Dependent  Renal/GU      Musculoskeletal   Abdominal   Peds  Hematology   Anesthesia Other Findings   Reproductive/Obstetrics                             Anesthesia Physical Anesthesia Plan  ASA: III  Anesthesia Plan: General   Post-op Pain Management:    Induction: Intravenous  PONV Risk Score and Plan: 3 and Propofol infusion, TIVA and Treatment may vary due to age or medical condition  Airway Management Planned: Nasal Cannula  Additional Equipment:   Intra-op Plan:   Post-operative Plan:   Informed Consent: I have reviewed the patients History and Physical, chart, labs and discussed the procedure including the risks, benefits and alternatives for the proposed anesthesia with the patient or authorized representative who has indicated his/her understanding and acceptance.       Plan Discussed with:   Anesthesia Plan Comments:         Anesthesia Quick Evaluation

## 2019-09-27 NOTE — H&P (Signed)
Jonathon Bellows, MD [address] [place], [place], [place], [address] [address], [place], [place], [place], [address] Phone: [phone]  Fax: [phone]  Primary Care Physician:  Marval Regal, NP   Pre-Procedure History & Physical: HPI:  Diana Dixon is a 59 y.o. female is here for an colonoscopy.   Past Medical History:  Diagnosis Date  . Allergic rhinitis 05/20/2019  . Anxiety   . Depression   . Diabetes mellitus type I ([place])   . Diabetes mellitus without complication (HCC)    Type I  . Generalized anxiety disorder   . GERD (gastroesophageal reflux disease)   . Heart murmur   . History of hepatitis C virus infection 20 years ago   She can't recall but did get treated for HCV and it was resolved.   . Hypertension   . Major depressive disorder, single episode, moderate ([place])     Past Surgical History:  Procedure Laterality Date  . ABDOMINAL HYSTERECTOMY    . EYE SURGERY    . PARS PLANA VITRECTOMY Left 10/11/2014   Procedure: PARS PLANA VITRECTOMY WITH 25 GAUGE,CATARACT EXTRACTION WITH IOL INSERTION , ENDOLASER;  Surgeon: Milus Height, MD;  Location: [facility] ORS;  Service: Ophthalmology;  Laterality: Left;  [place] AP5  CDE CASETTE LOT # [id number] h  . SHOULDER SURGERY    . TONSILLECTOMY    . TONSILLECTOMY      Prior to Admission medications   Medication Sig Start Date End Date Taking? Authorizing Provider  amLODipine (NORVASC) 5 MG tablet Take 1 tablet (5 mg total) by mouth daily. 05/20/19  Yes Marval Regal, NP  Blood Glucose Monitoring Suppl (ONE TOUCH ULTRA 2) w/Device KIT Use Onetouch Ultra to check blood sugar 2 times daily. 03/28/19  Yes Elayne Snare, MD  cetirizine (ZYRTEC) 10 MG tablet TAKE 1 TABLET BY MOUTH ONCE DAILY 09/23/19  Yes Marval Regal, NP  Continuous Blood Gluc Sensor (FREESTYLE LIBRE 14 DAY SENSOR) MISC Use one sensor every 14 days to monitor blood sugars. 05/09/19  Yes Elayne Snare, MD  glucose blood (ONETOUCH ULTRA) test strip Use  Onetouch ultra test strips as instructed to check blood sugar 4 times daily. 05/09/19  Yes Elayne Snare, MD  hydrocortisone 2.5 % cream  05/16/19  Yes [provider]  insulin aspart (NOVOLOG FLEXPEN) 100 UNIT/ML FlexPen Inject 10 Units into the skin 3 (three) times daily with meals. Inject 10 units under the skin three times daily. 09/12/19  Yes Elayne Snare, MD  insulin glargine (LANTUS SOLOSTAR) 100 UNIT/ML Solostar Pen Inject 18 units under the skin in the morning and 28 units in the evening. 09/12/19  Yes Elayne Snare, MD  Insulin Pen Needle (BD PEN NEEDLE NANO U/F) 32G X 4 MM MISC USE TO INJECT INSULIN AS DIRECTED 4 TIMES A DAY 09/12/19  Yes Elayne Snare, MD  olmesartan (BENICAR) 20 MG tablet Take 1 tablet (20 mg total) by mouth daily. 09/12/19  Yes Elayne Snare, MD  OneTouch Delica Lancets 89Q MISC 1 each by Does not apply route in the morning and at bedtime. Use onetouch delica lancets to check blood sugar twice daily. 03/28/19  Yes Elayne Snare, MD  pantoprazole (PROTONIX) 40 MG tablet TAKE 1 TABLET BY MOUTH ONCE A DAY 11/22/17  Yes Elayne Snare, MD  PARoxetine (PAXIL) 20 MG tablet Take 1.5 tablets (30 mg total) by mouth daily. 05/20/19  Yes Marval Regal, NP  polyethylene glycol (GAVILYTE-G) 236 g solution Drink 8 oz every  20-30 minutes until entire prep is finished. 09/23/19  Yes Jonathon Bellows, MD  pravastatin (PRAVACHOL) 40 MG tablet TAKE 1 TABLET BY MOUTH ONCE A DAY 06/22/18  Yes Elayne Snare, MD    Allergies as of 08/17/2019 - Review Complete 08/16/2019  Allergen Reaction Noted  . Penicillins  09/17/2012  . Sulfa antibiotics  09/17/2012    Family History  Problem Relation Age of Onset  . Thyroid disease Mother   . Breast cancer Mother 53  . Hypertension Mother   . Depression Mother   . Anxiety disorder Mother   . Colon polyps Mother   . Breast cancer Maternal Aunt 32  . Cancer Maternal Aunt   . Breast cancer Cousin 95    Social History   Socioeconomic History  . Marital status:  Single    Spouse name: Not on file  . Number of children: 1  . Years of education: Not on file  . Highest education level: Some college, no degree  Occupational History    Comment: part time  Tobacco Use  . Smoking status: Never Smoker  . Smokeless tobacco: Never Used  Vaping Use  . Vaping Use: Never used  Substance and Sexual Activity  . Alcohol use: No    Alcohol/week: 0.0 standard drinks  . Drug use: No  . Sexual activity: Not Currently    Partners: Male  Other Topics Concern  . Not on file  Social History Narrative   Regular exercise: walk   Caffeine use: sodas   Social Determinants of Health   Financial Resource Strain:   . Difficulty of Paying Living Expenses:   Food Insecurity:   . Worried About Charity fundraiser in the Last Year:   . Arboriculturist in the Last Year:   Transportation Needs:   . Film/video editor (Medical):   Marland Kitchen Lack of Transportation (Non-Medical):   Physical Activity:   . Days of Exercise per Week:   . Minutes of Exercise per Session:   Stress:   . Feeling of Stress :   Social Connections:   . Frequency of Communication with Friends and Family:   . Frequency of Social Gatherings with Friends and Family:   . Attends Religious Services:   . Active Member of Clubs or Organizations:   . Attends Archivist Meetings:   Marland Kitchen Marital Status:   Intimate Partner Violence:   . Fear of Current or Ex-Partner:   . Emotionally Abused:   Marland Kitchen Physically Abused:   . Sexually Abused:     Review of Systems: See HPI, otherwise negative ROS  Physical Exam: BP (!) 148/52   Pulse 84   Temp 97.6 F (36.4 C) (Temporal)   Resp 16   Ht 5' (1.524 m)   Wt 70.9 kg   SpO2 100%   BMI 30.53 kg/m  General:   Alert,  pleasant and cooperative in NAD Head:  Normocephalic and atraumatic. Neck:  Supple; no masses or thyromegaly. Lungs:  Clear throughout to auscultation, normal respiratory effort.    Heart:  +S1, +S2, Regular rate and rhythm, No  edema. Abdomen:  Soft, nontender and nondistended. Normal bowel sounds, without guarding, and without rebound.   Neurologic:  Alert and  oriented x4;  grossly normal neurologically.  Impression/Plan: ALLYSSIA SKLUZACEK is here for an colonoscopy to be performed for colon cancer screening- family history of colon polyps in mother. Risks, benefits, limitations, and alternatives regarding  colonoscopy have been reviewed with the patient.  Questions have been answered.  All parties agreeable.   Jonathon Bellows, MD  09/27/2019, 8:43 AM

## 2019-09-27 NOTE — Transfer of Care (Signed)
Immediate Anesthesia Transfer of Care Note  Patient: Scarlette Ar  Procedure(s) Performed: COLONOSCOPY WITH PROPOFOL (N/A )  Patient Location: PACU and Endoscopy Unit  Anesthesia Type:General  Level of Consciousness: drowsy  Airway & Oxygen Therapy: Patient Spontanous Breathing  Post-op Assessment: Report given to RN and Post -op Vital signs reviewed and stable  Post vital signs: Reviewed and stable  Last Vitals:  Vitals Value Taken Time  BP    Temp    Pulse    Resp    SpO2      Last Pain:  Vitals:   09/27/19 0814  TempSrc: Temporal         Complications: No complications documented.

## 2019-09-27 NOTE — Anesthesia Postprocedure Evaluation (Signed)
Anesthesia Post Note  Patient: Diana Dixon  Procedure(s) Performed: COLONOSCOPY WITH PROPOFOL (N/A )  Patient location during evaluation: Endoscopy Anesthesia Type: General Level of consciousness: awake and alert Pain management: pain level controlled Vital Signs Assessment: post-procedure vital signs reviewed and stable Respiratory status: spontaneous breathing and respiratory function stable Cardiovascular status: stable Anesthetic complications: no   No complications documented.   Last Vitals:  Vitals:   09/27/19 0923 09/27/19 0933  BP: 91/61 (!) 125/57  Pulse: 70   Resp: 20   Temp:    SpO2: 98%     Last Pain:  Vitals:   09/27/19 0933  TempSrc:   PainSc: 0-No pain                 Elzie Sheets K

## 2019-09-28 ENCOUNTER — Encounter: Payer: Self-pay | Admitting: Gastroenterology

## 2019-09-30 ENCOUNTER — Telehealth: Payer: Self-pay | Admitting: Gastroenterology

## 2019-09-30 NOTE — Telephone Encounter (Signed)
Patient states her procedure on 8.17.21 was incomplete and wants to know what to do now. Pt states insurance will not cover another procedure, and wants to know what Dr. Vicente Males suggests.

## 2019-10-04 ENCOUNTER — Telehealth: Payer: 59 | Admitting: Gastroenterology

## 2019-10-04 NOTE — Telephone Encounter (Signed)
Personally I am not sure what else I can do.  I do not have any authority over the charges of the procedure since it is all handled by the hospital.  Can we check with [place] to see if anything else can be done?  Do we know when the insurance will eventually cover it?

## 2019-10-18 ENCOUNTER — Other Ambulatory Visit: Payer: Self-pay | Admitting: Endocrinology

## 2019-10-31 ENCOUNTER — Ambulatory Visit (INDEPENDENT_AMBULATORY_CARE_PROVIDER_SITE_OTHER): Payer: 59 | Admitting: Gastroenterology

## 2019-10-31 ENCOUNTER — Telehealth: Payer: Self-pay | Admitting: Gastroenterology

## 2019-10-31 ENCOUNTER — Other Ambulatory Visit: Payer: Self-pay

## 2019-10-31 DIAGNOSIS — Z23 Encounter for immunization: Secondary | ICD-10-CM | POA: Diagnosis not present

## 2019-10-31 NOTE — Telephone Encounter (Signed)
Patient here with her mother, wanting to know if she needs to get another Hep B shot. Last shot was 7.6.2 Please advise.

## 2019-11-07 ENCOUNTER — Other Ambulatory Visit: Payer: Self-pay | Admitting: Endocrinology

## 2019-11-08 ENCOUNTER — Telehealth: Payer: Self-pay

## 2019-11-08 MED ORDER — NOVOLOG FLEXPEN 100 UNIT/ML ~~LOC~~ SOPN: PEN_INJECTOR | SUBCUTANEOUS | 2 refills | Status: DC

## 2019-11-08 NOTE — Telephone Encounter (Signed)
New message   The patient is saying the dosage needs to be changed to 11-14 units.   1. Which medications need to be refilled? (please list name of each medication and dose if known) NOVOLOG FLEXPEN 100 UNIT/ML FlexPen   2. Which pharmacy/location (including street and city if local pharmacy) is medication to be sent to?[facility]

## 2019-11-08 NOTE — Telephone Encounter (Signed)
Dose has been changed and patient has been notified

## 2019-11-08 NOTE — Telephone Encounter (Signed)
We can make the change requested

## 2019-11-08 NOTE — Telephone Encounter (Signed)
Patient states that the Novolog should be 11-14 units. Please advise if this change is correct. It was sent for 8-10 units

## 2019-12-16 ENCOUNTER — Ambulatory Visit: Payer: 59 | Admitting: Endocrinology

## 2019-12-25 ENCOUNTER — Telehealth: Payer: Self-pay | Admitting: Nurse Practitioner

## 2019-12-25 DIAGNOSIS — R748 Abnormal levels of other serum enzymes: Secondary | ICD-10-CM

## 2019-12-25 DIAGNOSIS — E785 Hyperlipidemia, unspecified: Secondary | ICD-10-CM

## 2019-12-25 DIAGNOSIS — I10 Essential (primary) hypertension: Secondary | ICD-10-CM

## 2019-12-25 DIAGNOSIS — K760 Fatty (change of) liver, not elsewhere classified: Secondary | ICD-10-CM

## 2019-12-25 NOTE — Telephone Encounter (Addendum)
She is in need of repeat Bmet for her olmesartan -to monitor her kidney function and OV to follow HTN and HLD. And a follow up OV.   PLAN: She needs labs- has been 7 mos and we should do labs at least every 6 months on an ARB.   She needs an OV with me and I can order labs. She can't come in and agrees to get  labs on  11/24 /21 when she sees Dr. Dwyane Dee for her DM and we can tack on BMet, hepatic panel,  Lipids.

## 2019-12-27 ENCOUNTER — Other Ambulatory Visit: Payer: Self-pay | Admitting: Endocrinology

## 2019-12-27 DIAGNOSIS — E7800 Pure hypercholesterolemia, unspecified: Secondary | ICD-10-CM

## 2019-12-27 DIAGNOSIS — E1065 Type 1 diabetes mellitus with hyperglycemia: Secondary | ICD-10-CM

## 2019-12-27 NOTE — Telephone Encounter (Signed)
Spoke with patient about below message and she can not take a lot of time off right now with the holidays right around the corner. She said she would like to have her labs done with Dr. Dwyane Dee if they can be added to have drawn then.

## 2020-01-04 ENCOUNTER — Other Ambulatory Visit: Payer: Self-pay

## 2020-01-04 ENCOUNTER — Encounter: Payer: Self-pay | Admitting: Endocrinology

## 2020-01-04 ENCOUNTER — Ambulatory Visit (INDEPENDENT_AMBULATORY_CARE_PROVIDER_SITE_OTHER): Payer: 59 | Admitting: Endocrinology

## 2020-01-04 VITALS — BP 120/58 | HR 80 | Ht 62.0 in | Wt 152.0 lb

## 2020-01-04 DIAGNOSIS — I10 Essential (primary) hypertension: Secondary | ICD-10-CM

## 2020-01-04 DIAGNOSIS — E10649 Type 1 diabetes mellitus with hypoglycemia without coma: Secondary | ICD-10-CM | POA: Diagnosis not present

## 2020-01-04 DIAGNOSIS — E7800 Pure hypercholesterolemia, unspecified: Secondary | ICD-10-CM

## 2020-01-04 DIAGNOSIS — E1065 Type 1 diabetes mellitus with hyperglycemia: Secondary | ICD-10-CM

## 2020-01-04 LAB — COMPREHENSIVE METABOLIC PANEL
ALT: 28 U/L (ref 0–35)
AST: 29 U/L (ref 0–37)
Albumin: 4.4 g/dL (ref 3.5–5.2)
Alkaline Phosphatase: 101 U/L (ref 39–117)
BUN: 15 mg/dL (ref 6–23)
CO2: 30 mEq/L (ref 19–32)
Calcium: 9.7 mg/dL (ref 8.4–10.5)
Chloride: 103 mEq/L (ref 96–112)
Creatinine, Ser: 0.96 mg/dL (ref 0.40–1.20)
GFR: 64.98 mL/min (ref >60.00)
Glucose, Bld: 196 mg/dL — ABNORMAL HIGH (ref 70–99)
Potassium: 4.1 mEq/L (ref 3.5–5.1)
Sodium: 139 mEq/L (ref 135–145)
Total Bilirubin: 0.5 mg/dL (ref 0.2–1.2)
Total Protein: 7 g/dL (ref 6.0–8.3)

## 2020-01-04 LAB — LIPID PANEL
Cholesterol: 141 mg/dL (ref 0–200)
HDL: 53.8 mg/dL (ref >39.00)
LDL Cholesterol: 74 mg/dL (ref 0–99)
NonHDL: 87.18
Total CHOL/HDL Ratio: 3
Triglycerides: 68 mg/dL (ref 0.0–149.0)
VLDL: 13.6 mg/dL (ref 0.0–40.0)

## 2020-01-04 LAB — TSH: TSH: 0.92 u[IU]/mL (ref 0.35–4.50)

## 2020-01-04 LAB — POCT GLYCOSYLATED HEMOGLOBIN (HGB A1C): Hemoglobin A1C: 6.8 % — AB (ref 4.0–5.6)

## 2020-01-04 NOTE — Progress Notes (Signed)
Patient ID: Diana Dixon, female   DOB: 12-11-1960, 59 y.o.   MRN: [id number]   Reason for Appointment:  follow-up   History of Present Illness   Diagnosis: Type 1 DIABETES MELITUS, diagnosis made in 1967      She has had long-standing type 1 diabetes with persistently poor control because of variability in blood sugars and difficulty with various self-care issues She has been reluctant to do carbohydrate counting to adjust her insulin doses and has variable readings after meals especially lunch Previously has had times when she would forget her Lantus in the evening and also mealtime dose She may have done a little better with taking Lantus twice a day but does not try to adjust the doses on her own    Recent history:  Insulin regimen: Lantus 18---28, Novolog 7-8 units at breakfast and 9-10 units at lunch and supper  She has had persistently poor control of her diabetes despite frequently advising her on insulin changes and better compliance for day-to-day management of diet, insulin regimen and glucose monitoring  A1c today is lower than expected at 6.8 compared to 8.0  Current blood sugar patterns and problems identified:  She did use the freestyle libre again about a month ago but it only lasted for a week at a time and unable to download her readings today  Not clear what her blood sugar patterns are after meals as she is mostly checking blood sugars in the mornings and some at lunch  Also has unexpected variability in her blood sugars as before  However she says that she is adjusting her LANTUS up or down between 20-28 units in the evening based on her blood sugar level at that time  She also will have occasional low blood sugar around midday based on her activity level and insulin dose  She still continues to drink regular soft drinks at different meals  She is calculating her NOVOLOG somewhat arbitrarily, 7 to 8 units in the morning and 8 to 10 units at  other meals  She thinks she is adjusting her NovoLog based on her premeal reading but frequently not checking readings before dinner  She is trying to do some walking regularly  Weight is down about 4 pounds   DIET: May have unbalanced meals like Pop tarts, waffles or breakfast cereal.  May have high fat meats at meals.   Is drinking regular soft drinks and sweet tea at meals, less at breakfast    Glucometer: One Touch ultra 2        Blood Glucose readings from download:    PRE-MEAL Fasting Lunch Dinner  3 AM Overall  Glucose range:  75-314  53-24  111-369  365   Mean/median:  140  145    160+/-101   POST-MEAL PC Breakfast PC Lunch PC Dinner  Glucose range:    93  Mean/median:       PREVIOUS blood sugar range 112-404 with average about 199 a.m. Late morning 158-104 Midday 200-271   Meals: 3 meals per day.  Eating eggs/meat for breakfast frequently without carbohydrate; dinner 5-6 pm           Wt Readings from Last 3 Encounters:  01/04/20 152 lb (68.9 kg)  09/27/19 156 lb 4.9 oz (70.9 kg)  09/09/19 156 lb (70.8 kg)     Lab Results  Component Value Date   HGBA1C 6.8 (A) 01/04/2020   HGBA1C 8.0 (A) 09/09/2019   HGBA1C 7.6 (A) 05/09/2019  Lab Results  Component Value Date   MICROALBUR 1.1 05/09/2019   LDLCALC 74 01/04/2020   CREATININE 0.96 01/04/2020    Lab Results  Component Value Date   MICROALBUR 1.1 05/09/2019    Allergies as of 01/04/2020      Reactions   Penicillins    Sulfa Antibiotics       Medication List       Accurate as of January 04, 2020 11:59 PM. If you have any questions, ask your nurse or doctor.        amLODipine 5 MG tablet Commonly known as: NORVASC Take 1 tablet (5 mg total) by mouth daily.   BD Pen Needle Nano U/F 32G X 4 MM Misc Generic drug: Insulin Pen Needle USE TO INJECT INSULIN AS DIRECTED 4 TIMES A DAY   cetirizine 10 MG tablet Commonly known as: ZYRTEC TAKE 1 TABLET BY MOUTH ONCE DAILY   FreeStyle Libre  14 Day Sensor Misc Use one sensor every 14 days to monitor blood sugars.   GaviLyte-G 236 g solution Generic drug: polyethylene glycol Drink 8 oz every 20-30 minutes until entire prep is finished.   hydrocortisone 2.5 % cream   Lantus SoloStar 100 UNIT/ML Solostar Pen Generic drug: insulin glargine Inject 18 units under the skin in the morning and 28 units in the evening.   NovoLOG FlexPen 100 UNIT/ML FlexPen Generic drug: insulin aspart INJECT 11-14 UNITS INTO SKIN BEFORE MEALS 3 TIMES DAILY   olmesartan 20 MG tablet Commonly known as: BENICAR Take 1 tablet (20 mg total) by mouth daily.   ONE TOUCH ULTRA 2 w/Device Kit Use Onetouch Ultra to check blood sugar 2 times daily.   OneTouch Delica Lancets 84O Misc 1 each by Does not apply route in the morning and at bedtime. Use onetouch delica lancets to check blood sugar twice daily.   OneTouch Ultra test strip Generic drug: glucose blood Use Onetouch ultra test strips as instructed to check blood sugar 4 times daily.   pantoprazole 40 MG tablet Commonly known as: PROTONIX TAKE 1 TABLET BY MOUTH ONCE A DAY   PARoxetine 20 MG tablet Commonly known as: Paxil Take 1.5 tablets (30 mg total) by mouth daily.   pravastatin 40 MG tablet Commonly known as: PRAVACHOL TAKE 1 TABLET BY MOUTH NIGHTLY       Allergies:  Allergies  Allergen Reactions  . Penicillins   . Sulfa Antibiotics     Past Medical History:  Diagnosis Date  . Allergic rhinitis 05/20/2019  . Anxiety   . Depression   . Diabetes mellitus type I ([place])   . Diabetes mellitus without complication (HCC)    Type I  . Generalized anxiety disorder   . GERD (gastroesophageal reflux disease)   . Heart murmur   . History of hepatitis C virus infection 20 years ago   She can't recall but did get treated for HCV and it was resolved.   . Hypertension   . Major depressive disorder, single episode, moderate ([place])     Past Surgical History:  Procedure Laterality  Date  . ABDOMINAL HYSTERECTOMY    . COLONOSCOPY WITH PROPOFOL N/A 09/27/2019   Procedure: COLONOSCOPY WITH PROPOFOL;  Surgeon: Jonathon Bellows, MD;  Location[facility] ENDOSCOPY;  Service: Gastroenterology;  Laterality: N/A;  . EYE SURGERY    . PARS PLANA VITRECTOMY Left 10/11/2014   Procedure: PARS PLANA VITRECTOMY WITH 25 GAUGE,CATARACT EXTRACTION WITH IOL INSERTION , ENDOLASER;  Surgeon: Milus Height, MD;  Location: [facility] ORS;  Service:  Ophthalmology;  Laterality: Left;  [place] AP5  CDE CASETTE LOT # [id number] h  . SHOULDER SURGERY    . TONSILLECTOMY    . TONSILLECTOMY      Family History  Problem Relation Age of Onset  . Thyroid disease Mother   . Breast cancer Mother 79  . Hypertension Mother   . Depression Mother   . Anxiety disorder Mother   . Colon polyps Mother   . Breast cancer Maternal Aunt 76  . Cancer Maternal Aunt   . Breast cancer Cousin 90    Social History:  reports that she has never smoked. She has never used smokeless tobacco. She reports that she does not drink alcohol and does not use drugs.  Review of Systems:  HYPERTENSION:  she has had long-standing hypertension treated with amlodipine and Benicar 20 mg  Blood pressure well controlled   BP Readings from Last 3 Encounters:  01/04/20 (!) 120/58  09/27/19 (!) 125/57  09/09/19 (!) 140/64    Has history of hyperlipidemia   She is taking pravastatin 40 mg but her last LDL was high   Lab Results  Component Value Date   CHOL 141 01/04/2020   HDL 53.80 01/04/2020   LDLCALC 74 01/04/2020   LDLDIRECT 88.8 12/22/2013   TRIG 68.0 01/04/2020   CHOLHDL 3 01/04/2020     She has had mild long-standing depression controlled with Paxil and followed by psychiatrist   Eye exam 7/18 and is going for an appointment in 2/22     Examination:   BP (!) 120/58   Pulse 80   Ht 5' 2"  (1.575 m)   Wt 152 lb (68.9 kg)   SpO2 96%   BMI 27.80 kg/m   Body mass index is 27.8 kg/m.      ASSESSMENT/ PLAN:   Diabetes  type 1 on basal bolus insulin  See history of present illness for detailed discussion of current diabetes management, blood sugar patterns and problems identified   A1c is lower than usual at 6.8, previously was up to 8%  Her blood sugars may have improved with using her CGM which she is not currently using Without access to her CGM data not clear what her blood sugar patterns have been However she is likely doing better with her diet also since her weight is coming down She may be also modifying her diet based on previous CGM patterns  Her morning sugars are somewhat more stable also  RECOMMENDATIONS:  Showed her how to get the freestyle app linked to our account online  She will try to use this consistently and call the 800-number if she has any difficulties with sensor malfunction  Discussed keeping the dose of Lantus especially evening dose steady at 28 U unless morning sugars are out of line and stop adjusting the dose based on evening Premeal blood sugar  She will try to use the calorie [employer] Pace app for carbohydrate counting  Discussed that if she can try empirically using 1: 5 carbohydrate coverage along with correction for higher doses she may have better coverage of meals  Needs to have a late morning snack especially if more active to avoid low sugars midday especially if she is only eating cereal in the morning  Needs to regularly check sugars after dinner  She was recommended consultation with dietitian but she is reluctant to do so  HYPERTENSION: Her blood pressure is  well-controlled Labs to be checked  LIPIDS: We will recheck her lipid panel today since previously  her lipids were not controlled  Keep appointment for eye exam in 2/22   Patient Instructions  Take Novolog before eating  Calorie Edison Pace or my Fitness pal for Carb counting  Divide carbs by 5 for Novolog          Elayne Snare 01/05/2020, 2:24 PM    Labs: All ok

## 2020-01-04 NOTE — Patient Instructions (Signed)
Take Novolog before eating  Calorie Edison Pace or my Fitness pal for Carb counting  Divide carbs by 5 for [employer]

## 2020-01-05 ENCOUNTER — Encounter: Payer: Self-pay | Admitting: Endocrinology

## 2020-01-05 NOTE — Progress Notes (Signed)
Please call to let patient know that the lab results are normal except glucose

## 2020-01-16 ENCOUNTER — Other Ambulatory Visit: Payer: Self-pay | Admitting: *Deleted

## 2020-01-16 ENCOUNTER — Other Ambulatory Visit: Payer: Self-pay | Admitting: Endocrinology

## 2020-01-16 MED ORDER — BD PEN NEEDLE NANO U/F 32G X 4 MM MISC: 2 refills | Status: DC

## 2020-01-23 ENCOUNTER — Other Ambulatory Visit: Payer: Self-pay | Admitting: Endocrinology

## 2020-01-24 ENCOUNTER — Other Ambulatory Visit: Payer: Self-pay | Admitting: *Deleted

## 2020-01-24 NOTE — Telephone Encounter (Signed)
Patient called and said that no PCP and that Dr Dwyane Dee is the prescribing provider for Protonix and Zytrec. Call back number is [phone]

## 2020-01-25 ENCOUNTER — Telehealth: Payer: Self-pay

## 2020-01-25 MED ORDER — CETIRIZINE HCL 10 MG PO TABS: 10.0000 mg | ORAL_TABLET | Freq: Every day | ORAL | 3 refills | Status: DC

## 2020-01-25 MED ORDER — PAROXETINE HCL 20 MG PO TABS: 30.0000 mg | ORAL_TABLET | Freq: Every day | ORAL | 1 refills | Status: DC

## 2020-01-25 MED ORDER — PANTOPRAZOLE SODIUM 40 MG PO TBEC: 40.0000 mg | DELAYED_RELEASE_TABLET | Freq: Every day | ORAL | 1 refills | Status: DC

## 2020-01-25 NOTE — Telephone Encounter (Signed)
She also need Paxil.

## 2020-01-25 NOTE — Telephone Encounter (Signed)
Pt states that she got an appt with [facility] for Jan 2022. She needs refills of pantoprazole (PROTONIX) 40 MG tablet and cetirizine (ZYRTEC) 10 MG tablet

## 2020-02-22 LAB — HM DIABETES EYE EXAM

## 2020-02-23 ENCOUNTER — Other Ambulatory Visit: Payer: Self-pay | Admitting: Endocrinology

## 2020-02-23 ENCOUNTER — Telehealth: Payer: Self-pay | Admitting: Endocrinology

## 2020-02-23 MED ORDER — FREESTYLE LITE TEST VI STRP
ORAL_STRIP | 3 refills | Status: DC
Start: 2020-02-23 — End: 2020-02-24

## 2020-02-23 MED ORDER — FREESTYLE LITE W/DEVICE KIT: 1.0000 | PACK | Freq: Two times a day (BID) | 0 refills | Status: DC

## 2020-02-23 NOTE — Telephone Encounter (Signed)
REFILL REQUEST:  Patients insurance no longer covers One Touch supplies, can we please call in a Freestyle Lite meter and test strips?  [place], [place] [address]  [place], [place] [address]  Phone:  [phone] Fax:  [phone]

## 2020-02-24 MED ORDER — MICROLET LANCETS MISC: 3 refills | Status: DC

## 2020-02-24 MED ORDER — FREESTYLE LITE DEVI: 1 refills | Status: DC

## 2020-02-24 MED ORDER — FREESTYLE LITE TEST VI STRP
ORAL_STRIP | 12 refills | Status: DC
Start: 2020-02-24 — End: 2021-02-28

## 2020-02-28 ENCOUNTER — Encounter: Payer: Self-pay | Admitting: *Deleted

## 2020-03-09 ENCOUNTER — Other Ambulatory Visit: Payer: Self-pay | Admitting: Endocrinology

## 2020-03-09 ENCOUNTER — Telehealth: Payer: Self-pay | Admitting: Endocrinology

## 2020-03-09 ENCOUNTER — Other Ambulatory Visit: Payer: Self-pay | Admitting: *Deleted

## 2020-03-09 MED ORDER — NOVOLOG FLEXPEN 100 UNIT/ML ~~LOC~~ SOPN: PEN_INJECTOR | SUBCUTANEOUS | 2 refills | Status: DC

## 2020-03-09 NOTE — Telephone Encounter (Signed)
Diana Dixon with [facility] request a new Rx with changes as follows:   RX for Novolog be written with new directions: with current dose with the addition that patient can take up to 50 units per day-1 month supply=15 ml's-for insurance purposes  Be sent to  [place] [place] Phone:  [phone]  Fax:  [phone]

## 2020-03-09 NOTE — Telephone Encounter (Signed)
Rx re-sent as requested.

## 2020-03-16 ENCOUNTER — Other Ambulatory Visit: Payer: Self-pay | Admitting: General Surgery

## 2020-03-16 NOTE — Progress Notes (Signed)
ubjective:     Patient ID: Diana Dixon is a 60 y.o. female.  HPI  The following portions of the patient's history were reviewed and updated as appropriate.  This an established patient is here today for: office visit. Here for colonoscopy discussion.  The patient had a colonoscopy in August 2021 with an incomplete exam due to a large amount of retained liquid stool.  The study at that time was being completed as screening because of a past history of colon polyps.  This was before her mother's diagnosis with colon cancer.   She states her bowels move every other day. She admits to what she thinks is a hemorrhoid that itches and burns occasionally.  Bleeding only when traumatized by scratching.  Review of Systems  Constitutional: Negative for chills and fever.  Respiratory: Negative for cough.   Gastrointestinal: Negative for constipation and diarrhea.       Chief Complaint  Patient presents with  . New Patient     BP 138/64   Pulse 86   Temp 36.5 C (97.7 F)   Ht 157.5 cm (5\' 2" )   Wt 68.5 kg (151 lb)   SpO2 97%   BMI 27.62 kg/m   Past Medical History:  Diagnosis Date  . Allergic rhinitis   . Anxiety   . Cardiac murmur   . Depression   . Diabetes mellitus type 2, uncomplicated (CMS-HCC)   . Endometriosis of uterus   . Fibroid   . GERD (gastroesophageal reflux disease)   . Hemorrhoids   . Hepatitis   . History of hepatitis C virus infection   . Hyperlipidemia   . Hypertension   . LGSIL Pap smear of vagina           Past Surgical History:  Procedure Laterality Date  . CESAREAN SECTION    . COLONOSCOPY  09/27/2019   prior was 2013  . COLPOSCOPY  2016  . HYSTERECTOMY     TAH-BSO  . shoulder surgery    . TONSILLECTOMY    . VITRECTOMY MECHANICAL PARS PLANA Left 10/11/2014              OB History    Gravida  1   Para  1   Term  1   Preterm      AB      Living  1     SAB      IAB      Ectopic       Molar      Multiple      Live Births  1       Obstetric Comments  Age at first period 9 Age of first pregnancy 34         Social History          Socioeconomic History  . Marital status: Widowed    Spouse name: Not on file  . Number of children: Not on file  . Years of education: Not on file  . Highest education level: Not on file  Occupational History  . Not on file  Tobacco Use  . Smoking status: Never Smoker  . Smokeless tobacco: Never Used  Substance and Sexual Activity  . Alcohol use: No  . Drug use: No  . Sexual activity: Yes    Birth control/protection: Surgical  Other Topics Concern  . Would you please tell us about the people who live in your home, your pets, or anything else important to your social life? Not Asked  Social History Narrative  . Not on file   Social Determinants of Health   Financial Resource Strain: Not on file  Food Insecurity: Not on file  Transportation Needs: Not on file           Allergies  Allergen Reactions  . Penicillin G Unknown  . Sulfa (Sulfonamide Antibiotics) Unknown    Current Medications        Current Outpatient Medications  Medication Sig Dispense Refill  . amLODIPine (NORVASC) 5 MG tablet Take by mouth    . amLODIPine-olmesartan (AZOR) 5-20 mg tablet Take 1 tablet by mouth once daily.    . Compound Medication Estriol 1 mg/g Insert 1/4 app per vagina twice weekly  Disp 30 g tube with 2 rf Called to medicap 1 each 2  . hydrocortisone 2.5 % cream APPLY TOPICALLY TO AFFECTED AREA TWICE DAILY AS DIRECTED 28 g 1  . insulin ASPART PROTAMINE-ASPART (NOVOLOG MIX FLEXPEN 70/30) 100 unit/mL (70-30) pen injector Inject subcutaneously 2 (two) times daily before meals.    . insulin GLARGINE (LANTUS SOLOSTAR) 100 unit/mL pen injector Inject subcutaneously nightly.    . olmesartan (BENICAR) 20 MG tablet Take by mouth    . PARoxetine (PAXIL-CR) 12.5 MG CR tablet Take by mouth.    .  phentermine (ADIPEX-P) 15 MG capsule Take 15 mg by mouth every morning before breakfast    . pravastatin (PRAVACHOL) 40 MG tablet Take 40 mg by mouth nightly    . valACYclovir (VALTREX) 500 MG tablet TAKE 1 TABLET BY MOUTH TWICE (2) DAILY 6 tablet 6  . metoclopramide (REGLAN) 5 MG tablet Take 1 tablet (5 mg total) by mouth 4 (four) times daily for 8 doses 8 tablet 0  . polyethylene glycol (MIRALAX) powder One bottle for colonoscopy prep. Use as directed. 255 g 0   No current facility-administered medications for this visit.           Family History  Problem Relation Age of Onset  . Thyroid disease Mother   . High blood pressure (Hypertension) Mother   . Breast cancer Mother   . Depression Mother   . Anxiety Mother   . Colon polyps Mother   . Breast cancer Maternal Aunt   . Breast cancer Cousin      Labs and Radiology:   August 2021 colonoscopy images and report reviewed.  Large amount of liquid stool precluded passage above the level of the descending colon.   Laboratory January 04, 2020: Hemoglobin A1C 4.0 - 5.6 % 6.8Abnormal    Sodium 135 - 145 mEq/L 139   Potassium 3.5 - 5.1 mEq/L 4.1   Chloride 96 - 112 mEq/L 103   CO2 19 - 32 mEq/L 30   Glucose, Bld 70 - 99 mg/dL 196High   BUN 6 - 23 mg/dL 15   Creatinine, Ser 0.40 - 1.20 mg/dL 0.96   Total Bilirubin 0.2 - 1.2 mg/dL 0.5   Alkaline Phosphatase 39 - 117 U/L 101   AST 0 - 37 U/L 29   ALT 0 - 35 U/L 28   Total Protein 6.0 - 8.3 g/dL 7.0   Albumin 3.5 - 5.2 g/dL 4.4   GFR >60.00 mL/min 64.98   Comment: Calculated using the CKD-EPI Creatinine Equation (2021)  Calcium 8.4 - 10.5 mg/dL 9.7         Objective:   Physical Exam Exam conducted with a chaperone present.  Constitutional:      Appearance: Normal appearance.  Cardiovascular:     Rate  and Rhythm: Normal rate and regular rhythm.     Pulses: Normal pulses.     Heart sounds: Normal heart sounds.  Pulmonary:     Effort:  Pulmonary effort is normal.     Breath sounds: Normal breath sounds.  Genitourinary:      Comments: Photo in media section. Musculoskeletal:     Cervical back: Neck supple.  Skin:    General: Skin is warm and dry.  Neurological:     Mental Status: She is alert and oriented to person, place, and time.  Psychiatric:        Mood and Affect: Mood normal.        Behavior: Behavior normal.   Anoscopy: No evidence of internal or external hemorrhoids.  No fissures or fistula.       Assessment:     Perineal skin irritation.  Likely secondary to moisture.  Family history of colon cancer with incomplete exam in August 2021.  Longstanding insulin-dependent diabetes, possibly slow colonic transit.     Plan:      The patient will make use of a 2-day prep as well as make use of Reglan 5 mg AC/nightly for 2 days prior to the procedure.  She will make use of citrate of magnesia 2 days prior to the procedure, and MiraLAX 1 day prior to the procedure.  She will decrease Lantus insulin by 50% morning and evening the day of preparation and the procedure.    Colonoscopy to be arranged.  The patient was instructed to avoid soap on the perineal area.  Carefully drying, cool hair dryer after towel drying and application of a thin film of the hydrocortisone cream provided by her GYN on a twice daily basis.  Cotton underwear.  Avoid scratching the area.  Entered by Karie Fetch, RN, acting as a scribe for Dr. Hervey Ard, MD.  The documentation recorded by the scribe accurately reflects the service I personally performed and the decisions made by me.   Robert Bellow, MD FACS

## 2020-03-28 ENCOUNTER — Other Ambulatory Visit: Payer: Self-pay

## 2020-03-28 ENCOUNTER — Other Ambulatory Visit
Admission: RE | Admit: 2020-03-28 | Discharge: 2020-03-28 | Disposition: A | Discharge status: Home / Self Care | Payer: 59 | Source: Ambulatory Visit | Attending: General Surgery | Admitting: General Surgery

## 2020-03-28 DIAGNOSIS — Z20822 Contact with and (suspected) exposure to covid-19: Secondary | ICD-10-CM | POA: Insufficient documentation

## 2020-03-28 DIAGNOSIS — Z01812 Encounter for preprocedural laboratory examination: Secondary | ICD-10-CM | POA: Insufficient documentation

## 2020-03-28 LAB — SARS CORONAVIRUS 2 (TAT 6-24 HRS): SARS Coronavirus 2: NEGATIVE

## 2020-03-29 ENCOUNTER — Encounter: Payer: Self-pay | Admitting: General Surgery

## 2020-03-30 ENCOUNTER — Ambulatory Visit
Admission: RE | Admit: 2020-03-30 | Discharge: 2020-03-30 | Disposition: A | Discharge status: Home / Self Care | Payer: 59 | Attending: General Surgery | Admitting: General Surgery

## 2020-03-30 ENCOUNTER — Other Ambulatory Visit: Payer: Self-pay

## 2020-03-30 ENCOUNTER — Ambulatory Visit: Payer: 59 | Admitting: Registered Nurse

## 2020-03-30 ENCOUNTER — Encounter: Payer: Self-pay | Admitting: General Surgery

## 2020-03-30 ENCOUNTER — Encounter
Admission: RE | Disposition: A | Discharge status: Home / Self Care | Payer: Self-pay | Source: Home / Self Care | Attending: General Surgery

## 2020-03-30 DIAGNOSIS — Z90710 Acquired absence of both cervix and uterus: Secondary | ICD-10-CM | POA: Insufficient documentation

## 2020-03-30 DIAGNOSIS — D123 Benign neoplasm of transverse colon: Secondary | ICD-10-CM | POA: Insufficient documentation

## 2020-03-30 DIAGNOSIS — Z79899 Other long term (current) drug therapy: Secondary | ICD-10-CM | POA: Insufficient documentation

## 2020-03-30 DIAGNOSIS — D122 Benign neoplasm of ascending colon: Secondary | ICD-10-CM | POA: Diagnosis not present

## 2020-03-30 DIAGNOSIS — I10 Essential (primary) hypertension: Secondary | ICD-10-CM | POA: Insufficient documentation

## 2020-03-30 DIAGNOSIS — Z882 Allergy status to sulfonamides status: Secondary | ICD-10-CM | POA: Insufficient documentation

## 2020-03-30 DIAGNOSIS — Z794 Long term (current) use of insulin: Secondary | ICD-10-CM | POA: Diagnosis not present

## 2020-03-30 DIAGNOSIS — Z880 Allergy status to penicillin: Secondary | ICD-10-CM | POA: Diagnosis not present

## 2020-03-30 DIAGNOSIS — Z1211 Encounter for screening for malignant neoplasm of colon: Secondary | ICD-10-CM | POA: Insufficient documentation

## 2020-03-30 DIAGNOSIS — E109 Type 1 diabetes mellitus without complications: Secondary | ICD-10-CM | POA: Diagnosis not present

## 2020-03-30 DIAGNOSIS — Z800 Family history of malignant neoplasm of digestive organs: Secondary | ICD-10-CM | POA: Diagnosis not present

## 2020-03-30 DIAGNOSIS — Z8619 Personal history of other infectious and parasitic diseases: Secondary | ICD-10-CM | POA: Diagnosis not present

## 2020-03-30 HISTORY — PX: COLONOSCOPY WITH PROPOFOL: SHX5780

## 2020-03-30 HISTORY — DX: Benign neoplasm of connective and other soft tissue, unspecified: D21.9

## 2020-03-30 HISTORY — DX: Endometriosis, unspecified: N80.9

## 2020-03-30 LAB — GLUCOSE, CAPILLARY: Glucose-Capillary: 156 mg/dL — ABNORMAL HIGH (ref 70–99)

## 2020-03-30 SURGERY — COLONOSCOPY WITH PROPOFOL
Anesthesia: General

## 2020-03-30 MED ORDER — PHENYLEPHRINE HCL (PRESSORS) 10 MG/ML IV SOLN
INTRAVENOUS | Status: DC | PRN
  Administered 2020-03-30: 100 ug via INTRAVENOUS

## 2020-03-30 MED ORDER — SODIUM CHLORIDE 0.9 % IV SOLN: INTRAVENOUS | Status: DC | PRN

## 2020-03-30 MED ORDER — PROPOFOL 10 MG/ML IV BOLUS
INTRAVENOUS | Status: DC | PRN
  Administered 2020-03-30: 70 mg via INTRAVENOUS

## 2020-03-30 MED ORDER — SODIUM CHLORIDE 0.9 % IV SOLN: INTRAVENOUS | Status: DC

## 2020-03-30 MED ORDER — EPHEDRINE SULFATE 50 MG/ML IJ SOLN
INTRAMUSCULAR | Status: DC | PRN
  Administered 2020-03-30 (×2): 5 mg via INTRAVENOUS

## 2020-03-30 MED ORDER — PROPOFOL 500 MG/50ML IV EMUL
INTRAVENOUS | Status: DC | PRN
  Administered 2020-03-30: 140 ug/kg/min via INTRAVENOUS

## 2020-03-30 NOTE — H&P (Signed)
KHRISTIAN SEALS [id number] 1960/09/30     HPI:  60 y/o woman with an incomplete colonoscopy in 2021 and recent diagnosis of colon cancer in her mother. For repeat exam. Reports tolerating the prep well.   Medications Prior to Admission  Medication Sig Dispense Refill Last Dose  . amLODipine (NORVASC) 5 MG tablet Take 1 tablet (5 mg total) by mouth daily. 90 tablet 3 03/30/2020 at 0630  . cetirizine (ZYRTEC) 10 MG tablet Take 1 tablet (10 mg total) by mouth daily. 90 tablet 3 03/29/2020 at 2100  . hydrocortisone 2.5 % cream    03/29/2020 at 2000  . Insulin Pen Needle (BD PEN NEEDLE NANO U/F) 32G X 4 MM MISC USE TO INJECT INSULIN AS DIRECTED 4 TIMES A DAY 200 each 2 03/29/2020 at 2000  . LANTUS SOLOSTAR 100 UNIT/ML Solostar Pen INJECT 18 UNITS UNDER THE SKIN IN THE MORNING AND 28 UNITS IN THE EVENING. 15 mL 2 03/29/2020 at 2000  . metoCLOPramide (REGLAN) 5 MG tablet Take 5 mg by mouth 4 (four) times daily.   03/30/2020 at 0630  . olmesartan (BENICAR) 20 MG tablet Take 1 tablet (20 mg total) by mouth daily. 90 tablet 1 03/30/2020 at 0630  . pantoprazole (PROTONIX) 40 MG tablet Take 1 tablet (40 mg total) by mouth daily. 90 tablet 1 Past Week at Unknown time  . PARoxetine (PAXIL) 20 MG tablet Take 1.5 tablets (30 mg total) by mouth daily. 90 tablet 1 03/29/2020 at 2100  . pravastatin (PRAVACHOL) 40 MG tablet TAKE 1 TABLET BY MOUTH NIGHTLY 90 tablet 3 03/29/2020 at 2115  . Blood Glucose Monitoring Suppl (FREESTYLE LITE) DEVI Use 1-4 times daily as needed DX EE10.649 1 each 1   . Blood Glucose Monitoring Suppl (FREESTYLE LITE) w/Device KIT 1 each by Does not apply route in the morning and at bedtime. 1 kit 0   . Continuous Blood Gluc Sensor (FREESTYLE LIBRE 14 DAY SENSOR) MISC Use one sensor every 14 days to monitor blood sugars. 2 each 3   . FREESTYLE LITE test strip USE TO CHECK BLOOD SUGAR TWICE DAILY 200 each 3   . glucose blood (FREESTYLE LITE) test strip Use 1-4 times daily as needed DX E10.649 100  each 12   . insulin aspart (NOVOLOG FLEXPEN) 100 UNIT/ML FlexPen INJECT 11 TO 14 UNITS UNDER THE SKIN THREE TIMES DAILY  Can take up to 50 units per day 15 mL 2   . Microlet Lancets MISC Use 1-4 times daily as needed DX EE10.649 300 each 3   . phentermine 15 MG capsule Take 15 mg by mouth every morning. (Patient not taking: Reported on 03/30/2020)   Not Taking at Unknown time  . polyethylene glycol (GAVILYTE-G) 236 g solution Drink 8 oz every 20-30 minutes until entire prep is finished. 4000 mL 0   . valACYclovir (VALTREX) 500 MG tablet Take 500 mg by mouth 2 (two) times daily. (Patient not taking: Reported on 03/30/2020)   Completed Course at Unknown time   Allergies  Allergen Reactions  . Penicillins   . Sulfa Antibiotics    Past Medical History:  Diagnosis Date  . Allergic rhinitis 05/20/2019  . Anxiety   . Depression   . Diabetes mellitus type I ([place])   . Diabetes mellitus without complication (HCC)    Type I  . Endometriosis   . Fibroid   . Generalized anxiety disorder   . GERD (gastroesophageal reflux disease)   . Heart murmur   . History of  hepatitis C virus infection 20 years ago   She can't recall but did get treated for HCV and it was resolved.   . Hypertension   . Major depressive disorder, single episode, moderate ([place])    Past Surgical History:  Procedure Laterality Date  . ABDOMINAL HYSTERECTOMY    . COLONOSCOPY WITH PROPOFOL N/A 09/27/2019   Procedure: COLONOSCOPY WITH PROPOFOL;  Surgeon: Jonathon Bellows, MD;  [facility] END[facility]COPY;  Service: Gastroenterology;  Laterality: N/A;  . EYE SURGERY    . PARS PLANA VITRECTOMY Left 10/11/2014   Procedure: PARS PLANA VITRECTOMY WITH 25 GAUGE,CATARACT EXTRACTION WITH IOL INSERTION , ENDOLASER;  Surgeon: Milus Height, MD;  Location: [facility] ORS;  Service: Ophthalmology;  Laterality: Left;  [place] AP5  CDE CASETTE LOT # [id number]  . SHOULDER SURGERY    . TONSILLECTOMY    . TONSILLECTOMY     Social History   Socioeconomic  History  . Marital status: Single    Spouse name: Not on file  . Number of children: 1  . Years of education: Not on file  . Highest education level: Some college, no degree  Occupational History    Comment: part time  Tobacco Use  . Smoking status: Never Smoker  . Smokeless tobacco: Never Used  Vaping Use  . Vaping Use: Never used  Substance and Sexual Activity  . Alcohol use: No    Alcohol/week: 0.0 standard drinks  . Drug use: No  . Sexual activity: Not Currently    Partners: Male  Other Topics Concern  . Not on file  Social History Narrative   Regular exercise: walk   Caffeine use: sodas   Social Determinants of Health   Financial Resource Strain: Not on file  Food Insecurity: Not on file  Transportation Needs: Not on file  Physical Activity: Not on file  Stress: Not on file  Social Connections: Not on file  Intimate Partner Violence: Not on file   Social History   Social History Narrative   Regular exercise: walk   Caffeine use: sodas     ROS: Negative.     PE: HEENT: Negative. Lungs: Clear. Cardio: RR.    Assessment/Plan:  Proceed with planned endoscopy.   Forest Gleason [facility] 03/30/2020

## 2020-03-30 NOTE — Transfer of Care (Signed)
Immediate Anesthesia Transfer of Care Note  Patient: Diana Dixon  Procedure(s) Performed: COLONOSCOPY WITH PROPOFOL (N/A )  Patient Location: PACU  Anesthesia Type:General  Level of Consciousness: sedated  Airway & Oxygen Therapy: Patient Spontanous Breathing  Post-op Assessment: Report given to RN and Post -op Vital signs reviewed and stable  Post vital signs: Reviewed and stable  Last Vitals:  Vitals Value Taken Time  BP 114/52 03/30/20 0846  Temp 36.4 C 03/30/20 0845  Pulse 75 03/30/20 0848  Resp 14 03/30/20 0848  SpO2 98 % 03/30/20 0848  Vitals shown include unvalidated device data.  Last Pain:  Vitals:   03/30/20 0845  TempSrc: Temporal  PainSc: Asleep         Complications: No complications documented.

## 2020-03-30 NOTE — Anesthesia Preprocedure Evaluation (Signed)
Anesthesia Evaluation  Patient identified by MRN, date of birth, ID band Patient awake    Reviewed: Allergy & Precautions, H&P , NPO status , Patient's Chart, lab work & pertinent test results, reviewed documented beta blocker date and time   Airway Mallampati: II   Neck ROM: full    Dental  (+) Teeth Intact   Pulmonary neg pulmonary ROS,    Pulmonary exam normal        Cardiovascular Exercise Tolerance: Good hypertension, On Medications Normal cardiovascular exam+ Valvular Problems/Murmurs  Rhythm:regular Rate:Normal     Neuro/Psych PSYCHIATRIC DISORDERS Anxiety Depression negative neurological ROS     GI/Hepatic GERD  Medicated,(+) Hepatitis -  Endo/Other  negative endocrine ROSdiabetes, Type 1, Insulin Dependent  Renal/GU negative Renal ROS  negative genitourinary   Musculoskeletal   Abdominal   Peds  Hematology negative hematology ROS (+)   Anesthesia Other Findings Past Medical History: 05/20/2019: Allergic rhinitis No date: Anxiety No date: Depression No date: Diabetes mellitus type I ([place]) No date: Diabetes mellitus without complication (HCC)     Comment:  Type I No date: Endometriosis No date: Fibroid No date: Generalized anxiety disorder No date: GERD (gastroesophageal reflux disease) No date: Heart murmur 20 years ago: History of hepatitis C virus infection     Comment:  She can't recall but did get treated for HCV and it was               resolved.  No date: Hypertension No date: Major depressive disorder, single episode, moderate ([place]) Past Surgical History: No date: ABDOMINAL HYSTERECTOMY 09/27/2019: COLONOSCOPY WITH PROPOFOL; N/A     Comment:  Procedure: COLONOSCOPY WITH PROPOFOL;  Surgeon: Jonathon Bellows, MD;  Location: [facility] ENDOSCOPY;  Service:               Gastroenterology;  Laterality: N/A; No date: EYE SURGERY 10/11/2014: PARS PLANA VITRECTOMY; Left     Comment:  Procedure:  PARS PLANA VITRECTOMY WITH 25 GAUGE,CATARACT               EXTRACTION WITH IOL INSERTION , ENDOLASER;  Surgeon:               Milus Height, MD;  Location: [facility] ORS;  Service:               Ophthalmology;  Laterality: Left;  [place] AP5  CDE CASETTE              LOT # [id number] No date: SHOULDER SURGERY No date: TONSILLECTOMY No date: TONSILLECTOMY BMI    Body Mass Index: 27.62 kg/m     Reproductive/Obstetrics negative OB ROS                             Anesthesia Physical Anesthesia Plan  ASA: III  Anesthesia Plan: General   Post-op Pain Management:    Induction:   PONV Risk Score and Plan:   Airway Management Planned:   Additional Equipment:   Intra-op Plan:   Post-operative Plan:   Informed Consent: I have reviewed the patients History and Physical, chart, labs and discussed the procedure including the risks, benefits and alternatives for the proposed anesthesia with the patient or authorized representative who has indicated his/her understanding and acceptance.     Dental Advisory Given  Plan Discussed with: CRNA  Anesthesia Plan Comments:  Anesthesia Quick Evaluation  

## 2020-03-30 NOTE — Op Note (Signed)
[facility] Gastroenterology Patient Name: Diana Dixon Procedure Date: 03/30/2020 8:12 AM MRN: [id number] Account #: [id number] Date of Birth: [birth year] Admit Type: Outpatient Age: 60 Room: [facility] ENDO ROOM 1 Gender: Female Note Status: Finalized Procedure:             Colonoscopy Indications:           Family history colon cancer Providers:             Robert Bellow, MD Referring MD:          Janalyn Harder. Jerelene Redden, NP (Referring MD) Medicines:             Monitored Anesthesia Care Complications:         No immediate complications. Procedure:             Pre-Anesthesia Assessment:                        - Prior to the procedure, a History and Physical was                         performed, and patient medications, allergies and                         sensitivities were reviewed. The patient's tolerance                         of previous anesthesia was reviewed.                        - The risks and benefits of the procedure and the                         sedation options and risks were discussed with the                         patient. All questions were answered and informed                         consent was obtained.                        After obtaining informed consent, the colonoscope was                         passed under direct vision. Throughout the procedure,                         the patient's blood pressure, pulse, and oxygen                         saturations were monitored continuously. The                         Colonoscope was introduced through the anus and                         advanced to the the terminal ileum. The colonoscopy                         was performed without  difficulty. The patient                         tolerated the procedure well. The quality of the bowel                         preparation was excellent. Findings:      A 5 mm polyp was found in the ascending colon. The polyp was sessile.       Biopsies  were taken with a cold forceps for histology.      A 9 mm polyp was found in the hepatic flexure. The polyp was       semi-pedunculated. The polyp was removed with a hot snare. Resection and       retrieval were complete.      The retroflexed view of the distal rectum and anal verge was normal and       showed no anal or rectal abnormalities. Impression:            - One 5 mm polyp in the ascending colon. Biopsied.                        - One 9 mm polyp at the hepatic flexure, removed with                         a hot snare. Resected and retrieved.                        - The distal rectum and anal verge are normal on                         retroflexion view. Recommendation:        - Telephone endoscopist for pathology results in 1                         week. Procedure Code(s):     --- Professional ---                        [phone], Colonoscopy, flexible; with removal of                         tumor(s), polyp(s), or other lesion(s) by snare                         technique                        45380, 75, Colonoscopy, flexible; with biopsy, single                         or multiple Diagnosis Code(s):     --- Professional ---                        K63.5, Polyp of colon CPT copyright 2019 American Medical Association. All rights reserved. The codes documented in this report are preliminary and upon coder review may  be revised to meet current compliance requirements. Robert Bellow, MD 03/30/2020 8:46:32 AM This report has been signed electronically. Number of Addenda: 0 Note Initiated On: 03/30/2020 8:12 AM Scope Withdrawal  Time: 0 hours 14 minutes 17 seconds  Total Procedure Duration: 0 hours 23 minutes 28 seconds  Estimated Blood Loss:  Estimated blood loss: none.      [facility]

## 2020-04-01 NOTE — Anesthesia Postprocedure Evaluation (Signed)
Anesthesia Post Note  Patient: Scarlette Ar  Procedure(s) Performed: COLONOSCOPY WITH PROPOFOL (N/A )  Patient location during evaluation: PACU Anesthesia Type: General Level of consciousness: awake and alert Pain management: pain level controlled Vital Signs Assessment: post-procedure vital signs reviewed and stable Respiratory status: spontaneous breathing, nonlabored ventilation, respiratory function stable and patient connected to nasal cannula oxygen Cardiovascular status: blood pressure returned to baseline and stable Postop Assessment: no apparent nausea or vomiting Anesthetic complications: no   No complications documented.   Last Vitals:  Vitals:   03/30/20 0855 03/30/20 0915  BP: (!) 117/51 (!) 127/49  Pulse:    Resp:    Temp:    SpO2:      Last Pain:  Vitals:   03/30/20 0915  TempSrc:   PainSc: 0-No pain                 Molli Barrows

## 2020-04-02 ENCOUNTER — Encounter: Payer: Self-pay | Admitting: General Surgery

## 2020-04-02 LAB — SURGICAL PATHOLOGY

## 2020-04-27 ENCOUNTER — Other Ambulatory Visit: Payer: Self-pay | Admitting: Obstetrics and Gynecology

## 2020-04-27 DIAGNOSIS — Z1231 Encounter for screening mammogram for malignant neoplasm of breast: Secondary | ICD-10-CM

## 2020-05-08 ENCOUNTER — Ambulatory Visit: Payer: 59 | Admitting: Endocrinology

## 2020-05-17 ENCOUNTER — Ambulatory Visit
Admission: RE | Admit: 2020-05-17 | Discharge: 2020-05-17 | Disposition: A | Discharge status: Home / Self Care | Payer: 59 | Source: Ambulatory Visit | Attending: Obstetrics and Gynecology | Admitting: Obstetrics and Gynecology

## 2020-05-17 ENCOUNTER — Other Ambulatory Visit: Payer: Self-pay

## 2020-05-17 DIAGNOSIS — Z1231 Encounter for screening mammogram for malignant neoplasm of breast: Secondary | ICD-10-CM | POA: Diagnosis present

## 2020-05-29 ENCOUNTER — Other Ambulatory Visit: Payer: Self-pay

## 2020-05-29 ENCOUNTER — Encounter: Payer: Self-pay | Admitting: Endocrinology

## 2020-05-29 ENCOUNTER — Ambulatory Visit (INDEPENDENT_AMBULATORY_CARE_PROVIDER_SITE_OTHER): Payer: 59 | Admitting: Endocrinology

## 2020-05-29 ENCOUNTER — Other Ambulatory Visit: Payer: Self-pay | Admitting: Endocrinology

## 2020-05-29 VITALS — BP 122/52 | HR 88 | Ht 62.0 in | Wt 152.6 lb

## 2020-05-29 DIAGNOSIS — E10649 Type 1 diabetes mellitus with hypoglycemia without coma: Secondary | ICD-10-CM

## 2020-05-29 DIAGNOSIS — I10 Essential (primary) hypertension: Secondary | ICD-10-CM

## 2020-05-29 LAB — POCT GLYCOSYLATED HEMOGLOBIN (HGB A1C): Hemoglobin A1C: 7.3 % — AB (ref 4.0–5.6)

## 2020-05-29 LAB — MICROALBUMIN / CREATININE URINE RATIO
Creatinine,U: 129.2 mg/dL
Microalb Creat Ratio: 1.6 mg/g (ref 0.0–30.0)
Microalb, Ur: 2.1 mg/dL — ABNORMAL HIGH (ref 0.0–1.9)

## 2020-05-29 MED ORDER — OLMESARTAN MEDOXOMIL 20 MG PO TABS: 20.0000 mg | ORAL_TABLET | Freq: Every day | ORAL | 1 refills | Status: DC

## 2020-05-29 MED ORDER — FREESTYLE LIBRE 2 SENSOR MISC: 2.0000 | 3 refills | Status: DC

## 2020-05-29 MED ORDER — TOUJEO SOLOSTAR 300 UNIT/ML ~~LOC~~ SOPN: PEN_INJECTOR | SUBCUTANEOUS | 3 refills | Status: DC

## 2020-05-29 MED ORDER — AMLODIPINE BESYLATE 5 MG PO TABS: 5.0000 mg | ORAL_TABLET | Freq: Every day | ORAL | 1 refills | Status: DC

## 2020-05-29 NOTE — Progress Notes (Signed)
Patient ID: Diana Dixon, female   DOB: 1960/03/01, 60 y.o.   MRN: [id number]   Reason for Appointment:  follow-up   History of Present Illness   Diagnosis: Type 1 DIABETES MELITUS, diagnosis made in 1967      She has had long-standing type 1 diabetes with persistently poor control because of variability in blood sugars and difficulty with various self-care issues She has been reluctant to do carbohydrate counting to adjust her insulin doses and has variable readings after meals especially lunch Previously has had times when she would forget her Lantus in the evening and also mealtime dose She may have done a little better with taking Lantus twice a day but does not try to adjust the doses on her own    Recent history:  Insulin regimen: Lantus 18---28, Novolog 7-8 units at breakfast and 9-10 units at lunch and supper   A1c today is 7.3, was 6.8   Current blood sugar patterns and problems identified:  She did go back to the freestyle libre because of high out-of-pocket expense  She is now using a FreeStyle lite meter although the time does not appear to be programmed properly  She is only checking her blood sugars in the mornings usually and only rarely later in the day  As before her FASTING blood sugars are fluctuating although I am an average are about 150  She is taking a stable dose of Lantus and not adjusting it based on her meals in the evening  However her NOVOLOG is not being adjusted based on her carbohydrate intake and meal size  Last evening she had spaghetti but her blood sugar this morning was 330  Blood sugars after breakfast may be relatively higher at times but not consistently  She is mostly eating cereal at breakfast  HYPOGLYCEMIA has occurred twice with blood sugars in the 40s, once on waking up in 1 around noon  Weight is about the same  She said that she is generally physically active at work   DIET: May have unbalanced meals like  Pop tarts, waffles or breakfast cereal.  May have high fat meats at meals.   Is drinking regular soft drinks and sweet tea at meals, less at breakfast    Glucometer: Freestyle lite        Blood Glucose readings from download:    PRE-MEAL Fasting Lunch Dinner Bedtime Overall  Glucose range:  45-394  46-395  500    Mean/median:  160     188   POST-MEAL PC Breakfast PC Lunch PC Dinner  Glucose range:   ?  Mean/median:       Previously:  PRE-MEAL Fasting Lunch Dinner  3 AM Overall  Glucose range:  75-314  53-24  111-369  365   Mean/median:  140  145    160+/-101   POST-MEAL PC Breakfast PC Lunch PC Dinner  Glucose range:    93  Mean/median:        Meals: 3 meals per day.  Eating eggs/meat for breakfast frequently without carbohydrate; dinner 5-6 pm           Wt Readings from Last 3 Encounters:  05/29/20 152 lb 9.6 oz (69.2 kg)  03/30/20 151 lb (68.5 kg)  01/04/20 152 lb (68.9 kg)     Lab Results  Component Value Date   HGBA1C 7.3 (A) 05/29/2020   HGBA1C 6.8 (A) 01/04/2020   HGBA1C 8.0 (A) 09/09/2019   Lab Results  Component  Value Date   MICROALBUR 2.1 (H) 05/29/2020   LDLCALC 74 01/04/2020   CREATININE 0.96 01/04/2020    Lab Results  Component Value Date   MICROALBUR 2.1 (H) 05/29/2020    Allergies as of 05/29/2020      Reactions   Penicillins    Sulfa Antibiotics       Medication List       Accurate as of May 29, 2020  8:42 PM. If you have any questions, ask your nurse or doctor.        amLODipine 5 MG tablet Commonly known as: NORVASC Take 1 tablet (5 mg total) by mouth daily.   BD Pen Needle Nano U/F 32G X 4 MM Misc Generic drug: Insulin Pen Needle USE TO INJECT INSULIN AS DIRECTED 4 TIMES A DAY   cetirizine 10 MG tablet Commonly known as: ZYRTEC Take 1 tablet (10 mg total) by mouth daily.   FreeStyle Libre 2 Sensor Misc 2 Devices by Does not apply route every 14 (fourteen) days. What changed:   how much to take  how to take  this  when to take this  additional instructions Changed by: Elayne Snare, MD   FreeStyle Libre 2 Sensor Misc 2 Devices by Does not apply route every 14 (fourteen) days. What changed: You were already taking a medication with the same name, and this prescription was added. Make sure you understand how and when to take each. Changed by: Elayne Snare, MD   FREESTYLE LITE test strip Generic drug: glucose blood USE TO CHECK BLOOD SUGAR TWICE DAILY   FREESTYLE LITE test strip Generic drug: glucose blood Use 1-4 times daily as needed DX E10.649   FreeStyle Lite w/Device Kit 1 each by Does not apply route in the morning and at bedtime.   FreeStyle [employer] Use 1-4 times daily as needed DX EE10.649   hydrocortisone 2.5 % cream   Lantus SoloStar 100 UNIT/ML Solostar Pen Generic drug: insulin glargine INJECT 18 UNITS UNDER THE SKIN IN THE MORNING AND 28 UNITS IN THE EVENING.   metoCLOPramide 5 MG tablet Commonly known as: REGLAN Take 5 mg by mouth 4 (four) times daily.   Microlet Lancets Misc Use 1-4 times daily as needed DX EE10.649   NovoLOG FlexPen 100 UNIT/ML FlexPen Generic drug: insulin aspart INJECT 11 TO 14 UNITS UNDER THE SKIN THREE TIMES DAILY  Can take up to 50 units per day   olmesartan 20 MG tablet Commonly known as: BENICAR Take 1 tablet (20 mg total) by mouth daily.   pantoprazole 40 MG tablet Commonly known as: PROTONIX Take 1 tablet (40 mg total) by mouth daily.   PARoxetine 20 MG tablet Commonly known as: Paxil Take 1.5 tablets (30 mg total) by mouth daily.   phentermine 15 MG capsule Take 15 mg by mouth every morning.   pravastatin 40 MG tablet Commonly known as: PRAVACHOL TAKE 1 TABLET BY MOUTH NIGHTLY   valACYclovir 500 MG tablet Commonly known as: VALTREX Take 500 mg by mouth 2 (two) times daily.       Allergies:  Allergies  Allergen Reactions  . Penicillins   . Sulfa Antibiotics     Past Medical History:  Diagnosis Date  .  Allergic rhinitis 05/20/2019  . Anxiety   . Depression   . Diabetes mellitus type I ([place])   . Diabetes mellitus without complication (HCC)    Type I  . Endometriosis   . Fibroid   . Generalized anxiety disorder   .  GERD (gastroesophageal reflux disease)   . Heart murmur   . History of hepatitis C virus infection 20 years ago   She can't recall but did get treated for HCV and it was resolved.   . Hypertension   . Major depressive disorder, single episode, moderate ([place])     Past Surgical History:  Procedure Laterality Date  . ABDOMINAL HYSTERECTOMY    . COLONOSCOPY WITH PROPOFOL N/A 09/27/2019   Procedure: COLONOSCOPY WITH PROPOFOL;  Surgeon: Jonathon Bellows, MD;  [facility]COPY;  Service: Gastroenterology;  Laterality: N/A;  . COLONOSCOPY WITH PROPOFOL N/A 03/30/2020   Procedure: COLONOSCOPY WITH PROPOFOL;  Surgeon: Robert Bellow, MD;  Location: [facility] ENDOSCOPY;  Service: Endoscopy;  Laterality: N/A;  . EYE SURGERY    . PARS PLANA VITRECTOMY Left 10/11/2014   Procedure: PARS PLANA VITRECTOMY WITH 25 GAUGE,CATARACT EXTRACTION WITH IOL INSERTION , ENDOLASER;  Surgeon: Milus Height, MD;  Location: [facility] ORS;  Service: Ophthalmology;  Laterality: Left;  [place] AP5  CDE CASETTE LOT # [id number] h  . SHOULDER SURGERY    . TONSILLECTOMY    . TONSILLECTOMY      Family History  Problem Relation Age of Onset  . Thyroid disease Mother   . Breast cancer Mother 29  . Hypertension Mother   . Depression Mother   . Anxiety disorder Mother   . Colon polyps Mother   . Breast cancer Maternal Aunt 67  . Cancer Maternal Aunt   . Breast cancer Cousin 55       maternal side    Social History:  reports that she has never smoked. She has never used smokeless tobacco. She reports that she does not drink alcohol and does not use drugs.  Review of Systems:  HYPERTENSION:  she has had long-standing hypertension treated with amlodipine and Benicar 20 mg  Blood pressure well controlled   BP  Readings from Last 3 Encounters:  05/29/20 (!) 122/52  03/30/20 (!) 127/49  01/04/20 (!) 120/58    Has history of hyperlipidemia   She is taking pravastatin 40 mg recently with good control   Lab Results  Component Value Date   CHOL 141 01/04/2020   HDL 53.80 01/04/2020   LDLCALC 74 01/04/2020   LDLDIRECT 88.8 12/22/2013   TRIG 68.0 01/04/2020   CHOLHDL 3 01/04/2020    She has had mild long-standing depression controlled with Paxil and followed by psychiatrist   Eye exam 1/22     Examination:   BP (!) 122/52   Pulse 88   Ht _0  (1.575 m)   Wt 152 lb 9.6 oz (69.2 kg)   SpO2 95%   BMI 27.91 kg/m   Body mass index is 27.91 kg/m.     ASSESSMENT/ PLAN:   Diabetes type 1 on basal bolus insulin  See history of present illness for detailed discussion of current diabetes management, blood sugar patterns and problems identified   A1c is 7.3  Her blood sugars previously may have improved with using freestyle libre but she is not able to get this because of high out-of-pocket expense Blood sugars are not monitored consistently with mostly only fasting readings Has significant lability of her blood sugars including hypoglycemia Also may have high readings after breakfast from eating cereal   RECOMMENDATIONS:  Discussed need for more frequent glucose monitoring to help assess her mealtime insulin adjustment  She will see if the freestyle Elenor Legato is covered and we will send another prescription including to [facility]  Adjust the NovoLog  based on portion size and carbohydrate intake especially at dinnertime along with targeting postprandial reading of under 180    HYPERTENSION: Her blood pressure is  well-controlled Continue same medications  To have microalbumin checked  LIPIDS: Controlled on pravastatin  Will recheck her lipid panel today since previously her lipids were not controlled   Given her list of PCPs to establish with  Patient Instructions   Toujeo insulin add 2 units compared to Lantus  Check blood sugars on waking up 5 days a week  Also check blood sugars about 2 hours after meals and do this after different meals by rotation  Recommended blood sugar levels on waking up are 90-130 and about 2 hours after meal is 130-180  Please bring your blood sugar monitor to each visit, thank you  Adjust Novolog based on amount of carbs at each meal      Elayne Snare 05/29/2020, 8:42 PM    Labs: All ok

## 2020-05-29 NOTE — Patient Instructions (Addendum)
Toujeo insulin add 2 units compared to Lantus  Check blood sugars on waking up 5 days a week  Also check blood sugars about 2 hours after meals and do this after different meals by rotation  Recommended blood sugar levels on waking up are 90-130 and about 2 hours after meal is 130-180  Please bring your blood sugar monitor to each visit, thank you  Adjust Novolog based on amount of carbs at each meal

## 2020-05-30 ENCOUNTER — Other Ambulatory Visit: Payer: Self-pay | Admitting: *Deleted

## 2020-05-30 ENCOUNTER — Telehealth: Payer: Self-pay | Admitting: Endocrinology

## 2020-05-30 MED ORDER — NOVOLOG FLEXPEN 100 UNIT/ML ~~LOC~~ SOPN: PEN_INJECTOR | SUBCUTANEOUS | 11 refills | Status: DC

## 2020-05-30 MED ORDER — PRAVASTATIN SODIUM 40 MG PO TABS: 40.0000 mg | ORAL_TABLET | Freq: Every day | ORAL | 3 refills | Status: DC

## 2020-05-30 MED ORDER — PANTOPRAZOLE SODIUM 40 MG PO TBEC: 40.0000 mg | DELAYED_RELEASE_TABLET | Freq: Every day | ORAL | 3 refills | Status: AC

## 2020-05-30 MED ORDER — PAROXETINE HCL 20 MG PO TABS: 30.0000 mg | ORAL_TABLET | Freq: Every day | ORAL | 1 refills | Status: DC

## 2020-05-30 MED ORDER — LANTUS SOLOSTAR 100 UNIT/ML ~~LOC~~ SOPN: PEN_INJECTOR | SUBCUTANEOUS | 11 refills | Status: DC

## 2020-05-30 NOTE — Telephone Encounter (Signed)
Rxs sent

## 2020-05-30 NOTE — Telephone Encounter (Signed)
She can go back to Lantus.  May refill medications except Zyrtec which is OTC.  Have already refilled her olmesartan and amlodipine

## 2020-05-30 NOTE — Telephone Encounter (Signed)
Pt calling in states that the insulin glargine, 1 Unit Dial, (TOUJEO SOLOSTAR) 300 UNIT/ML Solostar Pen she can not afford and states that she needs the lantus back. Pt states that Provider voiced that he would refill pcp medication.   Also needs   amLODipine (NORVASC) 5 MG tablet olmesartan (BENICAR) 20 MG tablet insulin aspart (NOVOLOG FLEXPEN) 100 UNIT/ML FlexPen cetirizine (ZYRTEC) 10 MG tablet pravastatin (PRAVACHOL) 40 MG tablet PARoxetine (PAXIL) 20 MG tablet  [facility] - [place], [place] - [place]

## 2020-07-31 ENCOUNTER — Other Ambulatory Visit: Payer: Self-pay | Admitting: Endocrinology

## 2020-07-31 NOTE — Telephone Encounter (Signed)
Pt is needing a refill for PARoxetine (PAXIL) 20 MG tablet  [facility] - [place], [place] - [place]

## 2020-08-01 ENCOUNTER — Other Ambulatory Visit: Payer: Self-pay

## 2020-08-01 DIAGNOSIS — E1065 Type 1 diabetes mellitus with hyperglycemia: Secondary | ICD-10-CM

## 2020-08-01 MED ORDER — BD PEN NEEDLE NANO U/F 32G X 4 MM MISC: 2 refills | Status: DC

## 2020-09-12 ENCOUNTER — Ambulatory Visit
Admission: EM | Admit: 2020-09-12 | Discharge: 2020-09-12 | Disposition: A | Discharge status: Home / Self Care | Payer: 59

## 2020-09-12 ENCOUNTER — Other Ambulatory Visit: Payer: Self-pay

## 2020-09-12 DIAGNOSIS — H9202 Otalgia, left ear: Secondary | ICD-10-CM | POA: Diagnosis not present

## 2020-09-12 DIAGNOSIS — H6982 Other specified disorders of Eustachian tube, left ear: Secondary | ICD-10-CM

## 2020-09-12 NOTE — ED Triage Notes (Signed)
Patient presents to Urgent Care with complaints of left ear pain since yesterday. Has a hx of seasonal allergies.   Denies fever.

## 2020-09-12 NOTE — Discharge Instructions (Addendum)
Take plain over-the-counter Mucinex and ibuprofen for your symptoms.  Follow up with your primary care provider if your symptoms are not improving.

## 2020-09-12 NOTE — ED Provider Notes (Signed)
Roderic Palau    CSN: [id number] Arrival date & time: 09/12/20  1018      History   Chief Complaint Chief Complaint  Patient presents with   Otalgia     HPI Diana Dixon is a 60 y.o. female.  Patient presents with 1 day history of left ear pain.  She denies fever, chills, sore throat, cough, shortness of breath, or other symptoms.  No treatments attempted at home.  Her medical history includes type 1 diabetes and hypertension.  The history is provided by the patient and medical records.   Past Medical History:  Diagnosis Date   Allergic rhinitis 05/20/2019   Anxiety    Depression    Diabetes mellitus type I ([place])    Diabetes mellitus without complication (HCC)    Type I   Endometriosis    Fibroid    Generalized anxiety disorder    GERD (gastroesophageal reflux disease)    Heart murmur    History of hepatitis C virus infection 20 years ago   She can't recall but did get treated for HCV and it was resolved.    Hypertension    Major depressive disorder, single episode, moderate ([place])     Patient Active Problem List   Diagnosis Date Noted   Allergic rhinitis 05/20/2019   Hypercholesterolemia 01/28/2017   Anxiety 06/06/2014   Depression, major, single episode, moderate ([place]) 06/06/2014   Depressive disorder 06/06/2014   Insulin dependent type 1 diabetes mellitus ([place]) 06/06/2014   H/O gastrointestinal disease 06/06/2014   Anxiety, generalized 06/06/2014   H/O: HTN (hypertension) 06/06/2014   Vaginal vault smear abnormal 06/06/2014   Essential hypertension 09/21/2012   Depression 09/21/2012   Uncontrolled type 1 diabetes mellitus ([place]) 09/15/2012    Past Surgical History:  Procedure Laterality Date   ABDOMINAL HYSTERECTOMY     COLONOSCOPY WITH PROPOFOL N/A 09/27/2019   Procedure: COLONOSCOPY WITH PROPOFOL;  Surgeon: Jonathon Bellows, MD;  Location: [facility] ENDOSCOPY;  Service: Gastroenterology;  Laterality: N/A;   COLONOSCOPY WITH PROPOFOL N/A 03/30/2020    Procedure: COLONOSCOPY WITH PROPOFOL;  Surgeon: Robert Bellow, MD;  Location: [facility] ENDOSCOPY;  Service: Endoscopy;  Laterality: N/A;   EYE SURGERY     PARS PLANA VITRECTOMY Left 10/11/2014   Procedure: PARS PLANA VITRECTOMY WITH 25 GAUGE,CATARACT EXTRACTION WITH IOL INSERTION , ENDOLASER;  Surgeon: Milus Height, MD;  Location: [facility] ORS;  Service: Ophthalmology;  Laterality: Left;  [place] AP5  CDE CASETTE LOT # [id number] h   SHOULDER SURGERY     TONSILLECTOMY     TONSILLECTOMY      OB History   No obstetric history on file.      Home Medications    Prior to Admission medications   Medication Sig Start Date End Date Taking? Authorizing Provider  amLODipine (NORVASC) 5 MG tablet Take 1 tablet (5 mg total) by mouth daily. 05/29/20   Elayne Snare, MD  Blood Glucose Monitoring Suppl (FREESTYLE LITE) DEVI Use 1-4 times daily as needed DX EE10.649 02/24/20   Elayne Snare, MD  Blood Glucose Monitoring Suppl (FREESTYLE LITE) w/Device KIT 1 each by Does not apply route in the morning and at bedtime. 02/23/20   Elayne Snare, MD  cetirizine (ZYRTEC) 10 MG tablet Take 1 tablet (10 mg total) by mouth daily. 01/25/20   Crecencio Mc, MD  Continuous Blood Gluc Sensor (FREESTYLE LIBRE 2 SENSOR) MISC 2 Devices by Does not apply route every 14 (fourteen) days. 05/29/20   Elayne Snare, MD  Continuous Blood  Gluc Sensor (FREESTYLE LIBRE 2 SENSOR) MISC 2 Devices by Does not apply route every 14 (fourteen) days. 05/29/20   Elayne Snare, MD  FREESTYLE LITE test strip USE TO CHECK BLOOD SUGAR TWICE DAILY 02/24/20   Elayne Snare, MD  glucose blood (FREESTYLE LITE) test strip Use 1-4 times daily as needed DX E10.649 02/24/20   Elayne Snare, MD  hydrocortisone 2.5 % cream  05/16/19   [provider]  insulin aspart (NOVOLOG FLEXPEN) 100 UNIT/ML FlexPen INJECT 11 TO 14 UNITS UNDER THE SKIN THREE TIMES DAILY  Can take up to 50 units per day 05/30/20   Elayne Snare, MD  insulin glargine (LANTUS SOLOSTAR) 100 UNIT/ML  Solostar Pen Inject 20 units in the morning and 30 units in the evening 05/30/20   Elayne Snare, MD  insulin glargine, 1 Unit Dial, (TOUJEO SOLOSTAR) 300 UNIT/ML Solostar Pen 20 units in the morning and 30 units in the evening 05/29/20   Elayne Snare, MD  Insulin Pen Needle (BD PEN NEEDLE NANO U/F) 32G X 4 MM MISC USE TO INJECT INSULIN AS DIRECTED 4 TIMES A DAY 08/01/20   Elayne Snare, MD  metoCLOPramide (REGLAN) 5 MG tablet Take 5 mg by mouth 4 (four) times daily.    [provider]  Microlet Lancets MISC Use 1-4 times daily as needed DX EE10.649 02/24/20   Elayne Snare, MD  olmesartan (BENICAR) 20 MG tablet Take 1 tablet (20 mg total) by mouth daily. 05/29/20   Elayne Snare, MD  pantoprazole (PROTONIX) 40 MG tablet Take 1 tablet (40 mg total) by mouth daily. 05/30/20   Elayne Snare, MD  PARoxetine (PAXIL) 20 MG tablet Take 1.5 tablets (30 mg total) by mouth daily. 05/30/20   Elayne Snare, MD  pravastatin (PRAVACHOL) 40 MG tablet Take 1 tablet (40 mg total) by mouth at bedtime. 05/30/20   Elayne Snare, MD  valACYclovir (VALTREX) 500 MG tablet Take 500 mg by mouth 2 (two) times daily.    [provider]    Family History Family History  Problem Relation Age of Onset   Thyroid disease Mother    Breast cancer Mother 52   Hypertension Mother    Depression Mother    Anxiety disorder Mother    Colon polyps Mother    Breast cancer Maternal Aunt 32   Cancer Maternal Aunt    Breast cancer Cousin 85       maternal side    Social History Social History   Tobacco Use   Smoking status: Never   Smokeless tobacco: Never  Vaping Use   Vaping Use: Never used  Substance Use Topics   Alcohol use: No    Alcohol/week: 0.0 standard drinks   Drug use: No     Allergies   Penicillins and Sulfa antibiotics   Review of Systems Review of Systems  Constitutional:  Negative for chills and fever.  HENT:  Positive for ear pain. Negative for sore throat.   Respiratory:  Negative for cough and  shortness of breath.   Cardiovascular:  Negative for chest pain and palpitations.  Gastrointestinal:  Negative for abdominal pain and vomiting.  Skin:  Negative for color change and rash.  All other systems reviewed and are negative.   Physical Exam Triage Vital Signs ED Triage Vitals  Enc Vitals Group     BP      Pulse      Resp      Temp      Temp src  SpO2      Weight      Height      Head Circumference      Peak Flow      Pain Score      Pain Loc      Pain Edu?      Excl. in [place]?    No data found.  Updated Vital Signs BP (!) 137/55 (BP Location: Left Arm)   Pulse 79   Temp 98.8 F (37.1 C) (Oral)   Resp 18   SpO2 94%   Visual Acuity Right Eye Distance:   Left Eye Distance:   Bilateral Distance:    Right Eye Near:   Left Eye Near:    Bilateral Near:     Physical Exam Vitals and nursing note reviewed.  Constitutional:      General: She is not in acute distress.    Appearance: She is well-developed.  HENT:     Head: Normocephalic and atraumatic.     Right Ear: Tympanic membrane and ear canal normal.     Left Ear: Tympanic membrane and ear canal normal.     Nose: Nose normal.     Mouth/Throat:     Mouth: Mucous membranes are moist.     Pharynx: Oropharynx is clear.  Eyes:     Conjunctiva/sclera: Conjunctivae normal.  Cardiovascular:     Rate and Rhythm: Normal rate and regular rhythm.     Heart sounds: Normal heart sounds.  Pulmonary:     Effort: Pulmonary effort is normal. No respiratory distress.     Breath sounds: Normal breath sounds.  Abdominal:     Palpations: Abdomen is soft.     Tenderness: There is no abdominal tenderness.  Musculoskeletal:     Cervical back: Neck supple.  Skin:    General: Skin is warm and dry.  Neurological:     General: No focal deficit present.     Mental Status: She is alert and oriented to person, place, and time.     Gait: Gait normal.  Psychiatric:        Mood and Affect: Mood normal.        Behavior:  Behavior normal.     UC Treatments / Results  Labs (all labs ordered are listed, but only abnormal results are displayed) Labs Reviewed - No data to display  EKG   Radiology No results found.  Procedures Procedures (including critical care time)  Medications Ordered in UC Medications - No data to display  Initial Impression / Assessment and Plan / UC Course  I have reviewed the triage vital signs and the nursing notes.  Pertinent labs & imaging results that were available during my care of the patient were reviewed by me and considered in my medical decision making (see chart for details).   Left otalgia and eustachian tube dysfunction.  Patient declines COVID test.  Treating with over-the-counter plain Mucinex and ibuprofen.  Education provided on earaches and eustachian tube dysfunction.  Instructed patient to follow-up with her PCP if her symptoms are not improving.  She agrees to plan of care.   Final Clinical Impressions(s) / UC Diagnoses   Final diagnoses:  Acute otalgia, left  Acute dysfunction of left eustachian tube     Discharge Instructions      Take plain over-the-counter Mucinex and ibuprofen for your symptoms.  Follow up with your primary care provider if your symptoms are not improving.         ED Prescriptions  None    PDMP not reviewed this encounter.   Sharion Balloon, NP 09/12/20 1106

## 2020-10-04 ENCOUNTER — Ambulatory Visit: Payer: 59 | Admitting: Endocrinology

## 2020-11-13 ENCOUNTER — Ambulatory Visit: Payer: 59 | Admitting: Endocrinology

## 2020-12-25 ENCOUNTER — Other Ambulatory Visit: Payer: Self-pay | Admitting: Endocrinology

## 2021-01-07 ENCOUNTER — Other Ambulatory Visit: Payer: Self-pay | Admitting: Endocrinology

## 2021-01-07 DIAGNOSIS — E1065 Type 1 diabetes mellitus with hyperglycemia: Secondary | ICD-10-CM

## 2021-01-23 ENCOUNTER — Other Ambulatory Visit: Payer: Self-pay | Admitting: Endocrinology

## 2021-01-23 ENCOUNTER — Other Ambulatory Visit: Payer: Self-pay | Admitting: Internal Medicine

## 2021-01-30 ENCOUNTER — Other Ambulatory Visit: Payer: Self-pay | Admitting: Endocrinology

## 2021-02-12 DIAGNOSIS — E103593 Type 1 diabetes mellitus with proliferative diabetic retinopathy without macular edema, bilateral: Secondary | ICD-10-CM | POA: Diagnosis not present

## 2021-02-12 DIAGNOSIS — H2511 Age-related nuclear cataract, right eye: Secondary | ICD-10-CM | POA: Diagnosis not present

## 2021-02-28 ENCOUNTER — Telehealth: Payer: Self-pay | Admitting: Pharmacy Technician

## 2021-02-28 ENCOUNTER — Other Ambulatory Visit: Payer: Self-pay

## 2021-02-28 ENCOUNTER — Encounter: Payer: Self-pay | Admitting: Endocrinology

## 2021-02-28 ENCOUNTER — Other Ambulatory Visit (HOSPITAL_COMMUNITY): Payer: Self-pay

## 2021-02-28 ENCOUNTER — Telehealth: Payer: Self-pay | Admitting: Endocrinology

## 2021-02-28 DIAGNOSIS — E10649 Type 1 diabetes mellitus with hypoglycemia without coma: Secondary | ICD-10-CM

## 2021-02-28 DIAGNOSIS — E1065 Type 1 diabetes mellitus with hyperglycemia: Secondary | ICD-10-CM

## 2021-02-28 MED ORDER — FREESTYLE LITE TEST VI STRP: ORAL_STRIP | 1 refills | Status: DC

## 2021-02-28 MED ORDER — LANTUS SOLOSTAR 100 UNIT/ML ~~LOC~~ SOPN: PEN_INJECTOR | SUBCUTANEOUS | 1 refills | Status: DC

## 2021-02-28 MED ORDER — INSULIN LISPRO (1 UNIT DIAL) 100 UNIT/ML (KWIKPEN): PEN_INJECTOR | SUBCUTANEOUS | 1 refills | Status: DC

## 2021-02-28 MED ORDER — ULTICARE MICRO PEN NEEDLES 32G X 4 MM MISC: 1 refills | Status: DC

## 2021-02-28 NOTE — Telephone Encounter (Signed)
insulin aspart (NOVOLOG FLEXPEN) 100 UNIT/ML FlexPen insulin glargine (LANTUS SOLOSTAR) 100 UNIT/ML Solostar Pen ULTICARE MICRO PEN NEEDLES 32G X 4 MM MISC glucose blood (FREESTYLE LITE) test strip Continuous Blood Gluc Sensor (FREESTYLE LIBRE 2 SENSOR) MISC  Patient insurance is requesting prior approval on the above medications. Please provide and forward confirmation to patient via email.

## 2021-02-28 NOTE — Telephone Encounter (Signed)
Rx sent to pharmacy   

## 2021-02-28 NOTE — Telephone Encounter (Signed)
Prescriptions sent and handled on a different encounter

## 2021-02-28 NOTE — Telephone Encounter (Signed)
Prescriptions have been sent to pharmacy.Contacted patient and notified of cost

## 2021-03-08 ENCOUNTER — Other Ambulatory Visit (HOSPITAL_COMMUNITY): Payer: Self-pay

## 2021-03-08 NOTE — Telephone Encounter (Signed)
Pharmacist states insurance does not cover the lantus and humalog. Status lantus is requiring PA and is $100 wth a coupon and humalog is over $100. Please advise

## 2021-03-11 ENCOUNTER — Other Ambulatory Visit (HOSPITAL_COMMUNITY): Payer: Self-pay

## 2021-03-15 ENCOUNTER — Telehealth: Payer: Self-pay

## 2021-03-15 ENCOUNTER — Other Ambulatory Visit (HOSPITAL_COMMUNITY): Payer: Self-pay

## 2021-03-15 NOTE — Telephone Encounter (Signed)
Patient Advocate Encounter   Received notification from patient's pharmacy that prior authorization for Lantus Solostar is required by his/her insurance [employer].   PA submitted on 03/15/21  Key#: [id number]  Status is pending    [facility] will continue to follow:  Patient Advocate Fax: [phone]

## 2021-03-20 ENCOUNTER — Other Ambulatory Visit (HOSPITAL_COMMUNITY): Payer: Self-pay

## 2021-03-20 NOTE — Telephone Encounter (Signed)
No there isn't. Novolog is the equivalent. However with GoodRx 1 vial of Humalog is as low as $50.37. With the current instructions 1 vial would last 23.

## 2021-03-22 MED ORDER — BASAGLAR KWIKPEN 100 UNIT/ML ~~LOC~~ SOPN: PEN_INJECTOR | SUBCUTANEOUS | 2 refills | Status: DC

## 2021-03-22 NOTE — Addendum Note (Signed)
Addended by: Cinda Quest on: 03/22/2021 01:45 PM   Modules accepted: Orders

## 2021-03-22 NOTE — Telephone Encounter (Signed)
Patient has been notified and Rx has been sent in.

## 2021-04-06 ENCOUNTER — Encounter: Payer: Self-pay | Admitting: Endocrinology

## 2021-04-11 ENCOUNTER — Other Ambulatory Visit: Payer: Self-pay

## 2021-04-11 DIAGNOSIS — I10 Essential (primary) hypertension: Secondary | ICD-10-CM

## 2021-04-11 DIAGNOSIS — E10649 Type 1 diabetes mellitus with hypoglycemia without coma: Secondary | ICD-10-CM

## 2021-04-11 DIAGNOSIS — E1065 Type 1 diabetes mellitus with hyperglycemia: Secondary | ICD-10-CM

## 2021-04-11 DIAGNOSIS — E7800 Pure hypercholesterolemia, unspecified: Secondary | ICD-10-CM

## 2021-04-11 MED ORDER — OLMESARTAN MEDOXOMIL 20 MG PO TABS: 20.0000 mg | ORAL_TABLET | Freq: Every day | ORAL | 1 refills | Status: AC

## 2021-04-11 MED ORDER — PRAVASTATIN SODIUM 40 MG PO TABS: 40.0000 mg | ORAL_TABLET | Freq: Every day | ORAL | 3 refills | Status: DC

## 2021-04-11 MED ORDER — FREESTYLE LITE TEST VI STRP: ORAL_STRIP | 1 refills | Status: DC

## 2021-04-11 MED ORDER — AMLODIPINE BESYLATE 5 MG PO TABS: 5.0000 mg | ORAL_TABLET | Freq: Every day | ORAL | 1 refills | Status: AC

## 2021-04-11 MED ORDER — ULTICARE MICRO PEN NEEDLES 32G X 4 MM MISC: 1 refills | Status: DC

## 2021-04-11 NOTE — Telephone Encounter (Signed)
Spoke with patient and medication has been sent to pharmacy ?

## 2021-04-15 ENCOUNTER — Other Ambulatory Visit: Payer: Self-pay

## 2021-04-15 ENCOUNTER — Other Ambulatory Visit (HOSPITAL_COMMUNITY): Payer: Self-pay

## 2021-04-15 ENCOUNTER — Encounter: Payer: Self-pay | Admitting: Endocrinology

## 2021-04-15 ENCOUNTER — Ambulatory Visit (INDEPENDENT_AMBULATORY_CARE_PROVIDER_SITE_OTHER): Payer: BLUE CROSS/BLUE SHIELD | Admitting: Endocrinology

## 2021-04-15 VITALS — BP 138/60 | HR 80 | Ht 62.0 in | Wt 144.8 lb

## 2021-04-15 DIAGNOSIS — E7800 Pure hypercholesterolemia, unspecified: Secondary | ICD-10-CM | POA: Diagnosis not present

## 2021-04-15 DIAGNOSIS — I10 Essential (primary) hypertension: Secondary | ICD-10-CM

## 2021-04-15 DIAGNOSIS — E10649 Type 1 diabetes mellitus with hypoglycemia without coma: Secondary | ICD-10-CM | POA: Diagnosis not present

## 2021-04-15 LAB — LIPID PANEL
Cholesterol: 141 mg/dL (ref 0–200)
HDL: 52.6 mg/dL (ref >39.00)
LDL Cholesterol: 66 mg/dL (ref 0–99)
NonHDL: 87.94
Total CHOL/HDL Ratio: 3
Triglycerides: 111 mg/dL (ref 0.0–149.0)
VLDL: 22.2 mg/dL (ref 0.0–40.0)

## 2021-04-15 LAB — COMPREHENSIVE METABOLIC PANEL
ALT: 19 U/L (ref 0–35)
AST: 19 U/L (ref 0–37)
Albumin: 4.6 g/dL (ref 3.5–5.2)
Alkaline Phosphatase: 93 U/L (ref 39–117)
BUN: 15 mg/dL (ref 6–23)
CO2: 27 mEq/L (ref 19–32)
Calcium: 9.9 mg/dL (ref 8.4–10.5)
Chloride: 103 mEq/L (ref 96–112)
Creatinine, Ser: 0.96 mg/dL (ref 0.40–1.20)
GFR: 64.4 mL/min (ref >60.00)
Glucose, Bld: 100 mg/dL — ABNORMAL HIGH (ref 70–99)
Potassium: 4.4 mEq/L (ref 3.5–5.1)
Sodium: 140 mEq/L (ref 135–145)
Total Bilirubin: 0.5 mg/dL (ref 0.2–1.2)
Total Protein: 6.6 g/dL (ref 6.0–8.3)

## 2021-04-15 LAB — MICROALBUMIN / CREATININE URINE RATIO
Creatinine,U: 104.5 mg/dL
Microalb Creat Ratio: 3 mg/g (ref 0.0–30.0)
Microalb, Ur: 3.2 mg/dL — ABNORMAL HIGH (ref 0.0–1.9)

## 2021-04-15 LAB — POCT GLYCOSYLATED HEMOGLOBIN (HGB A1C): Hemoglobin A1C: 9.2 % — AB (ref 4.0–5.6)

## 2021-04-15 LAB — TSH: TSH: 0.55 u[IU]/mL (ref 0.35–5.50)

## 2021-04-15 MED ORDER — FREESTYLE LIBRE 3 SENSOR MISC: 1.0000 | 2 refills | Status: DC

## 2021-04-15 NOTE — Patient Instructions (Addendum)
Check blood sugars on waking up 5 days a week ? ?Also check blood sugars about 2 hours after meals and do this after different meals by rotation ? ?Recommended blood sugar levels on waking up are 90-130 and about 2 hours after meal is 130-180 ? ?Please bring your blood sugar monitor to each visit, thank you ? ?Adjust am Lantus/ Basaglar to keep supper sugar <140 ? ?Must take Humalog 10 min before each meal ? ?

## 2021-04-15 NOTE — Progress Notes (Signed)
Patient ID: Diana Dixon, female   DOB: 1961/02/08, 61 y.o.   MRN: [id number]   Reason for Appointment:  follow-up   History of Present Illness   Diagnosis: Type 1 DIABETES MELITUS, diagnosis made in 1967      She has had long-standing type 1 diabetes with persistently poor control because of variability in blood sugars and difficulty with various self-care issues She has been reluctant to do carbohydrate counting to adjust her insulin doses and has variable readings after meals especially lunch Previously has had times when she would forget her Lantus in the evening and also mealtime dose She may have done a little better with taking Lantus twice a day but does not try to adjust the doses on her own    Recent history:  Insulin regimen: Lantus 18---28, Novolog 8-9 units at breakfast and 9-10 units at lunch and supper   A1c today is 9.2 compared to 7.3, was previously 6.8   Current blood sugar patterns and problems identified: She has not been seen for almost a year  Not clear why her blood sugars are much higher but she appears to be taking at least her lunchtime dose 1 to 2 hours after eating instead of before eating and this will cause blood sugars to be over 300 frequently. Also she did not go back to the freestyle libre because of high out-of-pocket expense She is mostly checking blood sugars in the morning, after lunch and only rarely in the evening  As before her FASTING blood sugars are not consistently but most of them are fairly well controlled without hypoglycemia overnight  She is now going to switch to [employer] instead of Lantus She is concerned about the cost of Humalog  As before she is still eating cereal in the morning but does not appear to have significantly high readings at lunch  She is not clear what has made her lose weight  Recently not adjusting her mealtime dose based on what she is eating and taking about the same dose for all meals   DIET:  May have unbalanced meals like Pop tarts, waffles or breakfast cereal.  May have high fat meats at meals.   Is drinking regular soft drinks and sweet tea at meals, less at breakfast    Glucometer: Freestyle lite        Blood Glucose readings from download:    PRE-MEAL Fasting Lunch Dinner Bedtime Overall  Glucose range: 88-294 67-376 187-491  67-491  Mean/median: 175    224   POST-MEAL PC Breakfast PC Lunch PC Dinner  Glucose range:  134-431 346  Mean/median:  275    Prior  PRE-MEAL Fasting Lunch Dinner Bedtime Overall  Glucose range:  45-394  46-395  500    Mean/median:  160     188   POST-MEAL PC Breakfast PC Lunch PC Dinner  Glucose range:   ?  Mean/median:        Meals: 3 meals per day.  Eating eggs/meat for breakfast frequently without carbohydrate; dinner 5-6 pm           Wt Readings from Last 3 Encounters:  04/15/21 144 lb 12.8 oz (65.7 kg)  05/29/20 152 lb 9.6 oz (69.2 kg)  03/30/20 151 lb (68.5 kg)     Lab Results  Component Value Date   HGBA1C 7.3 (A) 05/29/2020   HGBA1C 6.8 (A) 01/04/2020   HGBA1C 8.0 (A) 09/09/2019   Lab Results  Component Value Date  MICROALBUR 2.1 (H) 05/29/2020   LDLCALC 74 01/04/2020   CREATININE 0.96 01/04/2020    Lab Results  Component Value Date   MICROALBUR 2.1 (H) 05/29/2020    Allergies as of 04/15/2021       Reactions   Penicillins    Sulfa Antibiotics         Medication List        Accurate as of April 15, 2021  1:29 PM. If you have any questions, ask your nurse or doctor.          amLODipine 5 MG tablet Commonly known as: NORVASC Take 1 tablet (5 mg total) by mouth daily.   cetirizine 10 MG tablet Commonly known as: ZYRTEC TAKE 1 TABLET BY MOUTH ONCE A DAY   FreeStyle Libre 2 Sensor Misc 2 Devices by Does not apply route every 14 (fourteen) days.   FreeStyle Libre 2 Sensor Misc 2 Devices by Does not apply route every 14 (fourteen) days.   FREESTYLE LITE test strip Generic drug: glucose  blood Use as instructed   FreeStyle Lite w/Device Kit 1 each by Does not apply route in the morning and at bedtime.   FreeStyle [employer] Use 1-4 times daily as needed DX EE10.649   hydrocortisone 2.5 % cream   insulin lispro 100 UNIT/ML KwikPen Commonly known as: HumaLOG KwikPen INJECT 11 TO 14 UNITS UNDER THE SKIN THREE TIMES What changed: additional instructions   metoCLOPramide 5 MG tablet Commonly known as: REGLAN Take 5 mg by mouth 4 (four) times daily.   Microlet Lancets Misc Use 1-4 times daily as needed DX EE10.649   NovoLOG FlexPen 100 UNIT/ML FlexPen Generic drug: insulin aspart INJECT 11 TO 14 UNITS UNDER THE SKIN THREE TIMES DAILY  Can take up to 50 units per day   olmesartan 20 MG tablet Commonly known as: BENICAR Take 1 tablet (20 mg total) by mouth daily.   pantoprazole 40 MG tablet Commonly known as: PROTONIX Take 1 tablet (40 mg total) by mouth daily.   PARoxetine 20 MG tablet Commonly known as: PAXIL TAKE 1 AND 1/2 TABLETS BY MOUTH DAILY   pravastatin 40 MG tablet Commonly known as: PRAVACHOL Take 1 tablet (40 mg total) by mouth at bedtime.   Toujeo SoloStar 300 UNIT/ML Solostar Pen Generic drug: insulin glargine (1 Unit Dial) 20 units in the morning and 30 units in the evening   Basaglar KwikPen 100 UNIT/ML Inject 20 units in the morning and 30 units in the evening   UltiCare Micro Pen Needles 32G X 4 MM Misc Generic drug: Insulin Pen Needle USE TO INJECT INSULIN AS DIRECTED 4 TIMES DAILY   valACYclovir 500 MG tablet Commonly known as: VALTREX Take 500 mg by mouth 2 (two) times daily.        Allergies:  Allergies  Allergen Reactions   Penicillins    Sulfa Antibiotics     Past Medical History:  Diagnosis Date   Allergic rhinitis 05/20/2019   Anxiety    Depression    Diabetes mellitus type I ([place])    Diabetes mellitus without complication (HCC)    Type I   Endometriosis    Fibroid    Generalized anxiety disorder    GERD  (gastroesophageal reflux disease)    Heart murmur    History of hepatitis C virus infection 20 years ago   She can't recall but did get treated for HCV and it was resolved.    Hypertension    Major depressive disorder, single  episode, moderate ([place])     Past Surgical History:  Procedure Laterality Date   ABDOMINAL HYSTERECTOMY     COLONOSCOPY WITH PROPOFOL N/A 09/27/2019   Procedure: COLONOSCOPY WITH PROPOFOL;  Surgeon: Jonathon Bellows, MD;  Location: [facility] ENDOSCOPY;  Service: Gastroenterology;  Laterality: N/A;   COLONOSCOPY WITH PROPOFOL N/A 03/30/2020   Procedure: COLONOSCOPY WITH PROPOFOL;  Surgeon: Robert Bellow, MD;  Location: [facility] ENDOSCOPY;  Service: Endoscopy;  Laterality: N/A;   EYE SURGERY     PARS PLANA VITRECTOMY Left 10/11/2014   Procedure: PARS PLANA VITRECTOMY WITH 25 GAUGE,CATARACT EXTRACTION WITH IOL INSERTION , ENDOLASER;  Surgeon: Milus Height, MD;  Location: [facility] ORS;  Service: Ophthalmology;  Laterality: Left;  [place] AP5  CDE CASETTE LOT # [id number] h   SHOULDER SURGERY     TONSILLECTOMY     TONSILLECTOMY      Family History  Problem Relation Age of Onset   Thyroid disease Mother    Breast cancer Mother 66   Hypertension Mother    Depression Mother    Anxiety disorder Mother    Colon polyps Mother    Breast cancer Maternal Aunt 78   Cancer Maternal Aunt    Breast cancer Cousin 3       maternal side    Social History:  reports that she has never smoked. She has never used smokeless tobacco. She reports that she does not drink alcohol and does not use drugs.  Review of Systems:  HYPERTENSION:  she has had long-standing hypertension treated with amlodipine and Benicar 20 mg  Blood pressure well controlled   BP Readings from Last 3 Encounters:  04/15/21 138/60  09/12/20 (!) 137/55  05/29/20 (!) 122/52    Has history of hyperlipidemia   She is taking pravastatin 40 mg as before   Lab Results  Component Value Date   CHOL 141 01/04/2020   HDL  53.80 01/04/2020   LDLCALC 74 01/04/2020   LDLDIRECT 88.8 12/22/2013   TRIG 68.0 01/04/2020   CHOLHDL 3 01/04/2020    She has had mild long-standing depression controlled with Paxil and this was prescribed by psychiatrist  No symptoms of numbness or tingling in her feet  Eye exam 1/22     Examination:   BP 138/60    Pulse 80    Ht _0  (1.575 m)    Wt 144 lb 12.8 oz (65.7 kg)    SpO2 97%    BMI 26.48 kg/m   Body mass index is 26.48 kg/m.   Diabetic Foot Exam - Simple   Simple Foot Form Diabetic Foot exam was performed with the following findings: Yes   Visual Inspection No deformities, no ulcerations, no other skin breakdown bilaterally: Yes Sensation Testing Intact to touch and monofilament testing bilaterally: Yes Pulse Check Posterior Tibialis and Dorsalis pulse intact bilaterally: Yes Comments      ASSESSMENT/ PLAN:   Diabetes type 1 on basal bolus insulin  See history of present illness for detailed discussion of current diabetes management, blood sugar patterns and problems identified   A1c is 9.2 compared to 7.3  Her blood sugars are significantly higher especially postprandial Also may have lost weight from her hyperglycemia  Again she does not want to go on brand-name insulins and continues to take Lantus or glargine insulin  Most of her high readings are related to not taking her lunchtime Humalog before starting to eat Likely has some well-balanced meals and regular soft drinks causing high sugars also as before  RECOMMENDATIONS: She will start taking her HUMALOG consistently before every meal, preferably 10 minutes ahead of time  discussed need for more frequent glucose monitoring to help assess her mealtime insulin adjustment She will see if the freestyle libre 3 is covered and will send another prescription to her pharmacy and she will download the app on the phone to read this Again reminded her to change the amount of Humalog she is taking  based  on portion size and carbohydrate intake especially at lunch and dinnertime  Needs to try and be getting postprandial blood sugars under 180 If she is still not getting adequate control to increase the Premeal blood sugar for similar meals the next time Also if her blood sugars are consistently high before dinnertime she will go up at least 2 to 4 units in the morning Lantus No change in evening Lantus since her morning readings are fairly good usually  HYPERTENSION: Her blood pressure is  well-controlled Continue current regimen  To have microalbumin checked  LIPIDS: Controlled on pravastatin  Will recheck her lipid panel today   She needs to start following with a PCP for her other medications and general care  Patient Instructions  Check blood sugars on waking up 5 days a week  Also check blood sugars about 2 hours after meals and do this after different meals by rotation  Recommended blood sugar levels on waking up are 90-130 and about 2 hours after meal is 130-180  Please bring your blood sugar monitor to each visit, thank you  Adjust am Lantus/ Basaglar to keep supper sugar <140      Elayne Snare 04/15/2021, 1:29 PM    Labs: All ok

## 2021-04-16 ENCOUNTER — Encounter: Payer: Self-pay | Admitting: Endocrinology

## 2021-04-16 ENCOUNTER — Other Ambulatory Visit (HOSPITAL_COMMUNITY): Payer: Self-pay

## 2021-04-16 NOTE — Telephone Encounter (Signed)
Can we start PA for freestyle libre 3? ?

## 2021-04-17 NOTE — Telephone Encounter (Signed)
Patient is aware. States that is too expensive for her. Will continue fingerstick. ?

## 2021-04-22 ENCOUNTER — Ambulatory Visit
Admission: EM | Admit: 2021-04-22 | Discharge: 2021-04-22 | Disposition: A | Discharge status: Home / Self Care | Payer: BLUE CROSS/BLUE SHIELD | Attending: Internal Medicine | Admitting: Internal Medicine

## 2021-04-22 ENCOUNTER — Other Ambulatory Visit: Payer: Self-pay

## 2021-04-22 ENCOUNTER — Encounter: Payer: Self-pay | Admitting: Emergency Medicine

## 2021-04-22 DIAGNOSIS — S70262A Insect bite (nonvenomous), left hip, initial encounter: Secondary | ICD-10-CM

## 2021-04-22 DIAGNOSIS — W57XXXA Bitten or stung by nonvenomous insect and other nonvenomous arthropods, initial encounter: Secondary | ICD-10-CM

## 2021-04-22 DIAGNOSIS — H2511 Age-related nuclear cataract, right eye: Secondary | ICD-10-CM | POA: Diagnosis not present

## 2021-04-22 MED ORDER — TRIAMCINOLONE ACETONIDE 0.1 % EX CREA: 1.0000 "application " | TOPICAL_CREAM | Freq: Two times a day (BID) | CUTANEOUS | 0 refills | Status: AC

## 2021-04-22 NOTE — Discharge Instructions (Signed)
You may take Claritin as well for the itching. If it becomes painful and more red, you need to be seen again.  ?

## 2021-04-22 NOTE — ED Provider Notes (Signed)
Roderic Palau    CSN: [id number] Arrival date & time: 04/22/21  0865      History   Chief Complaint Chief Complaint  Patient presents with   Rash    HPI Diana Dixon is a 61 y.o. female who presents with a rash on her R hip x 1 week. She noticed itching and red area on L lateral buttocks area  which was pea size, and has continued itching it and since is larger now, she wants to have this checked specially since she is a diabetic. Denies this area being painful. Has been outside, and has pets who go outside, so is possible they brought something in the house. She has been applying neosporin, but has not taken any oral antihistamines.     Past Medical History:  Diagnosis Date   Allergic rhinitis 05/20/2019   Anxiety    Depression    Diabetes mellitus type I ([place])    Diabetes mellitus without complication (HCC)    Type I   Endometriosis    Fibroid    Generalized anxiety disorder    GERD (gastroesophageal reflux disease)    Heart murmur    History of hepatitis C virus infection 20 years ago   She can't recall but did get treated for HCV and it was resolved.    Hypertension    Major depressive disorder, single episode, moderate ([place])     Patient Active Problem List   Diagnosis Date Noted   Allergic rhinitis 05/20/2019   Hypercholesterolemia 01/28/2017   Anxiety 06/06/2014   Depression, major, single episode, moderate ([place]) 06/06/2014   Depressive disorder 06/06/2014   Insulin dependent type 1 diabetes mellitus ([place]) 06/06/2014   H/O gastrointestinal disease 06/06/2014   Anxiety, generalized 06/06/2014   H/O: HTN (hypertension) 06/06/2014   Vaginal vault smear abnormal 06/06/2014   Essential hypertension 09/21/2012   Depression 09/21/2012   Uncontrolled type 1 diabetes mellitus 09/15/2012    Past Surgical History:  Procedure Laterality Date   ABDOMINAL HYSTERECTOMY     COLONOSCOPY WITH PROPOFOL N/A 09/27/2019   Procedure: COLONOSCOPY WITH PROPOFOL;   Surgeon: Jonathon Bellows, MD;  Location: [facility] ENDOSCOPY;  Service: Gastroenterology;  Laterality: N/A;   COLONOSCOPY WITH PROPOFOL N/A 03/30/2020   Procedure: COLONOSCOPY WITH PROPOFOL;  Surgeon: Robert Bellow, MD;  Location: [facility] ENDOSCOPY;  Service: Endoscopy;  Laterality: N/A;   EYE SURGERY     PARS PLANA VITRECTOMY Left 10/11/2014   Procedure: PARS PLANA VITRECTOMY WITH 25 GAUGE,CATARACT EXTRACTION WITH IOL INSERTION , ENDOLASER;  Surgeon: Milus Height, MD;  Location: [facility] ORS;  Service: Ophthalmology;  Laterality: Left;  [place] AP5  CDE CASETTE LOT # [id number] h   SHOULDER SURGERY     TONSILLECTOMY     TONSILLECTOMY      OB History   No obstetric history on file.      Home Medications    Prior to Admission medications   Medication Sig Start Date End Date Taking? Authorizing Provider  triamcinolone cream (KENALOG) 0.1 % Apply 1 application. topically 2 (two) times daily. Apply lightly bid x 7-10 days 04/22/21  Yes Rodriguez-Southworth, Sunday Spillers, PA-C  amLODipine (NORVASC) 5 MG tablet Take 1 tablet (5 mg total) by mouth daily. 04/11/21   Elayne Snare, MD  Blood Glucose Monitoring Suppl (FREESTYLE LITE) DEVI Use 1-4 times daily as needed DX EE10.649 02/24/20   Elayne Snare, MD  Blood Glucose Monitoring Suppl (FREESTYLE LITE) w/Device KIT 1 each by Does not apply route in the morning and at  bedtime. 02/23/20   Elayne Snare, MD  cetirizine (ZYRTEC) 10 MG tablet TAKE 1 TABLET BY MOUTH ONCE A DAY 01/23/21   Crecencio Mc, MD  Continuous Blood Gluc Sensor (FREESTYLE LIBRE 3 SENSOR) MISC 1 Device by Does not apply route every 14 (fourteen) days. Apply 1 sensor on upper arm every 14 days for continuous glucose monitoring 04/15/21   Elayne Snare, MD  glucose blood (FREESTYLE LITE) test strip Use as instructed Patient not taking: Reported on 04/15/2021 04/11/21   Elayne Snare, MD  hydrocortisone 2.5 % cream  05/16/19   [provider]  insulin aspart (NOVOLOG FLEXPEN) 100 UNIT/ML FlexPen INJECT 11 TO 14  UNITS UNDER THE SKIN THREE TIMES DAILY  Can take up to 50 units per day Patient not taking: Reported on 04/15/2021 05/30/20   Elayne Snare, MD  Insulin Glargine [facility]) 100 UNIT/ML Inject 20 units in the morning and 30 units in the evening 03/22/21   Elayne Snare, MD  insulin glargine, 1 Unit Dial, (TOUJEO SOLOSTAR) 300 UNIT/ML Solostar Pen 20 units in the morning and 30 units in the evening 05/29/20   Elayne Snare, MD  insulin lispro (HUMALOG KWIKPEN) 100 UNIT/ML KwikPen INJECT 11 TO 14 UNITS UNDER THE SKIN THREE TIMES Patient taking differently: INJECT 8 TO 11 UNITS UNDER THE SKIN THREE TIMES 02/28/21   Elayne Snare, MD  metoCLOPramide (REGLAN) 5 MG tablet Take 5 mg by mouth 4 (four) times daily.    [provider]  Microlet Lancets MISC Use 1-4 times daily as needed DX EE10.649 02/24/20   Elayne Snare, MD  olmesartan (BENICAR) 20 MG tablet Take 1 tablet (20 mg total) by mouth daily. 04/11/21   Elayne Snare, MD  pantoprazole (PROTONIX) 40 MG tablet Take 1 tablet (40 mg total) by mouth daily. 05/30/20   Elayne Snare, MD  PARoxetine (PAXIL) 20 MG tablet TAKE 1 AND 1/2 TABLETS BY MOUTH DAILY 01/23/21   Elayne Snare, MD  pravastatin (PRAVACHOL) 40 MG tablet Take 1 tablet (40 mg total) by mouth at bedtime. 04/11/21   Elayne Snare, MD  [employer] MICRO PEN NEEDLES 32G X 4 MM MISC USE TO INJECT INSULIN AS DIRECTED 4 TIMES DAILY 04/11/21   Elayne Snare, MD  valACYclovir (VALTREX) 500 MG tablet Take 500 mg by mouth 2 (two) times daily.    [provider]    Family History Family History  Problem Relation Age of Onset   Thyroid disease Mother    Breast cancer Mother 74   Hypertension Mother    Depression Mother    Anxiety disorder Mother    Colon polyps Mother    Breast cancer Maternal Aunt 25   Cancer Maternal Aunt    Breast cancer Cousin 35       maternal side    Social History Social History   Tobacco Use   Smoking status: Never   Smokeless tobacco: Never  Vaping Use   Vaping Use:  Never used  Substance Use Topics   Alcohol use: No    Alcohol/week: 0.0 standard drinks   Drug use: No     Allergies   Penicillins and Sulfa antibiotics   Review of Systems Review of Systems  Skin:  Positive for rash.       + pruritus    Physical Exam Triage Vital Signs ED Triage Vitals  Enc Vitals Group     BP 04/22/21 1007 (!) 179/73     Pulse Rate 04/22/21 1007 77     Resp  04/22/21 1007 16     Temp 04/22/21 1007 98.2 F (36.8 C)     Temp Source 04/22/21 1007 Oral     SpO2 04/22/21 1007 95 %     Weight --      Height --      Head Circumference --      Peak Flow --      Pain Score 04/22/21 1014 5     Pain Loc --      Pain Edu? --      Excl. in [place]? --    No data found.  Updated Vital Signs BP (!) 179/73 (BP Location: Left Arm)    Pulse 77    Temp 98.2 F (36.8 C) (Oral)    Resp 16    SpO2 95%   Visual Acuity Right Eye Distance:   Left Eye Distance:   Bilateral Distance:    Right Eye Near:   Left Eye Near:    Bilateral Near:     Physical Exam Vitals and nursing note reviewed.  Constitutional:      Appearance: She is normal weight.  HENT:     Right Ear: External ear normal.     Left Ear: External ear normal.  Eyes:     General: No scleral icterus.    Conjunctiva/sclera: Conjunctivae normal.  Skin:    General: Skin is warm and dry.     Comments: L lateral buttocks with oval shape hive like area and scab in the center with a couple of linear scabs from there of about 3 mm length. The redness around it or hive is raised, and oval shape about dollar coin size. No streaking from this area, and is not hot.   Neurological:     Mental Status: She is alert and oriented to person, place, and time.     Gait: Gait normal.  Psychiatric:        Mood and Affect: Mood normal.        Behavior: Behavior normal.        Thought Content: Thought content normal.        Judgment: Judgment normal.     UC Treatments / Results  Labs (all labs ordered are listed, but  only abnormal results are displayed) Labs Reviewed - No data to display  EKG   Radiology No results found.  Procedures Procedures (including critical care time)  Medications Ordered in UC Medications - No data to display  Initial Impression / Assessment and Plan / UC Course  I have reviewed the triage vital signs and the nursing notes. Local reaction to unknown etiology, possibly insect I placed her on Triamcinolone cream as noted. See instructions.    Final Clinical Impressions(s) / UC Diagnoses   Final diagnoses:  Local reaction to bee sting, undetermined intent, initial encounter     Discharge Instructions      You may take Claritin as well for the itching. If it becomes painful and more red, you need to be seen again.      ED Prescriptions     Medication Sig Dispense Auth. Provider   triamcinolone cream (KENALOG) 0.1 % Apply 1 application. topically 2 (two) times daily. Apply lightly bid x 7-10 days 30 g Rodriguez-Southworth, Sunday Spillers, PA-C      PDMP not reviewed this encounter.   Shelby Mattocks, PA-C 04/22/21 1034

## 2021-04-22 NOTE — ED Triage Notes (Signed)
Pt here with erythematic and ecchymotic patch on her left hip x 1week. ?

## 2021-04-30 ENCOUNTER — Encounter: Payer: Self-pay | Admitting: Ophthalmology

## 2021-05-02 NOTE — Discharge Instructions (Signed)

## 2021-05-06 ENCOUNTER — Other Ambulatory Visit: Payer: Self-pay | Admitting: Obstetrics and Gynecology

## 2021-05-06 DIAGNOSIS — Z01419 Encounter for gynecological examination (general) (routine) without abnormal findings: Secondary | ICD-10-CM | POA: Diagnosis not present

## 2021-05-06 DIAGNOSIS — Z1211 Encounter for screening for malignant neoplasm of colon: Secondary | ICD-10-CM | POA: Diagnosis not present

## 2021-05-06 DIAGNOSIS — Z1272 Encounter for screening for malignant neoplasm of vagina: Secondary | ICD-10-CM | POA: Diagnosis not present

## 2021-05-06 DIAGNOSIS — Z1231 Encounter for screening mammogram for malignant neoplasm of breast: Secondary | ICD-10-CM | POA: Diagnosis not present

## 2021-05-07 ENCOUNTER — Encounter
Admission: RE | Disposition: A | Discharge status: Home / Self Care | Payer: Self-pay | Source: Home / Self Care | Attending: Ophthalmology

## 2021-05-07 ENCOUNTER — Ambulatory Visit: Payer: BLUE CROSS/BLUE SHIELD | Admitting: Anesthesiology

## 2021-05-07 ENCOUNTER — Ambulatory Visit
Admission: RE | Admit: 2021-05-07 | Discharge: 2021-05-07 | Disposition: A | Discharge status: Home / Self Care | Payer: BLUE CROSS/BLUE SHIELD | Attending: Ophthalmology | Admitting: Ophthalmology

## 2021-05-07 ENCOUNTER — Other Ambulatory Visit: Payer: Self-pay

## 2021-05-07 DIAGNOSIS — K219 Gastro-esophageal reflux disease without esophagitis: Secondary | ICD-10-CM | POA: Diagnosis not present

## 2021-05-07 DIAGNOSIS — H2511 Age-related nuclear cataract, right eye: Secondary | ICD-10-CM | POA: Diagnosis not present

## 2021-05-07 DIAGNOSIS — E1065 Type 1 diabetes mellitus with hyperglycemia: Secondary | ICD-10-CM | POA: Diagnosis not present

## 2021-05-07 DIAGNOSIS — I10 Essential (primary) hypertension: Secondary | ICD-10-CM | POA: Insufficient documentation

## 2021-05-07 DIAGNOSIS — Z794 Long term (current) use of insulin: Secondary | ICD-10-CM | POA: Diagnosis not present

## 2021-05-07 DIAGNOSIS — E1036 Type 1 diabetes mellitus with diabetic cataract: Secondary | ICD-10-CM | POA: Diagnosis not present

## 2021-05-07 HISTORY — DX: Family history of other specified conditions: Z84.89

## 2021-05-07 HISTORY — PX: CATARACT EXTRACTION W/PHACO: SHX586

## 2021-05-07 HISTORY — DX: Other specified postprocedural states: R11.2

## 2021-05-07 HISTORY — DX: Other specified postprocedural states: Z98.890

## 2021-05-07 LAB — GLUCOSE, CAPILLARY
Glucose-Capillary: 250 mg/dL — ABNORMAL HIGH (ref 70–99)
Glucose-Capillary: 253 mg/dL — ABNORMAL HIGH (ref 70–99)

## 2021-05-07 SURGERY — PHACOEMULSIFICATION, CATARACT, WITH IOL INSERTION
Anesthesia: Monitor Anesthesia Care | Site: Eye | Laterality: Right

## 2021-05-07 MED ORDER — ARMC OPHTHALMIC DILATING DROPS: 1.0000 "application " | OPHTHALMIC | Status: DC | PRN

## 2021-05-07 MED ORDER — FENTANYL CITRATE (PF) 100 MCG/2ML IJ SOLN
INTRAMUSCULAR | Status: DC | PRN
  Administered 2021-05-07: 50 ug via INTRAVENOUS

## 2021-05-07 MED ORDER — MIDAZOLAM HCL 2 MG/2ML IJ SOLN
INTRAMUSCULAR | Status: DC | PRN
  Administered 2021-05-07: 2 mg via INTRAVENOUS

## 2021-05-07 MED ORDER — TETRACAINE HCL 0.5 % OP SOLN: 1.0000 [drp] | OPHTHALMIC | Status: DC | PRN

## 2021-05-07 MED ORDER — SIGHTPATH DOSE#1 BSS IO SOLN
INTRAOCULAR | Status: DC | PRN
  Administered 2021-05-07: 15 mL

## 2021-05-07 MED ORDER — ACETAMINOPHEN 325 MG PO TABS: 325.0000 mg | ORAL_TABLET | ORAL | Status: DC | PRN

## 2021-05-07 MED ORDER — SIGHTPATH DOSE#1 NA CHONDROIT SULF-NA HYALURON 40-17 MG/ML IO SOLN
INTRAOCULAR | Status: DC | PRN
Start: 2021-05-07 — End: 2021-05-07
  Administered 2021-05-07: 1 mL via INTRAOCULAR

## 2021-05-07 MED ORDER — TETRACAINE HCL 0.5 % OP SOLN
1.0000 [drp] | OPHTHALMIC | Status: DC | PRN
  Administered 2021-05-07 (×3): 1 [drp] via OPHTHALMIC

## 2021-05-07 MED ORDER — MOXIFLOXACIN HCL 0.5 % OP SOLN
OPHTHALMIC | Status: DC | PRN
  Administered 2021-05-07: 0.2 mL via OPHTHALMIC

## 2021-05-07 MED ORDER — SIGHTPATH DOSE#1 BSS IO SOLN
INTRAOCULAR | Status: DC | PRN
  Administered 2021-05-07: 58 mL via OPHTHALMIC

## 2021-05-07 MED ORDER — SIGHTPATH DOSE#1 BSS IO SOLN
INTRAOCULAR | Status: DC | PRN
  Administered 2021-05-07: 1 mL via INTRAMUSCULAR

## 2021-05-07 MED ORDER — BRIMONIDINE TARTRATE-TIMOLOL 0.2-0.5 % OP SOLN
OPHTHALMIC | Status: DC | PRN
  Administered 2021-05-07: 1 [drp] via OPHTHALMIC

## 2021-05-07 MED ORDER — ACETAMINOPHEN 160 MG/5ML PO SOLN: 325.0000 mg | ORAL | Status: DC | PRN

## 2021-05-07 MED ORDER — LACTATED RINGERS IV SOLN: INTRAVENOUS | Status: DC

## 2021-05-07 MED ORDER — ARMC OPHTHALMIC DILATING DROPS
1.0000 "application " | OPHTHALMIC | Status: DC | PRN
  Administered 2021-05-07 (×3): 1 via OPHTHALMIC

## 2021-05-07 SURGICAL SUPPLY — 10 items
CATARACT SUITE SIGHTPATH (MISCELLANEOUS) ×2 IMPLANT
FEE CATARACT SUITE SIGHTPATH (MISCELLANEOUS) ×1 IMPLANT
GLOVE SURG ENC TEXT LTX SZ8 (GLOVE) ×2 IMPLANT
GLOVE SURG TRIUMPH 8.0 PF LTX (GLOVE) ×2 IMPLANT
LENS IOL TECNIS EYHANCE 20.5 (Intraocular Lens) ×1 IMPLANT
NDL FILTER BLUNT 18X1 1/2 (NEEDLE) ×1 IMPLANT
NEEDLE FILTER BLUNT 18X 1/2SAF (NEEDLE) ×1
NEEDLE FILTER BLUNT 18X1 1/2 (NEEDLE) ×1 IMPLANT
SYR 3ML LL SCALE MARK (SYRINGE) ×2 IMPLANT
WATER STERILE IRR 250ML POUR (IV SOLUTION) ×2 IMPLANT

## 2021-05-07 NOTE — Anesthesia Procedure Notes (Signed)
Procedure Name: [place] ?Date/Time: 05/07/2021 10:43 AM ?Performed by: Cameron Ali, CRNA ?Pre-anesthesia Checklist: Patient identified, Emergency Drugs available, Suction available, Timeout performed and Patient being monitored ?Patient Re-evaluated:Patient Re-evaluated prior to induction ?Oxygen Delivery Method: Nasal cannula ?Placement Confirmation: positive ETCO2 ? ? ? ? ?

## 2021-05-07 NOTE — Transfer of Care (Signed)
Immediate Anesthesia Transfer of Care Note ? ?Patient: Diana Dixon ? ?Procedure(s) Performed: CATARACT EXTRACTION PHACO AND INTRAOCULAR LENS PLACEMENT (IOC) RIGHT DIABETIC 5.83 00:47.9 (Right: Eye) ? ?Patient Location: PACU ? ?Anesthesia Type: MAC ? ?Level of Consciousness: awake, alert  and patient cooperative ? ?Airway and Oxygen Therapy: Patient Spontanous Breathing and Patient connected to supplemental oxygen ? ?Post-op Assessment: Post-op Vital signs reviewed, Patient's Cardiovascular Status Stable, Respiratory Function Stable, Patent Airway and No signs of Nausea or vomiting ? ?Post-op Vital Signs: Reviewed and stable ? ?Complications: No notable events documented. ? ?

## 2021-05-07 NOTE — Anesthesia Postprocedure Evaluation (Signed)
Anesthesia Post Note ? ?Patient: Diana Dixon ? ?Procedure(s) Performed: CATARACT EXTRACTION PHACO AND INTRAOCULAR LENS PLACEMENT (IOC) RIGHT DIABETIC 5.83 00:47.9 (Right: Eye) ? ? ?  ?Patient location during evaluation: PACU ?Anesthesia Type: MAC ?Level of consciousness: awake and alert ?Pain management: pain level controlled ?Vital Signs Assessment: post-procedure vital signs reviewed and stable ?Respiratory status: spontaneous breathing, nonlabored ventilation, respiratory function stable and patient connected to nasal cannula oxygen ?Cardiovascular status: stable and blood pressure returned to baseline ?Postop Assessment: no apparent nausea or vomiting ?Anesthetic complications: no ? ? ?No notable events documented. ? ?Trecia Rogers ? ? ? ? ? ?

## 2021-05-07 NOTE — Anesthesia Preprocedure Evaluation (Signed)
Anesthesia Evaluation  ?Patient identified by MRN, date of birth, ID band ?Patient awake ? ? ? ?Reviewed: ?Allergy & Precautions, H&P , NPO status , Patient's Chart, lab work & pertinent test results, reviewed documented beta blocker date and time  ? ?History of Anesthesia Complications ?(+) PONV and history of anesthetic complications ? ?Airway ?Mallampati: II ? ?TM Distance: >3 FB ?Neck ROM: full ? ? ? Dental ?no notable dental hx. ? ?  ?Pulmonary ?neg pulmonary ROS,  ?  ?Pulmonary exam normal ?breath sounds clear to auscultation ? ? ? ? ? ? Cardiovascular ?Exercise Tolerance: Good ?hypertension, negative cardio ROS ?Normal cardiovascular exam ?Rhythm:regular Rate:Normal ? ? ?  ?Neuro/Psych ?negative neurological ROS ? negative psych ROS  ? GI/Hepatic ?Neg liver ROS, GERD  Medicated and Controlled,  ?Endo/Other  ?diabetes, Poorly Controlled, Type 1, Insulin DependentTook long acting insulin this AM, glu 250 now ? Renal/GU ?negative Renal ROS  ?negative genitourinary ?  ?Musculoskeletal ? ? Abdominal ?  ?Peds ? Hematology ?negative hematology ROS ?(+)   ?Anesthesia Other Findings ? ? Reproductive/Obstetrics ?negative OB ROS ? ?  ? ? ? ? ? ? ? ? ? ? ? ? ? ?  ?  ? ? ? ? ? ? ? ? ?Anesthesia Physical ?Anesthesia Plan ? ?ASA: 3 ? ?Anesthesia Plan: MAC  ? ?Post-op Pain Management:   ? ?Induction:  ? ?PONV Risk Score and Plan:  ? ?Airway Management Planned:  ? ?Additional Equipment:  ? ?Intra-op Plan:  ? ?Post-operative Plan:  ? ?Informed Consent: I have reviewed the patients History and Physical, chart, labs and discussed the procedure including the risks, benefits and alternatives for the proposed anesthesia with the patient or authorized representative who has indicated his/her understanding and acceptance.  ? ? ? ?Dental Advisory Given ? ?Plan Discussed with: CRNA and Anesthesiologist ? ?Anesthesia Plan Comments:   ? ? ? ? ? ? ?Anesthesia Quick Evaluation ? ?

## 2021-05-07 NOTE — H&P (Signed)
[place]  ? ?Primary Care Physician:  [facility], Ander Gaster, MD ?Ophthalmologist: Dr.Aalaya Yadao ? ?Pre-Procedure History & Physical: ?HPI:  Diana Dixon is a 61 y.o. female here for cataract surgery. ?  ?Past Medical History:  ?Diagnosis Date  ? Allergic rhinitis 05/20/2019  ? Anxiety   ? Depression   ? Diabetes mellitus type I ([place])   ? Diabetes mellitus without complication ([place])   ? Type I  ? Endometriosis   ? Family history of adverse reaction to anesthesia   ? Mother - PONV  ? Fibroid   ? Generalized anxiety disorder   ? GERD (gastroesophageal reflux disease)   ? Heart murmur   ? History of hepatitis C virus infection 20 years ago  ? She can't recall but did get treated for HCV and it was resolved.   ? Hypertension   ? Major depressive disorder, single episode, moderate ([place])   ? PONV (postoperative nausea and vomiting)   ? ? ?Past Surgical History:  ?Procedure Laterality Date  ? ABDOMINAL HYSTERECTOMY    ? COLONOSCOPY WITH PROPOFOL N/A 09/27/2019  ? Procedure: COLONOSCOPY WITH PROPOFOL;  Surgeon: Jonathon Bellows, MD;  Location[facility] ENDOSCOPY;  Service: Gastroenterology;  Laterality: N/A;  ? COLONOSCOPY WITH PROPOFOL N/A 03/30/2020  ? Procedure: COLONOSCOPY WITH PROPOFOL;  Surgeon: Robert Bellow, MD;  Location: [facility] ENDOSCOPY;  Service: Endoscopy;  Laterality: N/A;  ? EYE SURGERY    ? PARS PLANA VITRECTOMY Left 10/11/2014  ? Procedure: PARS PLANA VITRECTOMY WITH 25 GAUGE,CATARACT EXTRACTION WITH IOL INSERTION , ENDOLASER;  Surgeon: Milus Height, MD;  Location: [facility] ORS;  Service: Ophthalmology;  Laterality: Left;  [place] ?AP5 ? CDE ?CASETTE LOT # [id number]  ? SHOULDER SURGERY    ? TONSILLECTOMY    ? TONSILLECTOMY    ? ? ?Prior to Admission medications   ?Medication Sig Start Date End Date Taking? Authorizing Provider  ?amLODipine (NORVASC) 5 MG tablet Take 1 tablet (5 mg total) by mouth daily. 04/11/21  Yes Elayne Snare, MD  ?cetirizine (ZYRTEC) 10 MG tablet TAKE 1 TABLET BY MOUTH ONCE A DAY 01/23/21  Yes  Crecencio Mc, MD  ?hydrocortisone 2.5 % cream  05/16/19  Yes [provider]  ?Insulin Glargine (BASAGLAR KWIKPEN) 100 UNIT/ML Inject 20 units in the morning and 30 units in the evening 03/22/21  Yes Elayne Snare, MD  ?insulin lispro (HUMALOG KWIKPEN) 100 UNIT/ML KwikPen INJECT 11 TO 14 UNITS UNDER THE SKIN THREE TIMES ?Patient taking differently: INJECT 8 TO 11 UNITS UNDER THE SKIN THREE TIMES 02/28/21  Yes Elayne Snare, MD  ?olmesartan (BENICAR) 20 MG tablet Take 1 tablet (20 mg total) by mouth daily. 04/11/21  Yes Elayne Snare, MD  ?pantoprazole (PROTONIX) 40 MG tablet Take 1 tablet (40 mg total) by mouth daily. 05/30/20  Yes Elayne Snare, MD  ?PARoxetine (PAXIL) 20 MG tablet TAKE 1 AND 1/2 TABLETS BY MOUTH DAILY 01/23/21  Yes Elayne Snare, MD  ?pravastatin (PRAVACHOL) 40 MG tablet Take 1 tablet (40 mg total) by mouth at bedtime. 04/11/21  Yes Elayne Snare, MD  ?triamcinolone cream (KENALOG) 0.1 % Apply 1 application. topically 2 (two) times daily. Apply lightly bid x 7-10 days 04/22/21  Yes Rodriguez-Southworth, Sunday Spillers, PA-C  ?Blood Glucose Monitoring Suppl (FREESTYLE LITE) DEVI Use 1-4 times daily as needed DX EE10.649 02/24/20   Elayne Snare, MD  ?Blood Glucose Monitoring Suppl (FREESTYLE LITE) w/Device KIT 1 each by Does not apply route in the morning and at bedtime. 02/23/20   Elayne Snare, MD  ?  Continuous Blood Gluc Sensor (FREESTYLE LIBRE 3 SENSOR) MISC 1 Device by Does not apply route every 14 (fourteen) days. Apply 1 sensor on upper arm every 14 days for continuous glucose monitoring 04/15/21   Elayne Snare, MD  ?insulin glargine, 1 Unit Dial, (TOUJEO SOLOSTAR) 300 UNIT/ML Solostar Pen 20 units in the morning and 30 units in the evening ?Patient not taking: Reported on 05/07/2021 05/29/20   Elayne Snare, MD  ?metoCLOPramide (REGLAN) 5 MG tablet Take 5 mg by mouth 4 (four) times daily. ?Patient not taking: Reported on 04/30/2021    [provider]  ?Microlet Lancets MISC Use 1-4 times daily as needed DX  EE10.649 02/24/20   Elayne Snare, MD  ?Flossie Buffy MICRO PEN NEEDLES 32G X 4 MM MISC USE TO INJECT INSULIN AS DIRECTED 4 TIMES DAILY 04/11/21   Elayne Snare, MD  ?valACYclovir (VALTREX) 500 MG tablet Take 500 mg by mouth 2 (two) times daily. ?Patient not taking: Reported on 04/30/2021    [provider]  ? ? ?Allergies as of 02/13/2021 - Review Complete 09/12/2020  ?Allergen Reaction Noted  ? Penicillins  09/17/2012  ? Sulfa antibiotics  09/17/2012  ? ? ?Family History  ?Problem Relation Age of Onset  ? Thyroid disease Mother   ? Breast cancer Mother 60  ? Hypertension Mother   ? Depression Mother   ? Anxiety disorder Mother   ? Colon polyps Mother   ? Breast cancer Maternal Aunt 18  ? Cancer Maternal Aunt   ? Breast cancer Cousin 11  ?     maternal side  ? ? ?Social History  ? ?Socioeconomic History  ? Marital status: Single  ?  Spouse name: Not on file  ? Number of children: 1  ? Years of education: Not on file  ? Highest education level: Some college, no degree  ?Occupational History  ?  Comment: part time  ?Tobacco Use  ? Smoking status: Never  ? Smokeless tobacco: Never  ?Vaping Use  ? Vaping Use: Never used  ?Substance and Sexual Activity  ? Alcohol use: No  ?  Alcohol/week: 0.0 standard drinks  ? Drug use: No  ? Sexual activity: Not Currently  ?  Partners: Male  ?Other Topics Concern  ? Not on file  ?Social History Narrative  ? Regular exercise: walk  ? Caffeine use: sodas  ? ?Social Determinants of Health  ? ?Financial Resource Strain: Not on file  ?Food Insecurity: Not on file  ?Transportation Needs: Not on file  ?Physical Activity: Not on file  ?Stress: Not on file  ?Social Connections: Not on file  ?Intimate Partner Violence: Not on file  ? ? ?Review of Systems: ?See HPI, otherwise negative ROS ? ?Physical Exam: ?Ht _0  (1.575 m)   Wt 65.3 kg   BMI 26.34 kg/m?  ?General:   Alert, cooperative in NAD ?Head:  Normocephalic and atraumatic. ?Respiratory:  Normal work of breathing. ?Cardiovascular:   RRR ? ?Impression/Plan: ?Scarlette Ar is here for cataract surgery. ? ?Risks, benefits, limitations, and alternatives regarding cataract surgery have been reviewed with the patient.  Questions have been answered.  All parties agreeable. ? ? ?Birder Robson, MD  05/07/2021, 10:31 AM ? ? ?

## 2021-05-07 NOTE — Op Note (Signed)
PREOPERATIVE DIAGNOSIS:  Nuclear sclerotic cataract of the right eye. ?  ?POSTOPERATIVE DIAGNOSIS:  H25.11 Cataract ?  ?OPERATIVE PROCEDURE:ORPROCALL@ ?  ?SURGEON:  Birder Robson, MD. ?  ?ANESTHESIA: ? ?Anesthesiologist: Rochel Brome, MD ?CRNA: Mayme Genta, CRNA ? ?1.      Managed anesthesia care. ?2.      0.80m of Shugarcaine was instilled in the eye following the paracentesis. ?  ?COMPLICATIONS:  None. ?  ?TECHNIQUE:   Stop and chop ?  ?DESCRIPTION OF PROCEDURE:  The patient was examined and consented in the preoperative holding area where the aforementioned topical anesthesia was applied to the right eye and then brought back to the Operating Room where the right eye was prepped and draped in the usual sterile ophthalmic fashion and a lid speculum was placed. A paracentesis was created with the side port blade and the anterior chamber was filled with viscoelastic. A near clear corneal incision was performed with the steel keratome. A continuous curvilinear capsulorrhexis was performed with a cystotome followed by the capsulorrhexis forceps. Hydrodissection and hydrodelineation were carried out with BSS on a blunt cannula. The lens was removed in a stop and chop  technique and the remaining cortical material was removed with the irrigation-aspiration handpiece. The capsular bag was inflated with viscoelastic and the Technis [id number]  lens was placed in the capsular bag without complication. The remaining viscoelastic was removed from the eye with the irrigation-aspiration handpiece. The wounds were hydrated. The anterior chamber was flushed with BSS and the eye was inflated to physiologic pressure. 0.131mof Vigamox was placed in the anterior chamber. The wounds were found to be water tight. The eye was dressed with Combigan. The patient was given protective glasses to wear throughout the day and a shield with which to sleep tonight. The patient was also given drops with which to begin a drop regimen today and  will follow-up with me in one day. ?Implant Name Type Inv. Item Serial No. Manufacturer Lot No. LRB No. Used Action  ?LENS IOL TECNIS EYHANCE 20.5 - [id number] Lens LENS IOL TECNIS EYHANCE 20.5 [id number]  Right 1 Implanted  ? ?Procedure(s) with comments: ?CATARACT EXTRACTION PHACO AND INTRAOCULAR LENS PLACEMENT (IOC) RIGHT DIABETIC 5.83 00:47.9 (Right) - Diabetic ? ?Electronically signed: WiBirder Robson/28/2023 10:55 AM ? ?

## 2021-05-08 ENCOUNTER — Encounter: Payer: Self-pay | Admitting: Ophthalmology

## 2021-06-01 ENCOUNTER — Encounter: Payer: Self-pay | Admitting: Endocrinology

## 2021-06-01 ENCOUNTER — Other Ambulatory Visit: Payer: Self-pay | Admitting: Endocrinology

## 2021-06-01 DIAGNOSIS — E1065 Type 1 diabetes mellitus with hyperglycemia: Secondary | ICD-10-CM

## 2021-06-03 MED ORDER — BD PEN NEEDLE SHORT U/F 31G X 8 MM MISC: 3 refills | Status: DC

## 2021-06-07 ENCOUNTER — Other Ambulatory Visit: Payer: Self-pay | Admitting: Endocrinology

## 2021-06-07 DIAGNOSIS — E10649 Type 1 diabetes mellitus with hypoglycemia without coma: Secondary | ICD-10-CM

## 2021-06-12 ENCOUNTER — Ambulatory Visit
Admission: RE | Admit: 2021-06-12 | Discharge: 2021-06-12 | Disposition: A | Discharge status: Home / Self Care | Payer: BLUE CROSS/BLUE SHIELD | Source: Ambulatory Visit | Attending: Obstetrics and Gynecology | Admitting: Obstetrics and Gynecology

## 2021-06-12 DIAGNOSIS — Z1231 Encounter for screening mammogram for malignant neoplasm of breast: Secondary | ICD-10-CM | POA: Diagnosis not present

## 2021-07-24 ENCOUNTER — Other Ambulatory Visit: Payer: Self-pay | Admitting: Endocrinology

## 2021-07-24 ENCOUNTER — Encounter: Payer: Self-pay | Admitting: Endocrinology

## 2021-07-24 DIAGNOSIS — E10649 Type 1 diabetes mellitus with hypoglycemia without coma: Secondary | ICD-10-CM

## 2021-07-24 MED ORDER — BASAGLAR KWIKPEN 100 UNIT/ML ~~LOC~~ SOPN: PEN_INJECTOR | SUBCUTANEOUS | 2 refills | Status: DC

## 2021-08-06 ENCOUNTER — Encounter: Payer: Self-pay | Admitting: Endocrinology

## 2021-08-19 ENCOUNTER — Ambulatory Visit: Payer: BLUE CROSS/BLUE SHIELD | Admitting: Endocrinology

## 2021-08-19 ENCOUNTER — Encounter: Payer: Self-pay | Admitting: Endocrinology

## 2021-08-19 VITALS — BP 138/50 | HR 83 | Ht 62.0 in | Wt 154.0 lb

## 2021-08-19 DIAGNOSIS — I10 Essential (primary) hypertension: Secondary | ICD-10-CM | POA: Diagnosis not present

## 2021-08-19 DIAGNOSIS — E1065 Type 1 diabetes mellitus with hyperglycemia: Secondary | ICD-10-CM | POA: Diagnosis not present

## 2021-08-19 LAB — POCT GLYCOSYLATED HEMOGLOBIN (HGB A1C): Hemoglobin A1C: 9.2 % — AB (ref 4.0–5.6)

## 2021-08-19 NOTE — Patient Instructions (Addendum)
Basaglar 24 in am, check copay card on line Basaglar 28  Check on Semglee insulin  Check blood sugars on waking up 3 days a week  Also check blood sugars about 2 hours after meals and do this after different meals by rotation  Recommended blood sugar levels on waking up are 90-130 and about 2 hours after meal is 130-160  Please bring your blood sugar monitor to each visit, thank you

## 2021-08-19 NOTE — Progress Notes (Signed)
Patient ID: Diana Dixon, female   DOB: November 14, 1960, 61 y.o.   MRN: [id number]   Reason for Appointment:  follow-up   History of Present Illness   Diagnosis: Type 1 DIABETES MELITUS, diagnosis made in 1967      She has had long-standing type 1 diabetes with persistently poor control because of variability in blood sugars and difficulty with various self-care issues She has been reluctant to do carbohydrate counting to adjust her insulin doses and has variable readings after meals especially lunch Previously has had times when she would forget her Lantus in the evening and also mealtime dose She may have done a little better with taking Lantus twice a day but does not try to adjust the doses on her own    Recent history:  Insulin regimen: Basaglar 20---28, Novolog 8-9 units at breakfast and 9-10 units at lunch and supper   A1c today is 9.2, unchanged  Current blood sugar patterns and problems identified: She has not been able to afford the continuous glucose monitoring device She is asking about an off label glucose monitoring watch Ardelia Mems which she says her mother is using but this appears to be not FDA approved even though the website is claiming this The monitor does not have the right program for the time and difficult to assess this Also unable to download this today She is concerned about the cost of Humalog and Basaglar again As before she is still eating cereal in the morning but does not appear to have significantly high readings at lunch  Her weight has gone back up Again not adjusting her mealtime dose based on what she is eating and taking about the same dose for all meals Not clear what her blood sugars are after eating as she is checking her afternoon readings at least 4 to 5 hours after the meal   DIET: May have unbalanced meals like Pop tarts, waffles or breakfast cereal.  May have high fat meats at meals.   Is drinking regular soft drinks and sweet tea  at meals, less at breakfast    Glucometer: Freestyle lite        Blood Glucose readings from monitor review   PRE-MEAL Fasting Lunch Dinner Bedtime Overall  Glucose range: 88-246  204-400+    Mean/median:     219   POST-MEAL PC Breakfast PC Lunch PC Dinner  Glucose range:   ?  Mean/median:       Prior  PRE-MEAL Fasting Lunch Dinner Bedtime Overall  Glucose range: 88-294 67-376 187-491  67-491  Mean/median: 175    224   POST-MEAL PC Breakfast PC Lunch PC Dinner  Glucose range:  134-431 346  Mean/median:  275     Meals: 3 meals per day.  Eating eggs/meat for breakfast frequently without carbohydrate; dinner 5-6 pm           Wt Readings from Last 3 Encounters:  08/19/21 154 lb (69.9 kg)  05/07/21 144 lb (65.3 kg)  04/15/21 144 lb 12.8 oz (65.7 kg)     Lab Results  Component Value Date   HGBA1C 9.2 (A) 08/19/2021   HGBA1C 9.2 (A) 04/15/2021   HGBA1C 7.3 (A) 05/29/2020   Lab Results  Component Value Date   MICROALBUR 3.2 (H) 04/15/2021   LDLCALC 66 04/15/2021   CREATININE 0.96 04/15/2021    Lab Results  Component Value Date   MICROALBUR 3.2 (H) 04/15/2021    Allergies as of 08/19/2021  Reactions   Penicillins Nausea And Vomiting, Rash   Sulfa Antibiotics Rash        Medication List        Accurate as of August 19, 2021  2:46 PM. If you have any questions, ask your nurse or doctor.          amLODipine 5 MG tablet Commonly known as: NORVASC Take 1 tablet (5 mg total) by mouth daily.   aspirin EC 81 MG tablet Take 81 mg by mouth daily. Swallow whole.   B-D ULTRAFINE III SHORT PEN 31G X 8 MM Misc Generic drug: Insulin Pen Needle USE TO INJECT INSULIN AS DIRECTED 4 TIMES DAILY USE TO INJECT INSULIN AS DIRECTED 4 TIMES DAILY Strength: 32G X 4 MM   Basaglar KwikPen 100 UNIT/ML Inject 20 units in the morning and 30 units in the evening What changed: Another medication with the same name was removed. Continue taking this medication, and  follow the directions you see here. Changed by: Elayne Snare, MD   cetirizine 10 MG tablet Commonly known as: ZYRTEC TAKE 1 TABLET BY MOUTH ONCE A DAY   FreeStyle Libre 3 Sensor Misc 1 Device by Does not apply route every 14 (fourteen) days. Apply 1 sensor on upper arm every 14 days for continuous glucose monitoring   FreeStyle Lite w/Device Kit 1 each by Does not apply route in the morning and at bedtime.   FreeStyle [employer] Use 1-4 times daily as needed DX EE10.649   hydrocortisone 2.5 % cream   insulin lispro 100 UNIT/ML KwikPen Commonly known as: HumaLOG KwikPen INJECT 11 TO 14 UNITS UNDER THE SKIN 3 TIMES DAILY   metoCLOPramide 5 MG tablet Commonly known as: REGLAN Take 5 mg by mouth 4 (four) times daily.   Microlet Lancets Misc Use 1-4 times daily as needed DX EE10.649   olmesartan 20 MG tablet Commonly known as: BENICAR Take 1 tablet (20 mg total) by mouth daily.   pantoprazole 40 MG tablet Commonly known as: PROTONIX Take 1 tablet (40 mg total) by mouth daily.   PARoxetine 20 MG tablet Commonly known as: PAXIL TAKE 1 AND 1/2 TABLETS BY MOUTH DAILY   pravastatin 40 MG tablet Commonly known as: PRAVACHOL Take 1 tablet (40 mg total) by mouth at bedtime.   triamcinolone cream 0.1 % Commonly known as: KENALOG Apply 1 application. topically 2 (two) times daily. Apply lightly bid x 7-10 days   valACYclovir 500 MG tablet Commonly known as: VALTREX Take 500 mg by mouth 2 (two) times daily.        Allergies:  Allergies  Allergen Reactions   Penicillins Nausea And Vomiting and Rash   Sulfa Antibiotics Rash    Past Medical History:  Diagnosis Date   Allergic rhinitis 05/20/2019   Anxiety    Depression    Diabetes mellitus type I ([place])    Diabetes mellitus without complication ([place])    Type I   Endometriosis    Family history of adverse reaction to anesthesia    Mother - PONV   Fibroid    Generalized anxiety disorder    GERD (gastroesophageal  reflux disease)    Heart murmur    History of hepatitis C virus infection 20 years ago   She can't recall but did get treated for HCV and it was resolved.    Hypertension    Major depressive disorder, single episode, moderate ([facility])    PONV (postoperative nausea and vomiting)     Past Surgical History:  Procedure Laterality Date   ABDOMINAL HYSTERECTOMY     CATARACT EXTRACTION W/PHACO Right 05/07/2021   Procedure: CATARACT EXTRACTION PHACO AND INTRAOCULAR LENS PLACEMENT (IOC) RIGHT DIABETIC 5.83 00:47.9;  Surgeon: Birder Robson, MD;  Location: [place];  Service: Ophthalmology;  Laterality: Right;  Diabetic   COLONOSCOPY WITH PROPOFOL N/A 09/27/2019   Procedure: COLONOSCOPY WITH PROPOFOL;  Surgeon: Jonathon Bellows, MD;  Location: [facility] ENDOSCOPY;  Service: Gastroenterology;  Laterality: N/A;   COLONOSCOPY WITH PROPOFOL N/A 03/30/2020   Procedure: COLONOSCOPY WITH PROPOFOL;  Surgeon: Robert Bellow, MD;  Location: [facility] ENDOSCOPY;  Service: Endoscopy;  Laterality: N/A;   EYE SURGERY     PARS PLANA VITRECTOMY Left 10/11/2014   Procedure: PARS PLANA VITRECTOMY WITH 25 GAUGE,CATARACT EXTRACTION WITH IOL INSERTION , ENDOLASER;  Surgeon: Milus Height, MD;  Location: [facility] ORS;  Service: Ophthalmology;  Laterality: Left;  [place] AP5  CDE CASETTE LOT # [id number]   SHOULDER SURGERY     TONSILLECTOMY     TONSILLECTOMY      Family History  Problem Relation Age of Onset   Thyroid disease Mother    Breast cancer Mother 24   Hypertension Mother    Depression Mother    Anxiety disorder Mother    Colon polyps Mother    Breast cancer Maternal Aunt 106   Cancer Maternal Aunt    Breast cancer Cousin 11       maternal side    Social History:  reports that she has never smoked. She has never used smokeless tobacco. She reports that she does not drink alcohol and does not use drugs.  Review of Systems:  HYPERTENSION:  she has had long-standing hypertension treated with amlodipine and  Benicar 20 mg  Blood pressure well controlled   Recent home reading 128/65  BP Readings from Last 3 Encounters:  08/19/21 (!) 138/50  04/22/21 (!) 179/73  04/15/21 138/60    Has history of hyperlipidemia   She is taking pravastatin 40 mg as before with good control   Lab Results  Component Value Date   CHOL 141 04/15/2021   HDL 52.60 04/15/2021   LDLCALC 66 04/15/2021   LDLDIRECT 88.8 12/22/2013   TRIG 111.0 04/15/2021   CHOLHDL 3 04/15/2021    She has had mild long-standing depression controlled with Paxil and this was prescribed by psychiatrist  No symptoms of numbness or tingling in her feet Foot exam 3/23  Eye exam 1/22     Examination:   BP (!) 138/50   Pulse 83   Ht 5' 2"  (1.575 m)   Wt 154 lb (69.9 kg)   SpO2 99%   BMI 28.17 kg/m   Body mass index is 28.17 kg/m.     ASSESSMENT/ PLAN:   Diabetes type 1 on basal bolus insulin  See history of present illness for detailed discussion of current diabetes management, blood sugar patterns and problems identified   A1c is 9.2 again  Her blood sugars are significantly higher in the afternoons and this is unclear whether it is related to inadequate basal or bolus insulin for morning and lunchtime doses respectively Postprandial monitoring has been inadequate  May be still getting relatively higher fat or carbohydrate meals and regular soft drinks causing high sugars also as before  She is still not able to afford CGM and also complaining over the cost of her insulin  RECOMMENDATIONS: She will start checking her blood sugars consistently 2 hours after meals by rotation She can cut back on monitoring  in the morning If her blood sugars are higher after breakfast or lunch she will increase her mealtime coverage for those meals However we can first try increasing her basal insulin by 4 units in the morning while reducing evening dose by 2 units This will help her blood sugars in the afternoon and avoid  overnight hypoglycemia She is going to try and check on the cost of Toujeo or Semglee, co-pay card for Toujeo given Although she is wanting to try the off label [place] glucose monitoring watch discussed that this is not officially approved and if she wants to try and check her mother's device for accuracy okay to do so  HYPERTENSION: Her blood pressure is  well-controlled Continue current regimen  She needs to continue taking pravastatin, to have follow-up labs with her PCP  Patient Instructions  Basaglar 24 in am, check copay card on line Basaglar 28  Check on Semglee insulin  Check blood sugars on waking up 3 days a week  Also check blood sugars about 2 hours after meals and do this after different meals by rotation  Recommended blood sugar levels on waking up are 90-130 and about 2 hours after meal is 130-160  Please bring your blood sugar monitor to each visit, thank you      Elayne Snare 08/19/2021, 2:46 PM    Labs: All ok

## 2021-09-11 ENCOUNTER — Other Ambulatory Visit: Payer: Self-pay | Admitting: Endocrinology

## 2021-09-11 ENCOUNTER — Encounter: Payer: Self-pay | Admitting: Endocrinology

## 2021-09-11 DIAGNOSIS — E10649 Type 1 diabetes mellitus with hypoglycemia without coma: Secondary | ICD-10-CM

## 2021-09-11 MED ORDER — INSULIN LISPRO (1 UNIT DIAL) 100 UNIT/ML (KWIKPEN): PEN_INJECTOR | SUBCUTANEOUS | 3 refills | Status: DC

## 2021-09-12 ENCOUNTER — Other Ambulatory Visit (HOSPITAL_COMMUNITY): Payer: Self-pay

## 2021-09-12 ENCOUNTER — Telehealth: Payer: Self-pay

## 2021-09-12 NOTE — Telephone Encounter (Signed)
Patient Advocate Encounter   Received notification from CoverMyMeds that prior authorization is required for Humalog Kwikpen. Insurance rejected PA as brand name is preferred on this plan.  [place] to process; DAW 2 with Humalog copay assist card produces $70.00 copay. Lilly has an eVoucher that is being applied at [employer] resulting in a $35 copay for generic. Obtained copy of Lilly Savings card to send to pharmacy, faxed to [place].  Will update this encounter when price is confirmed.  Clista Bernhardt, CPhT Rx Patient Advocate Specialist Phone: [phone]

## 2021-09-13 MED ORDER — INSULIN LISPRO (1 UNIT DIAL) 100 UNIT/ML (KWIKPEN): PEN_INJECTOR | SUBCUTANEOUS | 3 refills | Status: DC

## 2021-09-13 NOTE — Telephone Encounter (Signed)
Patient Advocate Copy. They are unable to successfully process the savings card provided. Pharmacy is requesting new rx for a 30 day supply only (anything beyond 30 days charges additional copay)  The Lilly savings voucher should be automatically applied at most chain locations; patient should be able to obtain Insulin from [facility], [facility], [facility], etc for the $35 copay without needing to adjust the rx.  Clista Bernhardt, CPhT Rx Patient Advocate Specialist Phone: [phone]

## 2021-09-13 NOTE — Telephone Encounter (Signed)
Noted  

## 2021-09-13 NOTE — Addendum Note (Signed)
Addended by: Cinda Quest on: 09/13/2021 04:08 PM   Modules accepted: Orders

## 2021-10-21 ENCOUNTER — Other Ambulatory Visit: Payer: Self-pay | Admitting: Endocrinology

## 2021-10-21 DIAGNOSIS — E10649 Type 1 diabetes mellitus with hypoglycemia without coma: Secondary | ICD-10-CM

## 2021-11-25 LAB — HM DIABETES EYE EXAM

## 2021-12-02 ENCOUNTER — Encounter: Payer: Self-pay | Admitting: Endocrinology

## 2021-12-16 ENCOUNTER — Ambulatory Visit: Payer: BLUE CROSS/BLUE SHIELD | Admitting: Dermatology

## 2022-01-09 ENCOUNTER — Other Ambulatory Visit: Payer: Self-pay | Admitting: Endocrinology

## 2022-01-09 DIAGNOSIS — E10649 Type 1 diabetes mellitus with hypoglycemia without coma: Secondary | ICD-10-CM

## 2022-01-10 ENCOUNTER — Ambulatory Visit
Admission: EM | Admit: 2022-01-10 | Discharge: 2022-01-10 | Disposition: A | Discharge status: Home / Self Care | Payer: BLUE CROSS/BLUE SHIELD | Attending: Emergency Medicine | Admitting: Emergency Medicine

## 2022-01-10 ENCOUNTER — Telehealth: Payer: Self-pay

## 2022-01-10 DIAGNOSIS — I10 Essential (primary) hypertension: Secondary | ICD-10-CM

## 2022-01-10 DIAGNOSIS — U071 COVID-19: Secondary | ICD-10-CM | POA: Diagnosis not present

## 2022-01-10 LAB — RESP PANEL BY RT-PCR (FLU A&B, COVID) ARPGX2
Influenza A by PCR: NEGATIVE
Influenza B by PCR: NEGATIVE
SARS Coronavirus 2 by RT PCR: NEGATIVE

## 2022-01-10 MED ORDER — MOLNUPIRAVIR EUA 200MG CAPSULE: 4.0000 | ORAL_CAPSULE | Freq: Two times a day (BID) | ORAL | 0 refills | Status: AC

## 2022-01-10 MED ORDER — MOLNUPIRAVIR EUA 200MG CAPSULE: 4.0000 | ORAL_CAPSULE | Freq: Two times a day (BID) | ORAL | 0 refills | Status: DC

## 2022-01-10 NOTE — Discharge Instructions (Addendum)
Take the molnupiravir as directed.  Follow up with your primary care provider.    Your blood pressure is elevated today at 166/67.  Please have this rechecked by your primary care provider in 2-4 weeks.

## 2022-01-10 NOTE — ED Provider Notes (Signed)
[facility]    CSN: [id number] Arrival date & time: 01/10/22  1213      History   Chief Complaint Chief Complaint  Patient presents with   Fatigue   Generalized Body Aches    HPI Diana Dixon is a 61 y.o. female.   Patient presents with body aches, fatigue, generalized weakness, headache, decreased appetite, mild cough; Onset of symptoms 01/07/2022.  She tested positive for COVID test at home last night but test was expired so patient is not sure if accurate.  She denies fever, sore throat, chest pain, shortness of breath, vomiting, diarrhea, or other symptoms.  Her medical history includes hypertension and diabetes.   The history is provided by the patient and medical records.    Past Medical History:  Diagnosis Date   Allergic rhinitis 05/20/2019   Anxiety    Depression    Diabetes mellitus type I ([place])    Diabetes mellitus without complication ([place])    Type I   Endometriosis    Family history of adverse reaction to anesthesia    Mother - PONV   Fibroid    Generalized anxiety disorder    GERD (gastroesophageal reflux disease)    Heart murmur    History of hepatitis C virus infection 20 years ago   She can't recall but did get treated for HCV and it was resolved.    Hypertension    Major depressive disorder, single episode, moderate (HCC)    PONV (postoperative nausea and vomiting)     Patient Active Problem List   Diagnosis Date Noted   Allergic rhinitis 05/20/2019   Hypercholesterolemia 01/28/2017   Anxiety 06/06/2014   Depression, major, single episode, moderate ([place]) 06/06/2014   Depressive disorder 06/06/2014   Insulin dependent type 1 diabetes mellitus ([place]) 06/06/2014   H/O gastrointestinal disease 06/06/2014   Anxiety, generalized 06/06/2014   H/O: HTN (hypertension) 06/06/2014   Vaginal vault smear abnormal 06/06/2014   Essential hypertension 09/21/2012   Depression 09/21/2012   Uncontrolled type 1 diabetes mellitus 09/15/2012     Past Surgical History:  Procedure Laterality Date   ABDOMINAL HYSTERECTOMY     CATARACT EXTRACTION W/PHACO Right 05/07/2021   Procedure: CATARACT EXTRACTION PHACO AND INTRAOCULAR LENS PLACEMENT (IOC) RIGHT DIABETIC 5.83 00:47.9;  Surgeon: Birder Robson, MD;  Location: [place];  Service: Ophthalmology;  Laterality: Right;  Diabetic   COLONOSCOPY WITH PROPOFOL N/A 09/27/2019   Procedure: COLONOSCOPY WITH PROPOFOL;  Surgeon: Jonathon Bellows, MD;  Location: [facility] ENDOSCOPY;  Service: Gastroenterology;  Laterality: N/A;   COLONOSCOPY WITH PROPOFOL N/A 03/30/2020   Procedure: COLONOSCOPY WITH PROPOFOL;  Surgeon: Robert Bellow, MD;  Location: [facility] ENDOSCOPY;  Service: Endoscopy;  Laterality: N/A;   EYE SURGERY     PARS PLANA VITRECTOMY Left 10/11/2014   Procedure: PARS PLANA VITRECTOMY WITH 25 GAUGE,CATARACT EXTRACTION WITH IOL INSERTION , ENDOLASER;  Surgeon: Milus Height, MD;  Location: [facility];  Service: Ophthalmology;  Laterality: Left;  [place] AP5  CDE CASETTE LOT # [id number] h   SHOULDER SURGERY     TONSILLECTOMY     TONSILLECTOMY      OB History   No obstetric history on file.      Home Medications    Prior to Admission medications   Medication Sig Start Date End Date Taking? Authorizing Provider  molnupiravir EUA (LAGEVRIO) 200 mg CAPS capsule Take 4 capsules (800 mg total) by mouth 2 (two) times daily for 5 days. 01/10/22 01/15/22 Yes Sharion Balloon, NP  amLODipine (NORVASC) 5 MG tablet Take 1 tablet (5 mg total) by mouth daily. 04/11/21   Kumar, Ajay, MD  aspirin EC 81 MG tablet Take 81 mg by mouth daily. Swallow whole.    [provider]  Blood Glucose Monitoring Suppl (FREESTYLE LITE) DEVI Use 1-4 times daily as needed DX EE10.649 02/24/20   Kumar, Ajay, MD  Blood Glucose Monitoring Suppl (FREESTYLE LITE) w/Device KIT 1 each by Does not apply route in the morning and at bedtime. 02/23/20   Kumar, Ajay, MD  cetirizine (ZYRTEC) 10 MG tablet TAKE 1 TABLET BY  MOUTH ONCE A DAY 01/23/21   Tullo, Teresa L, MD  Continuous Blood Gluc Sensor (FREESTYLE LIBRE 3 SENSOR) MISC 1 Device by Does not apply route every 14 (fourteen) days. Apply 1 sensor on upper arm every 14 days for continuous glucose monitoring Patient not taking: Reported on 08/19/2021 04/15/21   Kumar, Ajay, MD  hydrocortisone 2.5 % cream  05/16/19   [provider]  Insulin Glargine (BASAGLAR KWIKPEN) 100 UNIT/ML INJECT 20 UNITS IN THE MORNING AND 30 UNITS IN THE EVENING 10/21/21   Kumar, Ajay, MD  insulin lispro (HUMALOG KWIKPEN) 100 UNIT/ML KwikPen INJECT 11 TO 14 UNITS UNDER THE SKIN 3 TIMES DAILY 01/09/22   Kumar, Ajay, MD  Insulin Pen Needle (B-D ULTRAFINE III SHORT PEN) 31G X 8 MM MISC USE TO INJECT INSULIN AS DIRECTED 4 TIMES DAILY USE TO INJECT INSULIN AS DIRECTED 4 TIMES DAILY Strength: 32G X 4 MM 06/03/21   Kumar, Ajay, MD  metoCLOPramide (REGLAN) 5 MG tablet Take 5 mg by mouth 4 (four) times daily. Patient not taking: Reported on 04/30/2021    [provider]  Microlet Lancets MISC Use 1-4 times daily as needed DX EE10.649 02/24/20   Kumar, Ajay, MD  olmesartan (BENICAR) 20 MG tablet Take 1 tablet (20 mg total) by mouth daily. 04/11/21   Kumar, Ajay, MD  pantoprazole (PROTONIX) 40 MG tablet Take 1 tablet (40 mg total) by mouth daily. 05/30/20   Kumar, Ajay, MD  PARoxetine (PAXIL) 20 MG tablet TAKE 1 AND 1/2 TABLETS BY MOUTH DAILY 01/23/21   Kumar, Ajay, MD  pravastatin (PRAVACHOL) 40 MG tablet Take 1 tablet (40 mg total) by mouth at bedtime. 04/11/21   Kumar, Ajay, MD  triamcinolone cream (KENALOG) 0.1 % Apply 1 application. topically 2 (two) times daily. Apply lightly bid x 7-10 days 04/22/21   Rodriguez-Southworth, Sylvia, PA-C  valACYclovir (VALTREX) 500 MG tablet Take 500 mg by mouth 2 (two) times daily. Patient not taking: Reported on 04/30/2021    [provider]    Family History Family History  Problem Relation Age of Onset   Thyroid disease Mother    Breast  cancer Mother 38   Hypertension Mother    Depression Mother    Anxiety disorder Mother    Colon polyps Mother    Breast cancer Maternal Aunt 55   Cancer Maternal Aunt    Breast cancer Cousin 55       maternal side    Social History Social History   Tobacco Use   Smoking status: Never   Smokeless tobacco: Never  Vaping Use   Vaping Use: Never used  Substance Use Topics   Alcohol use: No    Alcohol/week: 0.0 standard drinks of alcohol   Drug use: No     Allergies   Penicillins and Sulfa antibiotics   Review of Systems Review of Systems  Constitutional:  Positive for appetite change and   fatigue. Negative for chills and fever.  HENT:  Negative for ear pain and sore throat.   Eyes:  Negative for pain and visual disturbance.  Respiratory:  Positive for cough. Negative for shortness of breath.   Cardiovascular:  Negative for chest pain and palpitations.  Gastrointestinal:  Negative for diarrhea and vomiting.  Skin:  Negative for color change and rash.  Neurological:  Positive for headaches.  All other systems reviewed and are negative.    Physical Exam Triage Vital Signs ED Triage Vitals  Enc Vitals Group     BP 01/10/22 1329 (!) 166/67     Pulse Rate 01/10/22 1323 79     Resp 01/10/22 1323 18     Temp 01/10/22 1323 98.4 F (36.9 C)     Temp src --      SpO2 01/10/22 1323 96 %     Weight 01/10/22 1328 154 lb (69.9 kg)     Height 01/10/22 1328 5' 2" (1.575 m)     Head Circumference --      Peak Flow --      Pain Score 01/10/22 1326 3     Pain Loc --      Pain Edu? --      Excl. in GC? --    No data found.  Updated Vital Signs BP (!) 166/67   Pulse 79   Temp 98.4 F (36.9 C)   Resp 18   Ht 5' 2" (1.575 m)   Wt 154 lb (69.9 kg)   SpO2 96%   BMI 28.17 kg/m   Visual Acuity Right Eye Distance:   Left Eye Distance:   Bilateral Distance:    Right Eye Near:   Left Eye Near:    Bilateral Near:     Physical Exam Vitals and nursing note reviewed.   Constitutional:      General: She is not in acute distress.    Appearance: Normal appearance. She is well-developed. She is not ill-appearing.  HENT:     Right Ear: Tympanic membrane normal.     Left Ear: Tympanic membrane normal.     Nose: Nose normal.     Mouth/Throat:     Mouth: Mucous membranes are moist.     Pharynx: Oropharynx is clear.  Cardiovascular:     Rate and Rhythm: Normal rate and regular rhythm.     Heart sounds: Normal heart sounds.  Pulmonary:     Effort: Pulmonary effort is normal. No respiratory distress.     Breath sounds: Normal breath sounds.  Musculoskeletal:     Cervical back: Neck supple.  Skin:    General: Skin is warm and dry.  Neurological:     Mental Status: She is alert.  Psychiatric:        Mood and Affect: Mood normal.        Behavior: Behavior normal.      UC Treatments / Results  Labs (all labs ordered are listed, but only abnormal results are displayed) Labs Reviewed  RESP PANEL BY RT-PCR (FLU A&B, COVID) [id number]    EKG   Radiology No results found.  Procedures Procedures (including critical care time)  Medications Ordered in UC Medications - No data to display  Initial Impression / Assessment and Plan / UC Course  I have reviewed the triage vital signs and the nursing notes.  Pertinent labs & imaging results that were available during my care of the patient were reviewed by me and considered in my medical decision making (  see chart for details).    Positive COVID test at home yesterday, COVID-19, elevated blood pressure with HTN.  Patient tested positive for COVID at home yesterday but the test was expired so she is not sure if it was accurate.  PCR COVID test here today.  No GFR in chart since March 2023.  Treating with molnupiravir.  Discussed that this is an emergency authorized medication for treatment of COVID.  Discussed side effects including nausea, diarrhea, dizziness.  Discussed other symptomatic treatment including  Tylenol, rest, hydration.  Instructed patient to follow-up with PCP if symptoms are not improving.  ED precautions discussed.  Patient agrees to plan of care.   Final Clinical Impressions(s) / UC Diagnoses   Final diagnoses:  Positive self-administered antigen test for COVID-19  COVID-19  Elevated blood pressure reading in office with diagnosis of hypertension     Discharge Instructions      Take the molnupiravir as directed.  Follow up with your primary care provider.    Your blood pressure is elevated today at 166/67.  Please have this rechecked by your primary care provider in 2-4 weeks.          ED Prescriptions     Medication Sig Dispense Auth. Provider   molnupiravir EUA (LAGEVRIO) 200 mg CAPS capsule Take 4 capsules (800 mg total) by mouth 2 (two) times daily for 5 days. 40 capsule Tate, Kelly H, NP      PDMP not reviewed this encounter.   Tate, Kelly H, NP 01/10/22 1414  

## 2022-01-10 NOTE — ED Triage Notes (Signed)
Patient to Urgent Care with complaints of generalized body aches, weakness, and fatigue x3 days. Headaches/ poor appetite.   Denies any known fevers.   Takes daily zyrtec.

## 2022-01-11 NOTE — Telephone Encounter (Signed)
Attempted to reach patient to discuss negative COVID result.  No answer to telephone call and mailbox is full so will not accept message.

## 2022-01-15 ENCOUNTER — Ambulatory Visit: Payer: BLUE CROSS/BLUE SHIELD | Admitting: Dermatology

## 2022-01-20 ENCOUNTER — Other Ambulatory Visit: Payer: Self-pay | Admitting: Endocrinology

## 2022-01-20 DIAGNOSIS — E10649 Type 1 diabetes mellitus with hypoglycemia without coma: Secondary | ICD-10-CM

## 2022-03-13 ENCOUNTER — Other Ambulatory Visit: Payer: Self-pay

## 2022-03-13 ENCOUNTER — Encounter: Payer: Self-pay | Admitting: Endocrinology

## 2022-03-13 DIAGNOSIS — E10649 Type 1 diabetes mellitus with hypoglycemia without coma: Secondary | ICD-10-CM

## 2022-03-13 MED ORDER — INSULIN LISPRO (1 UNIT DIAL) 100 UNIT/ML (KWIKPEN): PEN_INJECTOR | SUBCUTANEOUS | 3 refills | Status: DC

## 2022-03-14 ENCOUNTER — Telehealth: Payer: Self-pay

## 2022-03-14 NOTE — Telephone Encounter (Signed)
Are you all able to start the PA for the pts Humalog? Thank you!

## 2022-03-14 NOTE — Telephone Encounter (Signed)
Humalog needs PA per pharmacy.

## 2022-03-17 NOTE — Telephone Encounter (Signed)
Patient is calling saying that she only has 2-3 days left of insulin lispro insulin lispro (HUMALOG KWIKPEN) 100 UNIT/ML KwikPen

## 2022-03-18 ENCOUNTER — Other Ambulatory Visit (HOSPITAL_COMMUNITY): Payer: Self-pay

## 2022-03-18 MED ORDER — NOVOLOG FLEXPEN 100 UNIT/ML ~~LOC~~ SOPN: PEN_INJECTOR | SUBCUTANEOUS | 3 refills | Status: DC

## 2022-03-18 NOTE — Telephone Encounter (Signed)
Novolog has been sent

## 2022-03-18 NOTE — Telephone Encounter (Signed)
Mychart message sent to patient.

## 2022-03-18 NOTE — Telephone Encounter (Signed)
Dr. Dwyane Dee would you like to give patient a sample of a different medication. I sent high priority message to PA team but no response yet.

## 2022-03-18 NOTE — Telephone Encounter (Signed)
Script has been changed

## 2022-04-17 ENCOUNTER — Other Ambulatory Visit: Payer: Self-pay | Admitting: Endocrinology

## 2022-04-17 DIAGNOSIS — E10649 Type 1 diabetes mellitus with hypoglycemia without coma: Secondary | ICD-10-CM

## 2022-04-18 ENCOUNTER — Other Ambulatory Visit (HOSPITAL_COMMUNITY): Payer: Self-pay

## 2022-04-28 ENCOUNTER — Ambulatory Visit: Payer: BLUE CROSS/BLUE SHIELD | Admitting: Endocrinology

## 2022-05-06 ENCOUNTER — Other Ambulatory Visit (HOSPITAL_COMMUNITY): Payer: Self-pay

## 2022-05-12 ENCOUNTER — Ambulatory Visit: Payer: 59 | Admitting: Endocrinology

## 2022-05-12 ENCOUNTER — Encounter: Payer: Self-pay | Admitting: Endocrinology

## 2022-05-12 VITALS — BP 116/58 | HR 85 | Ht 62.0 in | Wt 150.0 lb

## 2022-05-12 DIAGNOSIS — E10649 Type 1 diabetes mellitus with hypoglycemia without coma: Secondary | ICD-10-CM

## 2022-05-12 DIAGNOSIS — I10 Essential (primary) hypertension: Secondary | ICD-10-CM | POA: Diagnosis not present

## 2022-05-12 DIAGNOSIS — E7800 Pure hypercholesterolemia, unspecified: Secondary | ICD-10-CM | POA: Diagnosis not present

## 2022-05-12 LAB — POCT GLYCOSYLATED HEMOGLOBIN (HGB A1C): Hemoglobin A1C: 8.7 % — AB (ref 4.0–5.6)

## 2022-05-12 LAB — COMPREHENSIVE METABOLIC PANEL
ALT: 15 U/L (ref 0–35)
AST: 18 U/L (ref 0–37)
Albumin: 4.4 g/dL (ref 3.5–5.2)
Alkaline Phosphatase: 107 U/L (ref 39–117)
BUN: 17 mg/dL (ref 6–23)
CO2: 25 mEq/L (ref 19–32)
Calcium: 9.7 mg/dL (ref 8.4–10.5)
Chloride: 107 mEq/L (ref 96–112)
Creatinine, Ser: 1.1 mg/dL (ref 0.40–1.20)
GFR: 54.28 mL/min — ABNORMAL LOW (ref >60.00)
Glucose, Bld: 51 mg/dL — ABNORMAL LOW (ref 70–99)
Potassium: 4.3 mEq/L (ref 3.5–5.1)
Sodium: 139 mEq/L (ref 135–145)
Total Bilirubin: 0.6 mg/dL (ref 0.2–1.2)
Total Protein: 7.3 g/dL (ref 6.0–8.3)

## 2022-05-12 LAB — LIPID PANEL
Cholesterol: 164 mg/dL (ref 0–200)
HDL: 48.6 mg/dL (ref >39.00)
LDL Cholesterol: 96 mg/dL (ref 0–99)
NonHDL: 115.49
Total CHOL/HDL Ratio: 3
Triglycerides: 95 mg/dL (ref 0.0–149.0)
VLDL: 19 mg/dL (ref 0.0–40.0)

## 2022-05-12 LAB — MICROALBUMIN / CREATININE URINE RATIO
Creatinine,U: 163.2 mg/dL
Microalb Creat Ratio: 1.9 mg/g (ref 0.0–30.0)
Microalb, Ur: 3.1 mg/dL — ABNORMAL HIGH (ref 0.0–1.9)

## 2022-05-12 NOTE — Patient Instructions (Signed)
Reduce Basaglar to 24 in pm also

## 2022-05-12 NOTE — Progress Notes (Signed)
Patient ID: Diana Dixon, female   DOB: November 30, 1960, 62 y.o.   MRN: [id number]   Reason for Appointment:  follow-up   History of Present Illness   Diagnosis: Type 1 DIABETES MELITUS, diagnosis made in 1967      She has had long-standing type 1 diabetes with persistently poor control because of variability in blood sugars and difficulty with various self-care issues She has been reluctant to do carbohydrate counting to adjust her insulin doses and has variable readings after meals especially lunch Previously has had times when she would forget her Lantus in the evening and also mealtime dose She may have done a little better with taking Lantus twice a day but does not try to adjust the doses on her own    Recent history:  Insulin regimen: Basaglar 24 units a.m.---28 units p.m., Novolog 6-8  units at breakfast and 9-10 units at lunch and supper   A1c slightly better at 8.7  Current blood sugar patterns and problems identified: Her blood sugars are quite variable with no specific trends but likely higher in the evening especially after dinner with not consistent pattern However she is recently getting more frequent hypoglycemia waking up Unclear why she is having more hypoglycemia Also getting some readings as high as 400 in the evenings She is not able to adjust her basal insulin herself and did not reduce it when she was starting to get low Again is getting unbalanced meals at breakfast and periodically regular soft drinks at lunch or dinner Have only 2 weeks worth of blood sugar data is available for review from her monitor, also this is programmed for the wrong time Still not using CGM   DIET: May have unbalanced meals like Pop tarts, waffles or breakfast cereal.  May have high fat meats at meals.   Is drinking regular soft drinks and sweet tea at meals, less at breakfast    Glucometer: Freestyle lite        Blood Glucose readings from monitor review   PRE-MEAL  Fasting Lunch Dinner Bedtime Overall  Glucose range: 88-246  204-400+    Mean/median:     219   POST-MEAL PC Breakfast PC Lunch PC Dinner  Glucose range:   ?  Mean/median:       Prior  PRE-MEAL Fasting Lunch Dinner Bedtime Overall  Glucose range: 88-294 67-376 187-491  67-491  Mean/median: 175    224   POST-MEAL PC Breakfast PC Lunch PC Dinner  Glucose range:  134-431 346  Mean/median:  275     Meals: 3 meals per day.  Eating eggs/meat for breakfast frequently without carbohydrate; dinner 5-6 pm           Wt Readings from Last 3 Encounters:  05/12/22 150 lb (68 kg)  01/10/22 154 lb (69.9 kg)  08/19/21 154 lb (69.9 kg)     Lab Results  Component Value Date   HGBA1C 8.7 (A) 05/12/2022   HGBA1C 9.2 (A) 08/19/2021   HGBA1C 9.2 (A) 04/15/2021   Lab Results  Component Value Date   MICROALBUR 3.2 (H) 04/15/2021   LDLCALC 66 04/15/2021   CREATININE 0.96 04/15/2021    Lab Results  Component Value Date   MICROALBUR 3.2 (H) 04/15/2021    Allergies as of 05/12/2022       Reactions   Penicillins Nausea And Vomiting, Rash   Sulfa Antibiotics Rash        Medication List  Accurate as of May 12, 2022  4:15 PM. If you have any questions, ask your nurse or doctor.          STOP taking these medications    FreeStyle Lite Devi Stopped by: Elayne Snare, MD   FreeStyle Lite w/Device Kit Stopped by: Elayne Snare, MD   insulin lispro 100 UNIT/ML KwikPen Commonly known as: HumaLOG KwikPen Stopped by: Elayne Snare, MD       TAKE these medications    amLODipine 5 MG tablet Commonly known as: NORVASC Take 1 tablet (5 mg total) by mouth daily.   aspirin EC 81 MG tablet Take 81 mg by mouth daily. Swallow whole.   B-D ULTRAFINE III SHORT PEN 31G X 8 MM Misc Generic drug: Insulin Pen Needle USE TO INJECT INSULIN AS DIRECTED 4 TIMES DAILY USE TO INJECT INSULIN AS DIRECTED 4 TIMES DAILY Strength: 32G X 4 MM   Basaglar KwikPen 100 UNIT/ML Inject 24 units in  the morning and 28 units in the evening   cetirizine 10 MG tablet Commonly known as: ZYRTEC TAKE 1 TABLET BY MOUTH ONCE A DAY   FreeStyle Libre 3 Sensor Misc 1 Device by Does not apply route every 14 (fourteen) days. Apply 1 sensor on upper arm every 14 days for continuous glucose monitoring   hydrocortisone 2.5 % cream   metoCLOPramide 5 MG tablet Commonly known as: REGLAN Take 5 mg by mouth 4 (four) times daily.   Microlet Lancets Misc Use 1-4 times daily as needed DX EE10.649   NovoLOG FlexPen 100 UNIT/ML FlexPen Generic drug: insulin aspart INJECT 11 TO 14 UNITS UNDER THE SKIN 3 TIMES DAILY   olmesartan 20 MG tablet Commonly known as: BENICAR Take 1 tablet (20 mg total) by mouth daily.   pantoprazole 40 MG tablet Commonly known as: PROTONIX Take 1 tablet (40 mg total) by mouth daily.   PARoxetine 20 MG tablet Commonly known as: PAXIL TAKE 1 AND 1/2 TABLETS BY MOUTH DAILY   pravastatin 40 MG tablet Commonly known as: PRAVACHOL Take 1 tablet (40 mg total) by mouth at bedtime.   triamcinolone cream 0.1 % Commonly known as: KENALOG Apply 1 application. topically 2 (two) times daily. Apply lightly bid x 7-10 days   valACYclovir 500 MG tablet Commonly known as: VALTREX Take 500 mg by mouth 2 (two) times daily.        Allergies:  Allergies  Allergen Reactions   Penicillins Nausea And Vomiting and Rash   Sulfa Antibiotics Rash    Past Medical History:  Diagnosis Date   Allergic rhinitis 05/20/2019   Anxiety    Depression    Diabetes mellitus type I    Diabetes mellitus without complication    Type I   Endometriosis    Family history of adverse reaction to anesthesia    Mother - PONV   Fibroid    Generalized anxiety disorder    GERD (gastroesophageal reflux disease)    Heart murmur    History of hepatitis C virus infection 20 years ago   She can't recall but did get treated for HCV and it was resolved.    Hypertension    Major depressive disorder,  single episode, moderate    PONV (postoperative nausea and vomiting)     Past Surgical History:  Procedure Laterality Date   ABDOMINAL HYSTERECTOMY     CATARACT EXTRACTION W/PHACO Right 05/07/2021   Procedure: CATARACT EXTRACTION PHACO AND INTRAOCULAR LENS PLACEMENT (IOC) RIGHT DIABETIC 5.83 00:47.9;  Surgeon: Birder Robson,  MD;  Location: [place];  Service: Ophthalmology;  Laterality: Right;  Diabetic   COLONOSCOPY WITH PROPOFOL N/A 09/27/2019   Procedure: COLONOSCOPY WITH PROPOFOL;  Surgeon: Jonathon Bellows, MD;  Location: [facility] ENDOSCOPY;  Service: Gastroenterology;  Laterality: N/A;   COLONOSCOPY WITH PROPOFOL N/A 03/30/2020   Procedure: COLONOSCOPY WITH PROPOFOL;  Surgeon: Robert Bellow, MD;  Location: [facility] ENDOSCOPY;  Service: Endoscopy;  Laterality: N/A;   EYE SURGERY     PARS PLANA VITRECTOMY Left 10/11/2014   Procedure: PARS PLANA VITRECTOMY WITH 25 GAUGE,CATARACT EXTRACTION WITH IOL INSERTION , ENDOLASER;  Surgeon: Milus Height, MD;  Location: [facility];  Service: Ophthalmology;  Laterality: Left;  [place] AP5  CDE CASETTE LOT # [id number] h   SHOULDER SURGERY     TONSILLECTOMY     TONSILLECTOMY      Family History  Problem Relation Age of Onset   Thyroid disease Mother    Breast cancer Mother 29   Hypertension Mother    Depression Mother    Anxiety disorder Mother    Colon polyps Mother    Breast cancer Maternal Aunt 59   Cancer Maternal Aunt    Breast cancer Cousin 62       maternal side    Social History:  reports that she has never smoked. She has never used smokeless tobacco. She reports that she does not drink alcohol and does not use drugs.  Review of Systems:  HYPERTENSION:  she has had long-standing hypertension treated with amlodipine and Benicar 20 mg  Blood pressure well controlled   Recent home readings 120s/65 usually  BP Readings from Last 3 Encounters:  05/12/22 (!) 116/58  01/10/22 (!) 166/67  08/19/21 (!) 138/50    Has history of  hyperlipidemia   She is taking pravastatin 40 mg, she thinks she has had labs from PCP but no results available for some time She was told by her PCP that her carotid Doppler showed some mild blockages, no details available  Lab Results  Component Value Date   CHOL 141 04/15/2021   HDL 52.60 04/15/2021   LDLCALC 66 04/15/2021   LDLDIRECT 88.8 12/22/2013   TRIG 111.0 04/15/2021   CHOLHDL 3 04/15/2021    She has had mild long-standing depression controlled with Paxil and this was prescribed by psychiatrist  No symptoms of numbness or tingling in her feet  Foot exam 4/24  Eye exam 1/22     Examination:   BP (!) 116/58   Pulse 85   Ht 5\' 2"  (1.575 m)   Wt 150 lb (68 kg)   SpO2 96%   BMI 27.44 kg/m   Body mass index is 27.44 kg/m.   Diabetic Foot Exam - Simple   Simple Foot Form Diabetic Foot exam was performed with the following findings: Yes   Visual Inspection No deformities, no ulcerations, no other skin breakdown bilaterally: Yes Sensation Testing Intact to touch and monofilament testing bilaterally: Yes Pulse Check Posterior Tibialis and Dorsalis pulse intact bilaterally: Yes Comments      ASSESSMENT/ PLAN:  Diabetes type 1 on basal bolus insulin  See history of present illness for detailed discussion of current diabetes management, blood sugar patterns and problems identified   A1c is 8.7  Her blood sugars are quite variable with no specific trends but likely higher in the evening especially after dinner with not consistent pattern However she is recently getting more frequent hypoglycemia waking up Unclear why She is not able to adjust her basal insulin herself  As usual likely is still getting unbalanced meals at breakfast and some regular soft drinks at lunch or dinner  Have only 2 weeks worth of blood sugar data is available for review  Unclear whether her insurance will cover the freestyle libre now  RECOMMENDATIONS: She will need more regular  follow-up Given prescription for freestyle libre version 3 and she can use the phone app for this Discussed need to check readings after meals and blood sugar target of 180 Reduce Basaglar to 24 units in the evening to avoid more low sugars in the morning Take adequate amounts of insulin for higher carbohydrate meals at dinnertime Likely needs to be admitted to all regular soft drinks  HYPERTENSION: Her blood pressure is under fair control Continue current regimen and also monitor her at home  She needs to continue taking pravastatin, to have follow-up labs  Consider increasing the dose or switching if she has high normal LDL levels Is reportedly having some atherosclerosis in her carotid  Patient Instructions  Reduce Basaglar to 24 in pm also     Meckenzie Balsley 05/12/2022, 4:15 PM    Labs: LDL 96 will increase pravastatin to 80 mg

## 2022-05-13 DIAGNOSIS — E1065 Type 1 diabetes mellitus with hyperglycemia: Secondary | ICD-10-CM

## 2022-05-13 DIAGNOSIS — E7800 Pure hypercholesterolemia, unspecified: Secondary | ICD-10-CM

## 2022-05-15 ENCOUNTER — Other Ambulatory Visit: Payer: Self-pay | Admitting: *Deleted

## 2022-05-15 MED ORDER — BD PEN NEEDLE SHORT U/F 31G X 8 MM MISC
3 refills | Status: DC
Start: 2022-05-15 — End: 2022-05-16

## 2022-05-15 MED ORDER — PRAVASTATIN SODIUM 80 MG PO TABS
80.0000 mg | ORAL_TABLET | Freq: Every day | ORAL | 1 refills | Status: DC
Start: 2022-05-15 — End: 2023-03-10

## 2022-05-15 MED ORDER — FREESTYLE LITE TEST VI STRP
ORAL_STRIP | 3 refills | Status: DC
Start: 2022-05-15 — End: 2022-05-16

## 2022-05-15 MED ORDER — NOVOLOG FLEXPEN 100 UNIT/ML ~~LOC~~ SOPN
PEN_INJECTOR | SUBCUTANEOUS | 3 refills | Status: DC
Start: 2022-05-15 — End: 2022-12-24

## 2022-05-16 MED ORDER — ACCU-CHEK GUIDE W/DEVICE KIT
PACK | 0 refills | Status: DC
Start: 2022-05-16 — End: 2023-12-14

## 2022-05-16 MED ORDER — INSULIN PEN NEEDLE 32G X 4 MM MISC
1 refills | Status: DC
Start: 2022-05-16 — End: 2022-12-24

## 2022-05-16 MED ORDER — ACCU-CHEK SOFTCLIX LANCET DEV KIT
PACK | 0 refills | Status: AC
Start: 2022-05-16 — End: ?

## 2022-05-16 MED ORDER — ACCU-CHEK GUIDE VI STRP
ORAL_STRIP | 3 refills | Status: DC
Start: 2022-05-16 — End: 2023-11-26

## 2022-05-16 MED ORDER — ACCU-CHEK SOFTCLIX LANCETS MISC
3 refills | Status: AC
Start: 2022-05-16 — End: ?

## 2022-05-22 ENCOUNTER — Other Ambulatory Visit: Payer: Self-pay | Admitting: Obstetrics and Gynecology

## 2022-05-22 DIAGNOSIS — Z1231 Encounter for screening mammogram for malignant neoplasm of breast: Secondary | ICD-10-CM

## 2022-06-20 ENCOUNTER — Other Ambulatory Visit: Payer: Self-pay | Admitting: Endocrinology

## 2022-06-20 DIAGNOSIS — E10649 Type 1 diabetes mellitus with hypoglycemia without coma: Secondary | ICD-10-CM

## 2022-06-27 ENCOUNTER — Other Ambulatory Visit (HOSPITAL_COMMUNITY): Payer: Self-pay

## 2022-06-30 ENCOUNTER — Ambulatory Visit
Admission: RE | Admit: 2022-06-30 | Discharge: 2022-06-30 | Disposition: A | Discharge status: Home / Self Care | Payer: 59 | Source: Ambulatory Visit | Attending: Obstetrics and Gynecology | Admitting: Obstetrics and Gynecology

## 2022-06-30 DIAGNOSIS — Z1231 Encounter for screening mammogram for malignant neoplasm of breast: Secondary | ICD-10-CM

## 2022-10-06 ENCOUNTER — Ambulatory Visit: Payer: 59 | Admitting: Endocrinology

## 2022-10-06 ENCOUNTER — Encounter: Payer: Self-pay | Admitting: Endocrinology

## 2022-10-06 VITALS — BP 136/70 | HR 80 | Ht 62.0 in | Wt 154.6 lb

## 2022-10-06 DIAGNOSIS — E1065 Type 1 diabetes mellitus with hyperglycemia: Secondary | ICD-10-CM | POA: Diagnosis not present

## 2022-10-06 DIAGNOSIS — I10 Essential (primary) hypertension: Secondary | ICD-10-CM

## 2022-10-06 DIAGNOSIS — E7800 Pure hypercholesterolemia, unspecified: Secondary | ICD-10-CM

## 2022-10-06 LAB — POCT GLYCOSYLATED HEMOGLOBIN (HGB A1C): Hemoglobin A1C: 9.3 % — AB (ref 4.0–5.6)

## 2022-10-06 MED ORDER — DEXCOM G7 SENSOR MISC
1.0000 | 3 refills | Status: DC
Start: 2022-10-06 — End: 2022-10-09

## 2022-10-06 NOTE — Patient Instructions (Signed)
Take 26 in am and 30 in pm for now on Basaglar  Take enough Novolog  to keep after meal sgar <180

## 2022-10-06 NOTE — Progress Notes (Signed)
Patient ID: Diana Dixon, female   DOB: [birth year], 62 y.o.   MRN: [id number]   Reason for Appointment:  follow-up   History of Present Illness   Diagnosis: Type 1 DIABETES MELITUS, diagnosis made in 1967      She has had long-standing type 1 diabetes with persistently poor control because of variability in blood sugars and difficulty with various self-care issues She has been reluctant to do carbohydrate counting to adjust her insulin doses and has variable readings after meals especially lunch Previously has had times when she would forget her Lantus in the evening and also mealtime dose She may have done a little better with taking Lantus twice a day but does not try to adjust the doses on her own    Recent history:  Insulin regimen: Basaglar 24 units a.m.---28 units p.m., Novolog 6-8  units at breakfast and 9-10 units at lunch and supper   A1c is back up to 9.3  Current blood sugar patterns and problems identified: Her blood sugars are mostly over 200 with only occasional readings that are lower Fasting readings are recently frequently over 200 with only a couple of readings in the 150s Unable to download her meter and blood sugars were reviewed from her Accu-Chek review Recently has not had any hypoglycemia She is only adjusting her Basaglar in the morning based on higher readings at the time of injection and not consistently She is also not usually adjusting her NovoLog based on what she is eating and at the most may take 2 units more when blood sugar is well over 200 She is usually liking cereal in the morning for breakfast and does not want to change Occasionally will eat fast food and likely this makes her blood sugar go up higher with highest reading 570 on Saturday Still not using CGM because of cost Is still periodically drinking regular soft drinks and sweet tea at meals, less at times and is trying to use lower sugar drinks   DIET: May have unbalanced  meals like Pop tarts, waffles or breakfast cereal.  May have high fat meats at meals.      Glucometer: Accu-Chek        Blood Glucose readings from monitor review   PRE-MEAL Fasting Lunch Dinner Bedtime Overall  Glucose range: 70-292 1 58-223 251, 570 226   Mean/median:     232    Previously:  PRE-MEAL Fasting Lunch Dinner Bedtime Overall  Glucose range: 88-246  204-400+    Mean/median:     219   POST-MEAL PC Breakfast PC Lunch PC Dinner  Glucose range:   ?  Mean/median:        Meals: 3 meals per day.  Eating eggs/meat for breakfast frequently without carbohydrate; dinner 5-6 pm           Wt Readings from Last 3 Encounters:  10/06/22 154 lb 9.6 oz (70.1 kg)  05/12/22 150 lb (68 kg)  01/10/22 154 lb (69.9 kg)     Lab Results  Component Value Date   HGBA1C 9.3 (A) 10/06/2022   HGBA1C 8.7 (A) 05/12/2022   HGBA1C 9.2 (A) 08/19/2021   Lab Results  Component Value Date   MICROALBUR 3.1 (H) 05/12/2022   LDLCALC 96 05/12/2022   CREATININE 1.10 05/12/2022    Lab Results  Component Value Date   MICROALBUR 3.1 (H) 05/12/2022    Allergies as of 10/06/2022       Reactions   Penicillins Nausea And  Vomiting, Rash   Sulfa Antibiotics Rash        Medication List        Accurate as of October 06, 2022  1:39 PM. If you have any questions, ask your nurse or doctor.          Accu-Chek Guide test strip Generic drug: glucose blood Use as instructed to check blood sugar 4X daily   Accu-Chek Guide w/Device Kit Use as instructed to check blood sugar 4X daily   Accu-Chek Softclix Lancet Dev Kit Use as instructed to check blood sugar 4X daily   Accu-Chek Softclix Lancets lancets Use as instructed to check blood sugar 4X daily   amLODipine 5 MG tablet Commonly known as: NORVASC Take 1 tablet (5 mg total) by mouth daily.   aspirin EC 81 MG tablet Take 81 mg by mouth daily. Swallow whole.   Basaglar KwikPen 100 UNIT/ML INJECT 24 UNITS IN TO THE SKIN EVERY  MORNING AND 28 UNITS EVERY EVENING   cetirizine 10 MG tablet Commonly known as: ZYRTEC TAKE 1 TABLET BY MOUTH ONCE A DAY   FreeStyle Libre 3 Sensor Misc 1 Device by Does not apply route every 14 (fourteen) days. Apply 1 sensor on upper arm every 14 days for continuous glucose monitoring   hydrocortisone 2.5 % cream   Insulin Pen Needle 32G X 4 MM Misc USE TO INJECT INSULIN AS DIRECTED 4 TIMES DAILY   metoCLOPramide 5 MG tablet Commonly known as: REGLAN Take 5 mg by mouth 4 (four) times daily.   NovoLOG FlexPen 100 UNIT/ML FlexPen Generic drug: insulin aspart INJECT 11 TO 14 UNITS UNDER THE SKIN 3 TIMES DAILY   olmesartan 20 MG tablet Commonly known as: BENICAR Take 1 tablet (20 mg total) by mouth daily.   pantoprazole 40 MG tablet Commonly known as: PROTONIX Take 1 tablet (40 mg total) by mouth daily.   PARoxetine 20 MG tablet Commonly known as: PAXIL TAKE 1 AND 1/2 TABLETS BY MOUTH DAILY   pravastatin 80 MG tablet Commonly known as: PRAVACHOL Take 1 tablet (80 mg total) by mouth daily.   triamcinolone cream 0.1 % Commonly known as: KENALOG Apply 1 application. topically 2 (two) times daily. Apply lightly bid x 7-10 days   valACYclovir 500 MG tablet Commonly known as: VALTREX Take 500 mg by mouth 2 (two) times daily.        Allergies:  Allergies  Allergen Reactions   Penicillins Nausea And Vomiting and Rash   Sulfa Antibiotics Rash    Past Medical History:  Diagnosis Date   Allergic rhinitis 05/20/2019   Anxiety    Depression    Diabetes mellitus type I (HCC)    Diabetes mellitus without complication (HCC)    Type I   Endometriosis    Family history of adverse reaction to anesthesia    Mother - PONV   Fibroid    Generalized anxiety disorder    GERD (gastroesophageal reflux disease)    Heart murmur    History of hepatitis C virus infection 20 years ago   She can't recall but did get treated for HCV and it was resolved.    Hypertension    Major  depressive disorder, single episode, moderate (HCC)    PONV (postoperative nausea and vomiting)     Past Surgical History:  Procedure Laterality Date   ABDOMINAL HYSTERECTOMY     CATARACT EXTRACTION W/PHACO Right 05/07/2021   Procedure: CATARACT EXTRACTION PHACO AND INTRAOCULAR LENS PLACEMENT (IOC) RIGHT DIABETIC 5.83 00:47.9;  Surgeon: Galen Manila, MD;  Location: [facility];  Service: Ophthalmology;  Laterality: Right;  Diabetic   COLONOSCOPY WITH PROPOFOL N/A 09/27/2019   Procedure: COLONOSCOPY WITH PROPOFOL;  Surgeon: Wyline Mood, MD;  Location: [facility];  Service: Gastroenterology;  Laterality: N/A;   COLONOSCOPY WITH PROPOFOL N/A 03/30/2020   Procedure: COLONOSCOPY WITH PROPOFOL;  Surgeon: Earline Mayotte, MD;  Location: [facility] ENDOSCOPY;  Service: Endoscopy;  Laterality: N/A;   EYE SURGERY     PARS PLANA VITRECTOMY Left 10/11/2014   Procedure: PARS PLANA VITRECTOMY WITH 25 GAUGE,CATARACT EXTRACTION WITH IOL INSERTION , ENDOLASER;  Surgeon: Marcelene Butte, MD;  Location: [facility] ORS;  Service: Ophthalmology;  Laterality: Left;  [place] AP5  CDE CASETTE LOT # [id number]   SHOULDER SURGERY     TONSILLECTOMY     TONSILLECTOMY      Family History  Problem Relation Age of Onset   Thyroid disease Mother    Breast cancer Mother 17   Hypertension Mother    Depression Mother    Anxiety disorder Mother    Colon polyps Mother    Breast cancer Maternal Aunt 30   Cancer Maternal Aunt    Breast cancer Cousin 33       maternal side    Social History:  reports that she has never smoked. She has never used smokeless tobacco. She reports that she does not drink alcohol and does not use drugs.  Review of Systems:  HYPERTENSION:  she has had long-standing hypertension treated with amlodipine and Benicar 20 mg  Blood pressure well controlled   Recent home readings 120s/65 usually  BP Readings from Last 3 Encounters:  10/06/22 136/70  05/12/22 (!) 116/58  01/10/22 (!)  166/67    Has history of hyperlipidemia   She is taking pravastatin 80 mg She was told by her PCP that her carotid Doppler showed some mild blockages, no details available  Lab Results  Component Value Date   CHOL 164 05/12/2022   HDL 48.60 05/12/2022   LDLCALC 96 05/12/2022   LDLDIRECT 88.8 12/22/2013   TRIG 95.0 05/12/2022   CHOLHDL 3 05/12/2022    She has had mild long-standing depression controlled with Paxil and this was prescribed by psychiatrist  No symptoms of numbness or tingling in her feet  Foot exam 4/24  Eye exam 1/22     Examination:   BP 136/70 (BP Location: Left Arm, Patient Position: Sitting, Cuff Size: Large)   Pulse 80   Ht 5\' 2"  (1.575 m)   Wt 154 lb 9.6 oz (70.1 kg)   SpO2 95%   BMI 28.28 kg/m   Body mass index is 28.28 kg/m.   Diabetic Foot Exam - Simple   No data filed      ASSESSMENT/ PLAN:  Diabetes type 1 on basal bolus insulin  See history of present illness for detailed discussion of current diabetes management, blood sugar patterns and problems identified   A1c is 9.3  Her blood sugars are somewhat variable with inadequate monitoring and difficult to get a consistent pattern However blood sugars are generally higher at breakfast time and mostly higher the rest of the day  Likely needs more basal and bolus insulin at all times  Also can do better with diet and avoiding soft drinks consistently Currently have not had any data from CGM since freestyle Josephine Igo is not covered by insurance is also [employer]   RECOMMENDATIONS: She will need to try and get the Dexcom sensor which appears to be  covered and prescription sent, patient brochure given She will download the clarity app as shown on the brochure She will let us know if she has any difficulty starting the sensor Check blood sugars 3-4 times a day consistently Discussed blood sugar targets fasting and after meals Given titration sheet for adjusting her evening Basaglar based on  fasting readings with a target of at least under 140 For now she will take 26 Basaglar in the morning and 30 in the evening Take up to 12 or 14 units of NovoLog for larger meals, or anything high-fat Also when blood sugars are over 180 she will go up 2 to 4 units based on her blood sugars Try to eliminate all regular soft drinks  HYPERTENSION: Her blood pressure is under good control For now she will stay on the current regimen and also monitor blood pressure at home  Hyperlipidemia: To have labs with her PCP in October, currently on pravastatin with LDL below 100  There are no Patient Instructions on file for this visit.     Reather Littler 10/06/2022, 1:39 PM    Labs: LDL 96 will increase pravastatin to 80 mg

## 2022-10-08 ENCOUNTER — Encounter: Payer: Self-pay | Admitting: Endocrinology

## 2022-10-09 ENCOUNTER — Other Ambulatory Visit: Payer: Self-pay

## 2022-10-09 MED ORDER — DEXCOM G7 SENSOR MISC: 1.0000 | 3 refills | Status: DC

## 2022-10-10 ENCOUNTER — Other Ambulatory Visit: Payer: Self-pay | Admitting: Endocrinology

## 2022-10-10 DIAGNOSIS — E10649 Type 1 diabetes mellitus with hypoglycemia without coma: Secondary | ICD-10-CM

## 2022-11-06 ENCOUNTER — Ambulatory Visit: Payer: BLUE CROSS/BLUE SHIELD | Admitting: Dermatology

## 2022-12-23 ENCOUNTER — Encounter: Payer: Self-pay | Admitting: Endocrinology

## 2022-12-24 ENCOUNTER — Other Ambulatory Visit: Payer: Self-pay

## 2022-12-24 DIAGNOSIS — E10649 Type 1 diabetes mellitus with hypoglycemia without coma: Secondary | ICD-10-CM

## 2022-12-24 DIAGNOSIS — E1065 Type 1 diabetes mellitus with hyperglycemia: Secondary | ICD-10-CM

## 2022-12-24 MED ORDER — NOVOLOG FLEXPEN 100 UNIT/ML ~~LOC~~ SOPN: PEN_INJECTOR | SUBCUTANEOUS | 3 refills | Status: DC

## 2022-12-24 MED ORDER — INSULIN PEN NEEDLE 32G X 4 MM MISC: 1 refills | Status: DC

## 2022-12-24 MED ORDER — BASAGLAR KWIKPEN 100 UNIT/ML ~~LOC~~ SOPN: PEN_INJECTOR | SUBCUTANEOUS | 1 refills | Status: DC

## 2023-01-12 ENCOUNTER — Ambulatory Visit: Payer: 59 | Admitting: Endocrinology

## 2023-02-05 ENCOUNTER — Other Ambulatory Visit: Payer: Self-pay

## 2023-02-05 ENCOUNTER — Encounter: Payer: Self-pay | Admitting: Endocrinology

## 2023-02-05 DIAGNOSIS — E1065 Type 1 diabetes mellitus with hyperglycemia: Secondary | ICD-10-CM

## 2023-02-05 MED ORDER — NOVOLOG FLEXPEN 100 UNIT/ML ~~LOC~~ SOPN: PEN_INJECTOR | SUBCUTANEOUS | 3 refills | Status: DC

## 2023-02-26 ENCOUNTER — Other Ambulatory Visit: Payer: Self-pay | Admitting: Endocrinology

## 2023-02-26 DIAGNOSIS — E10649 Type 1 diabetes mellitus with hypoglycemia without coma: Secondary | ICD-10-CM

## 2023-03-09 ENCOUNTER — Encounter: Payer: Self-pay | Admitting: Endocrinology

## 2023-03-09 ENCOUNTER — Ambulatory Visit: Payer: Managed Care, Other (non HMO) | Admitting: Endocrinology

## 2023-03-09 VITALS — BP 128/60 | HR 77 | Resp 20 | Ht 62.0 in | Wt 157.8 lb

## 2023-03-09 DIAGNOSIS — E1065 Type 1 diabetes mellitus with hyperglycemia: Secondary | ICD-10-CM | POA: Diagnosis not present

## 2023-03-09 DIAGNOSIS — E7800 Pure hypercholesterolemia, unspecified: Secondary | ICD-10-CM

## 2023-03-09 LAB — POCT GLYCOSYLATED HEMOGLOBIN (HGB A1C): Hemoglobin A1C: 9 % — AB (ref 4.0–5.6)

## 2023-03-09 NOTE — Patient Instructions (Signed)
Diabetes regimen  Adjust Basaglar 25 units 2 times a day, in the morning and at bedtime. Adjust NovoLog/mealtime insulin take 10 units for regular meals 3 times a day.  Take 2 to 5 units of NovoLog with snacks.  You can take up to 12 units of NovoLog for large containing extra carbohydrate meal.  Use following sliding scale in addition to above regimen of NovoLog for blood sugar before eating.  Mild Sliding Scale Blood Glucose        Insulin 60-150                     None 151-200                   None 201-250                   2 units 251-300                   4 units 301-350                   6 units 351-400                   8 units      >400                        9 units and call provider

## 2023-03-09 NOTE — Progress Notes (Unsigned)
Outpatient Endocrinology Note [place] Neelam Tiggs, MD   Patient's Name: Diana Dixon    DOB: [birth year]    MRN: [id number]                                                    REASON OF VISIT: New consult / Follow up for type *** diabetes mellitus  REFERRING PROVIDER:   PCP: [facility], [facility], MD  HISTORY OF PRESENT ILLNESS:   Diana Dixon is a 63 y.o. old female with past medical history listed below, is here for new consult / follow up for type *** diabetes mellitus.   Pertinent Diabetes History: _Diagnosed as Diabetes Mellitus type ***  in ***, at the age of *** years.  Prior therapy: Initially managed with *** and insulin started in ***  History of DKA or diabetes related hospitalizations: ***none  Previous diabetes education: Yes ***  Family h/o diabetes mellitus: ***   No personal history of pancreatitis and / or family history of medullary thyroid carcinoma or MEN 2B syndrome. ***  Chronic Diabetes Complications : Retinopathy: ***. Last ophthalmology exam was done on ***, following with ophthalmology regularly.  Nephropathy: ***, on ACE/ARB *** Peripheral neuropathy: ***, on *** Coronary artery disease: *** Stroke: ***  Relevant comorbidities and cardiovascular risk factors: Obesity: *** Body mass index is 28.86 kg/m.  Hypertension: Yes *** Hyperlipidemia : Yes, on statin ***  Current / Home Diabetic regimen includes:  nsulin regimen: Basaglar 24 units a.m.---28 units p.m., Novolog 6-8  units at breakfast and 9-10 units at lunch and supper   Prior diabetic medications:  Glycemic data:    CONTINUOUS GLUCOSE MONITORING SYSTEM (CGMS) INTERPRETATION: At today's visit, we reviewed CGM downloads. The full report is scanned in the media. Reviewing the CGM trends, blood glucose are as follows:  Dexcom G7 CGM-  Sensor Download (Sensor download was reviewed and summarized below.) Dates: January 14 to March 09, 2023  Glucose Management Indicator:  9%    Interpretation: Mostly hyperglycemia postprandially with blood sugar up to 250 - 350 range, most likely related with not enough meal bolus or no meal boluses and frequent snacking.  Occasional hypoglycemia in the early morning with blood sugar reading 60s.  Some of the time blood sugar trending down and acceptable blood sugar in between the meals.  Some of the overnight blood sugar with persistent hyperglycemia.  FreeStyle Libre CGM-  Production assistant, radio was reviewed and summarized below.) Dates: *** Sensor Average: ***  Glucose Management Indicator: ***% Glucose Variability: ***%  % data captured: ***  Glycemic Trends:  <54: ***% 54-70: ***% 71-180: ***% 181-250: ***% 251-400: ***%   Interpretation: - ***  Diana Dixon's glucose meter is computer downloaded. Raw data and trends analyzed.   -  She has been testing her blood glucoses *** times daily.  -  Average glucose for the last *** days is *** mg/dl, range *** - ***. -  Trends noted: ***   Hypoglycemia: Patient has *** hypoglycemic episodes. Patient has hypoglycemia awareness, has a glucagon ER kit and diabetic alert device ***.   Factors modifying glucose control: 1.  Diabetic diet assessment: ***  2.  Staying active or exercising: ***  3.  Medication compliance: compliant *** of the time.  Interval history  ***  REVIEW OF SYSTEMS As per history of present illness.  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     Hypertension    Major depressive disorder, single episode, moderate (HCC)    PONV (postoperative nausea and vomiting)     PAST SURGICAL HISTORY: Past Surgical History:  Procedure Laterality Date   ABDOMINAL HYSTERECTOMY     CATARACT EXTRACTION W/PHACO Right 05/07/2021   Procedure: CATARACT EXTRACTION PHACO AND INTRAOCULAR LENS PLACEMENT (IOC) RIGHT DIABETIC 5.83 00:47.9;  Surgeon: Galen Manila, MD;  Location: [facility];  Service: Ophthalmology;  Laterality: Right;  Diabetic   COLONOSCOPY WITH PROPOFOL N/A 09/27/2019   Procedure: COLONOSCOPY WITH PROPOFOL;  Surgeon: Wyline Mood, MD;  Location[facility];  Service: Gastroenterology;  Laterality: N/A;   COLONOSCOPY WITH PROPOFOL N/A 03/30/2020   Procedure: COLONOSCOPY WITH PROPOFOL;  Surgeon: Earline Mayotte, MD;  Location: [facility] ENDOSCOPY;  Service: Endoscopy;  Laterality: N/A;   EYE SURGERY     PARS PLANA VITRECTOMY Left 10/11/2014   Procedure: PARS PLANA VITRECTOMY WITH 25 GAUGE,CATARACT EXTRACTION WITH IOL INSERTION , ENDOLASER;  Surgeon: Marcelene Butte, MD;  Location: [facility];  Service: Ophthalmology;  Laterality: Left;  [place] AP5  CDE CASETTE LOT # [id number]   SHOULDER SURGERY     TONSILLECTOMY     TONSILLECTOMY      ALLERGIES: Allergies  Allergen Reactions   Penicillins Nausea And Vomiting and Rash   Sulfa Antibiotics Rash    FAMILY HISTORY:  Family History  Problem Relation Age of Onset   Thyroid disease Mother    Breast cancer Mother 1   Hypertension Mother    Depression Mother    Anxiety disorder Mother    Colon polyps Mother    Breast cancer Maternal Aunt 48   Cancer Maternal Aunt    Breast cancer Cousin 39       maternal side    SOCIAL HISTORY: Social History   Socioeconomic History   Marital status: Single    Spouse name: Not on file   Number of children: 1   Years of education: Not on file   Highest education level: Some college, no degree  Occupational History    Comment: part  time  Tobacco Use   Smoking status: Never   Smokeless tobacco: Never  Vaping Use   Vaping status: Never Used  Substance and Sexual Activity   Alcohol use: No    Alcohol/week: 0.0 standard drinks of alcohol   Drug use: No   Sexual activity: Not Currently    Partners: Male  Other Topics Concern   Not on file  Social History Narrative   Regular exercise: walk   Caffeine use: sodas   Social Drivers of Health   Financial Resource Strain: Low Risk  (12/01/2022)   [facility]   Overall Financial Resource Strain (CARDIA)    Difficulty of Paying Living Expenses: Not hard at all  Food Insecurity: No Food Insecurity (12/01/2022)  Received from [facility]   Hunger Vital Sign    Worried About Running Out of Food in the Last Year: Never true    Ran Out of Food in the Last Year: Never true  Transportation Needs: No Transportation Needs (12/01/2022)   Received from [facility] - Transportation    In the past 12 months, has lack of transportation kept you from medical appointments or from getting medications?: No    Lack of Transportation (Non-Medical): No  Physical Activity: Insufficiently Active (02/02/2017)   Exercise Vital Sign    Days of Exercise per Week: 4 days    Minutes of Exercise per Session: 20 min  Stress: No Stress Concern Present (02/02/2017)   [employer] of Occupational Health - Occupational Stress Questionnaire    Feeling of Stress : Not at all  Social Connections: Somewhat Isolated (02/02/2017)   Social Connection and Isolation Panel [NHANES]    Frequency of Communication with Friends and Family: More than three times a week    Frequency of Social Gatherings with Friends and Family: More than three times a week    Attends Religious Services: More than 4 times per year    Active Member of [employer] or Organizations: No    Attends Banker Meetings: Never    Marital Status:  Widowed    MEDICATIONS:  Current Outpatient Medications  Medication Sig Dispense Refill   Accu-Chek Softclix Lancets lancets Use as instructed to check blood sugar 4X daily 400 each 3   amLODipine (NORVASC) 5 MG tablet Take 1 tablet (5 mg total) by mouth daily. 90 tablet 1   aspirin EC 81 MG tablet Take 81 mg by mouth daily. Swallow whole.     Blood Glucose Monitoring Suppl (ACCU-CHEK GUIDE) w/Device KIT Use as instructed to check blood sugar 4X daily 1 kit 0   cetirizine (ZYRTEC) 10 MG tablet TAKE 1 TABLET BY MOUTH ONCE A DAY 90 tablet 3   Continuous Glucose Sensor (DEXCOM G7 SENSOR) MISC 1 Device by Does not apply route as directed. Change sensor every 10 days 3 each 3   glucose blood (ACCU-CHEK GUIDE) test strip Use as instructed to check blood sugar 4X daily 400 each 3   hydrocortisone 2.5 % cream      insulin aspart (NOVOLOG FLEXPEN) 100 UNIT/ML FlexPen INJECT 11 TO 14 UNITS UNDER THE SKIN 3 TIMES DAILY 15 mL 3   Insulin Glargine (BASAGLAR KWIKPEN) 100 UNIT/ML INJECT 26 UNITS IN TO THE SKIN EVERY MORNING AND 30 UNITS EVERY EVENING 15 mL 1   Insulin Pen Needle 32G X 4 MM MISC USE TO INJECT INSULIN AS DIRECTED 4 TIMES DAILY 400 each 1   Lancets Misc. (ACCU-CHEK SOFTCLIX LANCET DEV) KIT Use as instructed to check blood sugar 4X daily 1 kit 0   metoCLOPramide (REGLAN) 5 MG tablet Take 5 mg by mouth 4 (four) times daily.     olmesartan (BENICAR) 20 MG tablet Take 1 tablet (20 mg total) by mouth daily. 90 tablet 1   pantoprazole (PROTONIX) 40 MG tablet Take 1 tablet (40 mg total) by mouth daily. 90 tablet 3   PARoxetine (PAXIL) 20 MG tablet TAKE 1 AND 1/2 TABLETS BY MOUTH DAILY 90 tablet 1   pravastatin (PRAVACHOL) 80 MG tablet Take 1 tablet (80 mg total) by mouth daily. 90 tablet 1   triamcinolone cream (KENALOG) 0.1 % Apply 1 application. topically 2 (two) times daily. Apply lightly bid x 7-10 days  30 g 0   valACYclovir (VALTREX) 500 MG tablet Take 500 mg by mouth 2 (two) times daily.      No current facility-administered medications for this visit.    PHYSICAL EXAM: Vitals:   03/09/23 1307  BP: 128/60  Pulse: 77  Resp: 20  SpO2: 97%  Weight: 157 lb 12.8 oz (71.6 kg)  Height: 5\' 2"  (1.575 m)   Body mass index is 28.86 kg/m.  Wt Readings from Last 3 Encounters:  03/09/23 157 lb 12.8 oz (71.6 kg)  10/06/22 154 lb 9.6 oz (70.1 kg)  05/12/22 150 lb (68 kg)    General: Well developed, well nourished female in no apparent distress.  HEENT: AT/[place], no external lesions.  Eyes: Conjunctiva clear and no icterus. Neck: Neck supple  Lungs: Respirations not labored Neurologic: Alert, oriented, normal speech Extremities / Skin: Dry. No sores or rashes noted. No acanthosis nigricans Psychiatric: Does not appear depressed or anxious  Diabetic Foot Exam: Monofilament sensory exam intact / decreased b/l, no callus, no ulceration, Dorsalis Pedis 2+ b/l  Diabetic Foot Exam - Simple   No data filed     Diabetic Foot Examination {AMB DIABETIC FOOT UEAV:40981}  LABS Reviewed Lab Results  Component Value Date   HGBA1C 9.0 (A) 03/09/2023   HGBA1C 9.3 (A) 10/06/2022   HGBA1C 8.7 (A) 05/12/2022   No results found for: "FRUCTOSAMINE" Lab Results  Component Value Date   CHOL 164 05/12/2022   HDL 48.60 05/12/2022   LDLCALC 96 05/12/2022   LDLDIRECT 88.8 12/22/2013   TRIG 95.0 05/12/2022   CHOLHDL 3 05/12/2022   Lab Results  Component Value Date   MICRALBCREAT 1.9 05/12/2022   MICRALBCREAT 3.0 04/15/2021   Lab Results  Component Value Date   CREATININE 1.10 05/12/2022   Lab Results  Component Value Date   GFR 54.28 (L) 05/12/2022    ASSESSMENT / PLAN  1. Uncontrolled type 1 diabetes mellitus with hyperglycemia (HCC)     Diabetes Mellitus type ***, complicated by *** - Diabetic status / severity: ***  Lab Results  Component Value Date   HGBA1C 9.0 (A) 03/09/2023    - Hemoglobin A1c goal : <***%  - Medications: ***  I) II) III) IV)  - Home  glucose testing: *** - Discussed/ Gave Hypoglycemia treatment plan.  # Consult : not required at this time. ***  # Annual urine for microalbuminuria/ creatinine ratio, no microalbuminuria currently, continue ACE/ARB *** Last  Lab Results  Component Value Date   MICRALBCREAT 1.9 05/12/2022    # Foot check nightly / neuropathy, continue ***  # Annual dilated diabetic eye exams.   - Diet: {dmclinicdiabeticdiettype:42746::"Make healthy diabetic food choices","Eat reasonable portion sizes to promote a healthy weight"} - Life style / activity / exercise: {dmclinicactivity:42747}  2. Blood pressure  -  BP Readings from Last 1 Encounters:  03/09/23 128/60    - Control {IS/IS NOT:9024::"is"} in target.  - {endo rb hypertension recommendations:35631::"No change in current plans."}  3. Lipid status / Hyperlipidemia - Last  Lab Results  Component Value Date   LDLCALC 96 05/12/2022   - Continue ***   Diagnoses and all orders for this visit:  Uncontrolled type 1 diabetes mellitus with hyperglycemia (HCC) -     POCT glycosylated hemoglobin (Hb A1C)    DISPOSITION Follow up in clinic in   months suggested.   All questions answered and patient verbalized understanding of the plan.  [place] Willowdean Luhmann, MD [place] [facility] [address], [address] [place], [place] [address] Phone # [phone]  At least part of this note was generated using voice recognition software. Inadvertent word errors may have occurred, which were not recognized during the proofreading process.

## 2023-03-10 ENCOUNTER — Other Ambulatory Visit: Payer: Self-pay

## 2023-03-10 ENCOUNTER — Encounter: Payer: Self-pay | Admitting: Endocrinology

## 2023-03-10 DIAGNOSIS — E1065 Type 1 diabetes mellitus with hyperglycemia: Secondary | ICD-10-CM

## 2023-03-10 MED ORDER — DEXCOM G7 SENSOR MISC: 1.0000 | 3 refills | Status: DC

## 2023-03-10 MED ORDER — PRAVASTATIN SODIUM 80 MG PO TABS: 80.0000 mg | ORAL_TABLET | Freq: Every day | ORAL | 3 refills | Status: AC

## 2023-03-24 ENCOUNTER — Telehealth: Payer: Self-pay | Admitting: *Deleted

## 2023-03-24 NOTE — Telephone Encounter (Signed)
Pt stated--Dexcom G7 sensor not cover by insurance and need PA per [facility].

## 2023-03-24 NOTE — Telephone Encounter (Signed)
Pt stated --Dexcom G7 sensor need PA. Routed the info to PA team.

## 2023-03-25 ENCOUNTER — Other Ambulatory Visit (HOSPITAL_COMMUNITY): Payer: Self-pay

## 2023-03-25 ENCOUNTER — Telehealth: Payer: Self-pay

## 2023-03-25 NOTE — Telephone Encounter (Signed)
Pharmacy Patient Advocate Encounter   Received notification from Pt Calls Messages that prior authorization for Dexcom G7 sensor is required/requested.   Insurance verification completed.   The patient is insured through [employer] .   Per test claim: PA required; PA submitted to above mentioned insurance via CoverMyMeds Key/confirmation #/EOC [id number] Status is pending

## 2023-04-03 ENCOUNTER — Other Ambulatory Visit: Payer: Self-pay | Admitting: Endocrinology

## 2023-04-03 DIAGNOSIS — E1065 Type 1 diabetes mellitus with hyperglycemia: Secondary | ICD-10-CM

## 2023-04-06 ENCOUNTER — Other Ambulatory Visit: Payer: Self-pay | Admitting: Endocrinology

## 2023-04-06 DIAGNOSIS — E1065 Type 1 diabetes mellitus with hyperglycemia: Secondary | ICD-10-CM

## 2023-04-06 MED ORDER — INSULIN LISPRO (1 UNIT DIAL) 100 UNIT/ML (KWIKPEN)
11.0000 [IU] | PEN_INJECTOR | Freq: Three times a day (TID) | SUBCUTANEOUS | 4 refills | Status: DC
Start: 2023-04-06 — End: 2023-10-05

## 2023-04-08 NOTE — Telephone Encounter (Signed)
 Pharmacy Patient Advocate Encounter  Received notification from [facility] that Prior Authorization for Dexcom G7 sensor has been APPROVED through 03/23/24   PA #/Case ID/Reference #: [id number]

## 2023-04-29 ENCOUNTER — Other Ambulatory Visit: Payer: Self-pay

## 2023-04-29 ENCOUNTER — Other Ambulatory Visit: Payer: Self-pay | Admitting: Endocrinology

## 2023-04-29 ENCOUNTER — Encounter: Payer: Self-pay | Admitting: Endocrinology

## 2023-04-29 DIAGNOSIS — E10649 Type 1 diabetes mellitus with hypoglycemia without coma: Secondary | ICD-10-CM

## 2023-06-29 ENCOUNTER — Other Ambulatory Visit: Payer: Self-pay | Admitting: Endocrinology

## 2023-06-29 DIAGNOSIS — E10649 Type 1 diabetes mellitus with hypoglycemia without coma: Secondary | ICD-10-CM

## 2023-07-08 ENCOUNTER — Encounter: Payer: Self-pay | Admitting: Obstetrics and Gynecology

## 2023-07-11 ENCOUNTER — Other Ambulatory Visit: Payer: Self-pay | Admitting: Endocrinology

## 2023-07-11 DIAGNOSIS — E1065 Type 1 diabetes mellitus with hyperglycemia: Secondary | ICD-10-CM

## 2023-07-13 ENCOUNTER — Ambulatory Visit: Payer: Self-pay | Admitting: Endocrinology

## 2023-07-13 ENCOUNTER — Other Ambulatory Visit: Payer: Self-pay | Admitting: Endocrinology

## 2023-07-13 ENCOUNTER — Encounter: Payer: Self-pay | Admitting: Endocrinology

## 2023-07-13 ENCOUNTER — Other Ambulatory Visit: Payer: Self-pay

## 2023-07-13 ENCOUNTER — Ambulatory Visit: Payer: Managed Care, Other (non HMO) | Admitting: Endocrinology

## 2023-07-13 VITALS — BP 130/60 | HR 78 | Resp 20 | Ht 62.0 in | Wt 157.6 lb

## 2023-07-13 DIAGNOSIS — E1065 Type 1 diabetes mellitus with hyperglycemia: Secondary | ICD-10-CM

## 2023-07-13 LAB — POCT GLYCOSYLATED HEMOGLOBIN (HGB A1C): Hemoglobin A1C: 9.4 % — AB (ref 4.0–5.6)

## 2023-07-13 NOTE — Progress Notes (Signed)
 Outpatient Endocrinology Note Diana Eyad Rochford, MD   Patient's Name: Diana Dixon    DOB: 05/14/1960    MRN: [id number]                                                    REASON OF VISIT: Follow up for type 1 diabetes mellitus  PCP: [facility], [facility], MD  HISTORY OF PRESENT ILLNESS:   Diana Dixon is a 63 y.o. old female with past medical history listed below, is here for follow up for type 1 diabetes mellitus.   Pertinent Diabetes History: Patient was previously seen by Dr. Hubert Madden and was last time seen in August 2024.  Patient was diagnosed with type 1 diabetes mellitus in 1967. She has had long-standing type 1 diabetes with persistently poor control because of variability in blood sugars and difficulty with various self-care issues.  Chronic Diabetes Complications : Retinopathy: yes. Last ophthalmology exam was done on ? annually, following with ophthalmology regularly.  Nephropathy: no, on olmesartan . Peripheral neuropathy: no Coronary artery disease: no Stroke: no  Relevant comorbidities and cardiovascular risk factors: Obesity: no Body mass index is 28.83 kg/m.  Hypertension: Yes  Hyperlipidemia : Yes, on statin   Current / Home Diabetic regimen includes:  Basaglar  24 units in the morning and 30 units at bedtime. Humalog  10 units with meals 3 times a day.  Prior diabetic medications: She does not want to use insulin  pump.  Glycemic data:    CONTINUOUS GLUCOSE MONITORING SYSTEM (CGMS) INTERPRETATION: At today's visit, we reviewed CGM downloads. The full report is scanned in the media. Reviewing the CGM trends, blood glucose are as follows:  Dexcom G7 CGM-  Sensor Download (Sensor download was reviewed and summarized below.) Dates: May 20 to June 2 , 2025  Glucose Management Indicator: n/a%  Average blood sugar 246     Interpretation: Variable blood sugar with mostly hyperglycemia postprandially with blood sugar up to 350 - 400 range related to high carb  meal.  Occasionally trending down blood sugar with the use of Humalog  for correction and mealtime insulin  with occasional mild hypoglycemia with blood sugar in 60s.  Some of the days blood sugar lower normal overnight.  No concerning hypoglycemia.  Hypoglycemia: Patient has no hypoglycemic episodes. Patient has hypoglycemia awareness.  Factors modifying glucose control: 1.  Diabetic diet assessment: 3 meals a day, frequent snacking.  Regular soft drinks and sweet tea.  Baked potato.  2.  Staying active or exercising:   3.  Medication compliance: compliant most of the time.  Interval history  Diabetes regimen as reviewed and noted above.  Data as reviewed and noted above.  Variable blood sugar with mostly significant hyperglycemia postprandially related to high carb meal.  Hemoglobin A1c worsening 9.4%.  No other complaints today.  REVIEW OF SYSTEMS As per history of present illness.   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    Hypertension    Major depressive disorder, single episode, moderate (HCC)    PONV (postoperative nausea and vomiting)     PAST SURGICAL HISTORY: Past Surgical History:  Procedure Laterality Date   ABDOMINAL HYSTERECTOMY     CATARACT EXTRACTION W/PHACO Right 05/07/2021   Procedure: CATARACT EXTRACTION PHACO AND INTRAOCULAR LENS PLACEMENT (IOC) RIGHT DIABETIC 5.83 00:47.9;  Surgeon: Clair Crews, MD;  Location: [facility];  Service: Ophthalmology;  Laterality: Right;   Diabetic   COLONOSCOPY WITH PROPOFOL  N/A 09/27/2019   Procedure: COLONOSCOPY WITH PROPOFOL ;  Surgeon: Luke Salaam, MD;  Location: [facility];  Service: Gastroenterology;  Laterality: N/A;   COLONOSCOPY WITH PROPOFOL  N/A 03/30/2020   Procedure: COLONOSCOPY WITH PROPOFOL ;  Surgeon: Marshall Skeeter, MD;  Location: [facility] ENDOSCOPY;  Service: Endoscopy;  Laterality: N/A;   EYE SURGERY     PARS PLANA VITRECTOMY Left 10/11/2014   Procedure: PARS PLANA VITRECTOMY WITH 25 GAUGE,CATARACT EXTRACTION WITH IOL INSERTION , ENDOLASER;  Surgeon: Stephenie Einstein, MD;  Location: [facility] ORS;  Service: Ophthalmology;  Laterality: Left;  US  AP5  CDE CASETTE LOT # [id number]   SHOULDER SURGERY     TONSILLECTOMY     TONSILLECTOMY      ALLERGIES: Allergies  Allergen Reactions   Penicillins Nausea And Vomiting and Rash   Sulfa Antibiotics Rash    FAMILY HISTORY:  Family History  Problem Relation Age of Onset   Thyroid  disease Mother    Breast cancer Mother 35   Hypertension Mother    Depression Mother    Anxiety disorder Mother    Colon polyps Mother    Breast cancer Maternal Aunt 51   Cancer Maternal Aunt    Breast cancer Cousin 30       maternal side    SOCIAL HISTORY: Social History   Socioeconomic History   Marital status: Single    Spouse name: Not on file   Number of children: 1   Years of education: Not on file   Highest education level: Some college, no degree  Occupational History    Comment: part time  Tobacco Use   Smoking status: Never   Smokeless tobacco: Never  Vaping Use   Vaping status: Never Used  Substance and Sexual Activity   Alcohol use: No    Alcohol/week: 0.0 standard drinks of alcohol   Drug use: No   Sexual activity: Not Currently    Partners: Male  Other Topics Concern   Not on file  Social History Narrative   Regular exercise: walk   Caffeine use: sodas   Social Drivers of Health   Financial Resource Strain: Low Risk  (12/01/2022)   Received from  [facility]   Overall Financial Resource Strain (CARDIA)    Difficulty of Paying Living Expenses: Not hard at all  Food Insecurity: No Food Insecurity (12/01/2022)   Received from [facility]   Hunger Vital Sign    Worried About Running Out of Food in the Last Year: Never true    Ran Out of Food in the Last Year: Never true  Transportation Needs: No Transportation Needs (12/01/2022)   Received from [facility] - Transportation    In the past 12 months, has lack of transportation kept you from medical appointments or from getting medications?: No  Lack of Transportation (Non-Medical): No  Physical Activity: Insufficiently Active (02/02/2017)   Exercise Vital Sign    Days of Exercise per Week: 4 days    Minutes of Exercise per Session: 20 min  Stress: No Stress Concern Present (02/02/2017)   [employer] of Occupational Health - Occupational Stress Questionnaire    Feeling of Stress : Not at all  Social Connections: Somewhat Isolated (02/02/2017)   Social Connection and Isolation Panel [NHANES]    Frequency of Communication with Friends and Family: More than three times a week    Frequency of Social Gatherings with Friends and Family: More than three times a week    Attends Religious Services: More than 4 times per year    Active Member of [employer] or Organizations: No    Attends Banker Meetings: Never    Marital Status: Widowed    MEDICATIONS:  Current Outpatient Medications  Medication Sig Dispense Refill   Accu-Chek Softclix Lancets lancets Use as instructed to check blood sugar 4X daily 400 each 3   amLODipine  (NORVASC ) 5 MG tablet Take 1 tablet (5 mg total) by mouth daily. 90 tablet 1   aspirin EC 81 MG tablet Take 81 mg by mouth daily. Swallow whole.     BD PEN NEEDLE NANO ULTRAFINE 32G X 4 MM MISC USE TO INJECT INSULIN  AS DIRECTED FOUR TIMES DAILY 400 each 1   Blood Glucose Monitoring Suppl  (ACCU-CHEK GUIDE) w/Device KIT Use as instructed to check blood sugar 4X daily 1 kit 0   cetirizine  (ZYRTEC ) 10 MG tablet TAKE 1 TABLET BY MOUTH ONCE A DAY 90 tablet 3   Continuous Glucose Sensor (DEXCOM G7 SENSOR) MISC 1 DEVICE BY DOES NOT APPLY ROUTE AS DIRECTED. CHANGE SENSOR EVERY 10 DAYS 9 each 3   glucose blood (ACCU-CHEK GUIDE) test strip Use as instructed to check blood sugar 4X daily 400 each 3   hydrocortisone 2.5 % cream      Insulin  Glargine (BASAGLAR  KWIKPEN) 100 UNIT/ML INJECT 26 UNITS IN TO THE SKIN EVERY MORNING AND 30 UNITS EVERY EVENING 15 mL 1   insulin  lispro (HUMALOG  KWIKPEN) 100 UNIT/ML KwikPen Inject 11-14 Units into the skin 3 (three) times daily. 30 mL 4   Lancets Misc. (ACCU-CHEK SOFTCLIX LANCET DEV) KIT Use as instructed to check blood sugar 4X daily 1 kit 0   metoCLOPramide (REGLAN) 5 MG tablet Take 5 mg by mouth 4 (four) times daily.     olmesartan  (BENICAR ) 20 MG tablet Take 1 tablet (20 mg total) by mouth daily. 90 tablet 1   pantoprazole  (PROTONIX ) 40 MG tablet Take 1 tablet (40 mg total) by mouth daily. 90 tablet 3   PARoxetine  (PAXIL ) 20 MG tablet TAKE 1 AND 1/2 TABLETS BY MOUTH DAILY 90 tablet 1   pravastatin  (PRAVACHOL ) 80 MG tablet Take 1 tablet (80 mg total) by mouth daily. 90 tablet 3   triamcinolone  cream (KENALOG ) 0.1 % Apply 1 application. topically 2 (two) times daily. Apply lightly bid x 7-10 days 30 g 0   valACYclovir (VALTREX) 500 MG tablet Take 500 mg by mouth 2 (two) times daily.     No current facility-administered medications for this visit.    PHYSICAL EXAM: Vitals:   07/13/23 1057  BP: 130/60  Pulse: 78  Resp: 20  SpO2: 98%  Weight: 157 lb 9.6 oz (71.5 kg)  Height: 5\' 2"  (1.575 m)    Body mass index is 28.83 kg/m.  Wt Readings from Last 3 Encounters:  07/13/23 157 lb 9.6 oz (71.5 kg)  03/09/23 157 lb 12.8 oz (71.6 kg)  10/06/22 154 lb 9.6 oz (70.1 kg)    General: Well developed, well nourished female in no apparent distress.   HEENT: AT/[place], no external lesions.  Eyes: Conjunctiva clear and no icterus. Neck: Neck supple  Lungs: Respirations not labored Neurologic: Alert, oriented, normal speech Extremities / Skin: Dry.   Psychiatric: Does not appear depressed or anxious  Diabetic Foot Exam - Simple   No data filed    LABS Reviewed Lab Results  Component Value Date   HGBA1C 9.4 (A) 07/13/2023   HGBA1C 9.0 (A) 03/09/2023   HGBA1C 9.3 (A) 10/06/2022   No results found for: "FRUCTOSAMINE" Lab Results  Component Value Date   CHOL 164 05/12/2022   HDL 48.60 05/12/2022   LDLCALC 96 05/12/2022   LDLDIRECT 88.8 12/22/2013   TRIG 95.0 05/12/2022   CHOLHDL 3 05/12/2022   Lab Results  Component Value Date   MICRALBCREAT 1.9 05/12/2022   MICRALBCREAT 3.0 04/15/2021   Lab Results  Component Value Date   CREATININE 1.10 05/12/2022   Lab Results  Component Value Date   GFR 54.28 (L) 05/12/2022    ASSESSMENT / PLAN  1. Uncontrolled type 1 diabetes mellitus with hyperglycemia (HCC)     Diabetes Mellitus type 1, complicated by diabetic retinopathy. - Diabetic status / severity: Uncontrolled.  Lab Results  Component Value Date   HGBA1C 9.4 (A) 07/13/2023    - Hemoglobin A1c goal : <6.5%  Patient has variable blood sugar.  She is relatively using less of the NovoLog  and higher dose of basal insulin .  She has tendency of fasting hypoglycemia and hyperglycemia postprandially.  Discussed about importance of controlled diabetes mellitus to prevent chronic complications.  Discussed that insulin  pump therapy would be appropriate for her to have better diabetes control.  Patient is not ready at this time however she will think about it and let us  know.  Discussed about timing and compliance with mealtime insulin  Humalog .  Adjusted diabetes regimen as follows.  - Medications: See below.  Increase Basaglar  from 24 to 26 unit in the morning and stay on 30 units at bedtime.   Adjust Humalog /mealtime  insulin  take 10 - 15 units for regular meals 3 times a day.  Take 2 to 5 units of NovoLog  with snacks.  You can take up to 15 units of NovoLog  for large containing extra carbohydrate meal.  Use following sliding scale in addition to above regimen of NovoLog  for blood sugar before eating.  Mild Sliding Scale Blood Glucose        Insulin  60-150                     None 151-200                   None 201-250                   2 units 251-300                   4 units 301-350                   6 units 351-400                   8 units      >400  9 units and call provider    - Home glucose testing: Dexcom G7 and check as needed.  - Discussed/ Gave Hypoglycemia treatment plan.  # Consult : not required at this time.   # Annual urine for microalbuminuria/ creatinine ratio, no microalbuminuria currently, continue ACE/ARB /olmesartan . Last  Lab Results  Component Value Date   MICRALBCREAT 1.9 05/12/2022    # Foot check nightly.  # She has diabetic retinopathy, advised to follow-up with ophthalmology regularly.  - Diet: Make healthy diabetic food choices - Life style / activity / exercise: Discussed.  2. Blood pressure  -  BP Readings from Last 1 Encounters:  07/13/23 130/60    - Control is in target.  - No change in current plans.  3. Lipid status / Hyperlipidemia - Last  Lab Results  Component Value Date   LDLCALC 96 05/12/2022   - Continue pravastatin  80 mg daily.  Diagnoses and all orders for this visit:  Uncontrolled type 1 diabetes mellitus with hyperglycemia (HCC) -     POCT glycosylated hemoglobin (Hb A1C) -     Lipid panel -     Microalbumin / creatinine urine ratio -     Basic metabolic panel with GFR   Labs today as ordered.  DISPOSITION Follow up in clinic in 4  months suggested.  Patient wants to give 76-month follow-up instead of 3 months.   All questions answered and patient verbalized understanding of the plan.  Diana  Tamkia Temples, MD [facility] [address] [place], [address] [place], [place] [address] Phone # [phone]  At least part of this note was generated using voice recognition software. Inadvertent word errors may have occurred, which were not recognized during the proofreading process.

## 2023-07-13 NOTE — Patient Instructions (Signed)
 Diabetes regimen  Adjust Basaglar  26 units in the morning and 30 units at bedtime. Adjust NovoLog /mealtime insulin  take 10 -15 units for regular meals 3 times a day.  Take 2 to 5 units of NovoLog  with snacks.  You can take up to 15 units of NovoLog  for large containing extra carbohydrate meal.  Use following sliding scale in addition to above regimen of NovoLog  for blood sugar before eating.  Mild Sliding Scale Blood Glucose        Insulin  60-150                     None 151-200                   None 201-250                   2 units 251-300                   4 units 301-350                   6 units 351-400                   8 units      >400                        9 units and call provider

## 2023-07-18 ENCOUNTER — Encounter: Payer: Self-pay | Admitting: Endocrinology

## 2023-07-20 ENCOUNTER — Other Ambulatory Visit: Payer: Self-pay | Admitting: Obstetrics and Gynecology

## 2023-07-20 ENCOUNTER — Other Ambulatory Visit: Payer: Self-pay

## 2023-07-20 DIAGNOSIS — Z1231 Encounter for screening mammogram for malignant neoplasm of breast: Secondary | ICD-10-CM

## 2023-07-20 DIAGNOSIS — E1065 Type 1 diabetes mellitus with hyperglycemia: Secondary | ICD-10-CM

## 2023-07-20 MED ORDER — ACCU-CHEK GUIDE TEST VI STRP: ORAL_STRIP | 12 refills | Status: DC

## 2023-07-21 ENCOUNTER — Other Ambulatory Visit: Payer: Self-pay

## 2023-07-21 DIAGNOSIS — E1065 Type 1 diabetes mellitus with hyperglycemia: Secondary | ICD-10-CM

## 2023-07-21 MED ORDER — ONETOUCH ULTRA TEST VI STRP: ORAL_STRIP | 12 refills | Status: DC

## 2023-07-21 MED ORDER — ONETOUCH ULTRA MINI W/DEVICE KIT: PACK | 0 refills | Status: AC

## 2023-07-21 MED ORDER — ONETOUCH DELICA LANCETS 33G MISC: 5 refills | Status: AC

## 2023-07-22 ENCOUNTER — Ambulatory Visit
Admission: RE | Admit: 2023-07-22 | Discharge: 2023-07-22 | Disposition: A | Discharge status: Home / Self Care | Source: Ambulatory Visit | Attending: Obstetrics and Gynecology | Admitting: Obstetrics and Gynecology

## 2023-07-22 DIAGNOSIS — Z1231 Encounter for screening mammogram for malignant neoplasm of breast: Secondary | ICD-10-CM | POA: Insufficient documentation

## 2023-08-24 ENCOUNTER — Other Ambulatory Visit: Payer: Self-pay | Admitting: Endocrinology

## 2023-08-24 DIAGNOSIS — E10649 Type 1 diabetes mellitus with hypoglycemia without coma: Secondary | ICD-10-CM

## 2023-09-28 ENCOUNTER — Other Ambulatory Visit: Payer: Self-pay

## 2023-09-28 ENCOUNTER — Ambulatory Visit
Admission: EM | Admit: 2023-09-28 | Discharge: 2023-09-28 | Disposition: A | Discharge status: Home / Self Care | Attending: Emergency Medicine | Admitting: Emergency Medicine

## 2023-09-28 DIAGNOSIS — J069 Acute upper respiratory infection, unspecified: Secondary | ICD-10-CM

## 2023-09-28 DIAGNOSIS — J029 Acute pharyngitis, unspecified: Secondary | ICD-10-CM | POA: Diagnosis not present

## 2023-09-28 LAB — POC SOFIA SARS ANTIGEN FIA: SARS Coronavirus 2 Ag: NEGATIVE

## 2023-09-28 LAB — POCT RAPID STREP A (OFFICE): Rapid Strep A Screen: NEGATIVE

## 2023-09-28 NOTE — ED Triage Notes (Signed)
 Pt is here with a sore throat after using cleaning chemicals yesterday evening including bleach. Pt states she has taken OTC meds to relieve discomfort.

## 2023-09-28 NOTE — ED Provider Notes (Signed)
 CAY RALPH PELT    CSN: [id number] Arrival date & time: 09/28/23  1039      History   Chief Complaint Chief Complaint  Patient presents with   Sore Throat    HPI Diana Dixon is a 63 y.o. female.  Patient presents with 1 day history of sore throat and nonproductive cough.  No fever, difficulty swallowing, shortness of breath.  Patient took a baby aspirin this morning for her symptoms.  Patient notes that she was cleaning yesterday and using bleach; She did not consume the bleach.  The history is provided by the patient and medical records.    Past Medical History:  Diagnosis Date   Allergic rhinitis 05/20/2019   Anxiety    Depression    Diabetes mellitus type I (HCC)    Diabetes mellitus without complication (HCC)    Type I   Endometriosis    Family history of adverse reaction to anesthesia    Mother - PONV   Fibroid    Generalized anxiety disorder    GERD (gastroesophageal reflux disease)    Heart murmur    History of hepatitis C virus infection 20 years ago   She can't recall but did get treated for HCV and it was resolved.    Hypertension    Major depressive disorder, single episode, moderate (HCC)    PONV (postoperative nausea and vomiting)     Patient Active Problem List   Diagnosis Date Noted   Allergic rhinitis 05/20/2019   Hypercholesterolemia 01/28/2017   Anxiety 06/06/2014   Depression, major, single episode, moderate (HCC) 06/06/2014   Depressive disorder 06/06/2014   Insulin  dependent type 1 diabetes mellitus (HCC) 06/06/2014   H/O gastrointestinal disease 06/06/2014   Anxiety, generalized 06/06/2014   H/O: HTN (hypertension) 06/06/2014   Vaginal vault smear abnormal 06/06/2014   Essential hypertension 09/21/2012   Depression 09/21/2012   Uncontrolled type 1 diabetes mellitus 09/15/2012    Past Surgical History:  Procedure Laterality Date   ABDOMINAL HYSTERECTOMY     CATARACT EXTRACTION W/PHACO Right 05/07/2021   Procedure: CATARACT  EXTRACTION PHACO AND INTRAOCULAR LENS PLACEMENT (IOC) RIGHT DIABETIC 5.83 00:47.9;  Surgeon: Jaye Fallow, MD;  [facility];  Service: Ophthalmology;  Laterality: Right;  Diabetic   COLONOSCOPY WITH PROPOFOL  N/A 09/27/2019   Procedure: COLONOSCOPY WITH PROPOFOL ;  Surgeon: Therisa Bi, MD;  Location: [facility];  Service: Gastroenterology;  Laterality: N/A;   COLONOSCOPY WITH PROPOFOL  N/A 03/30/2020   Procedure: COLONOSCOPY WITH PROPOFOL ;  Surgeon: Dessa Reyes ORN, MD;  Location: [facility] ENDOSCOPY;  Service: Endoscopy;  Laterality: N/A;   EYE SURGERY     PARS PLANA VITRECTOMY Left 10/11/2014   Procedure: PARS PLANA VITRECTOMY WITH 25 GAUGE,CATARACT EXTRACTION WITH IOL INSERTION , ENDOLASER;  Surgeon: Harlene Scarce, MD;  Location: [facility] OR[facility];  Service: Ophthalmology;  Laterality: Left;  US  AP5  CDE CASETTE LOT # [id number]   SHOULDER SURGERY     TONSILLECTOMY     TONSILLECTOMY      OB History   No obstetric history on file.      Home Medications    Prior to Admission medications   Medication Sig Start Date End Date Taking? Authorizing Provider  Accu-Chek Softclix Lancets lancets Use as instructed to check blood sugar 4X daily 05/16/22   Von [place], MD  amLODipine  (NORVASC ) 5 MG tablet Take 1 tablet (5 mg total) by mouth daily. 04/11/21   Von [place], MD  aspirin EC 81 MG tablet Take 81 mg by mouth  daily. Swallow whole.    [provider]  BD PEN NEEDLE NANO ULTRAFINE 32G X 4 MM MISC USE TO INJECT INSULIN  AS DIRECTED FOUR TIMES DAILY 07/13/23   Thapa, [place], MD  Blood Glucose Monitoring Suppl (ACCU-CHEK GUIDE) w/Device KIT Use as instructed to check blood sugar 4X daily 05/16/22   Von [place], MD  Blood Glucose Monitoring Suppl (ONE TOUCH ULTRA MINI) w/Device KIT Use to check blood glucose level four times a day 07/21/23   Thapa, [place], MD  cetirizine  (ZYRTEC ) 10 MG tablet TAKE 1 TABLET BY MOUTH ONCE A DAY 01/23/21   Marylynn Verneita CROME, MD  Continuous Glucose Sensor  (DEXCOM G7 SENSOR) MISC 1 DEVICE BY DOES NOT APPLY ROUTE AS DIRECTED. CHANGE SENSOR EVERY 10 DAYS 07/13/23   Thapa, [place], MD  glucose blood (ACCU-CHEK GUIDE TEST) test strip Use to test blood glucose before meals and at bedtime 07/20/23   Thapa, [place], MD  glucose blood (ACCU-CHEK GUIDE) test strip Use as instructed to check blood sugar 4X daily 05/16/22   Von [place], MD  glucose blood (ONETOUCH ULTRA TEST) test strip Use as instructed 07/21/23   Thapa, [place], MD  hydrocortisone 2.5 % cream  05/16/19   [provider]  Insulin  Glargine (BASAGLAR  KWIKPEN) 100 UNIT/ML INJECT 26 UNITS IN TO THE SKIN EVERY MORNING AND 30 UNITS EVERY EVENING 08/24/23   Thapa, [place], MD  insulin  lispro (HUMALOG  KWIKPEN) 100 UNIT/ML KwikPen Inject 11-14 Units into the skin 3 (three) times daily. 04/06/23   Thapa, [place], MD  Lancets Misc. (ACCU-CHEK SOFTCLIX LANCET DEV) KIT Use as instructed to check blood sugar 4X daily 05/16/22   Von [place], MD  metoCLOPramide (REGLAN) 5 MG tablet Take 5 mg by mouth 4 (four) times daily.    [provider]  olmesartan  (BENICAR ) 20 MG tablet Take 1 tablet (20 mg total) by mouth daily. 04/11/21   Von [place], MD  OneTouch Delica Lancets 33G MISC Use to prick finger to check blood glucose four times a day 07/21/23   Thapa, [place], MD  pantoprazole  (PROTONIX ) 40 MG tablet Take 1 tablet (40 mg total) by mouth daily. 05/30/20   Von Pacific, MD  PARoxetine  (PAXIL ) 20 MG tablet TAKE 1 AND 1/2 TABLETS BY MOUTH DAILY 01/23/21   Von [place], MD  pravastatin  (PRAVACHOL ) 80 MG tablet Take 1 tablet (80 mg total) by mouth daily. 03/10/23   Thapa, [place], MD  triamcinolone  cream (KENALOG ) 0.1 % Apply 1 application. topically 2 (two) times daily. Apply lightly bid x 7-10 days 04/22/21   Rodriguez-Southworth, Sylvia, PA-C  valACYclovir (VALTREX) 500 MG tablet Take 500 mg by mouth 2 (two) times daily.    [provider]    Family History Family History  Problem Relation Age of Onset   Thyroid   disease Mother    Breast cancer Mother 27   Hypertension Mother    Depression Mother    Anxiety disorder Mother    Colon polyps Mother    Breast cancer Maternal Aunt 32   Cancer Maternal Aunt    Breast cancer Cousin 42       maternal side    Social History Social History   Tobacco Use   Smoking status: Never   Smokeless tobacco: Never  Vaping Use   Vaping status: Never Used  Substance Use Topics   Alcohol use: No    Alcohol/week: 0.0 standard drinks of alcohol   Drug use: No     Allergies   Penicillins and Sulfa antibiotics  Review of Systems Review of Systems  Constitutional:  Negative for chills and fever.  HENT:  Positive for sore throat. Negative for ear pain.   Respiratory:  Positive for cough. Negative for shortness of breath.   Cardiovascular:  Negative for chest pain and palpitations.  Gastrointestinal:  Negative for diarrhea and vomiting.     Physical Exam Triage Vital Signs ED Triage Vitals  Encounter Vitals Group     BP      Girls Systolic BP Percentile      Girls Diastolic BP Percentile      Boys Systolic BP Percentile      Boys Diastolic BP Percentile      Pulse      Resp      Temp      Temp src      SpO2      Weight      Height      Head Circumference      Peak Flow      Pain Score      Pain Loc      Pain Education      Exclude from Growth Chart    No data found.  Updated Vital Signs BP (!) 146/73 (BP Location: Left Arm)   Pulse 76   Temp 98.3 F (36.8 C) (Oral)   Resp 17   SpO2 97%   Visual Acuity Right Eye Distance:   Left Eye Distance:   Bilateral Distance:    Right Eye Near:   Left Eye Near:    Bilateral Near:     Physical Exam Constitutional:      General: She is not in acute distress. HENT:     Right Ear: Tympanic membrane normal.     Left Ear: Tympanic membrane normal.     Nose: Nose normal.     Mouth/Throat:     Mouth: Mucous membranes are moist.     Pharynx: Oropharynx is clear.  Cardiovascular:      Rate and Rhythm: Normal rate and regular rhythm.     Heart sounds: Normal heart sounds.  Pulmonary:     Effort: Pulmonary effort is normal. No respiratory distress.     Breath sounds: Normal breath sounds.  Neurological:     Mental Status: She is alert.      UC Treatments / Results  Labs (all labs ordered are listed, but only abnormal results are displayed) Labs Reviewed  POCT RAPID STREP A (OFFICE) - Normal  POC SOFIA SARS ANTIGEN FIA - Normal    EKG   Radiology No results found.  Procedures Procedures (including critical care time)  Medications Ordered in UC Medications - No data to display  Initial Impression / Assessment and Plan / UC Course  I have reviewed the triage vital signs and the nursing notes.  Pertinent labs & imaging results that were available during my care of the patient were reviewed by me and considered in my medical decision making (see chart for details).   Sore throat, viral URI.  Afebrile and vital signs are stable.  Rapid strep negative.  COVID negative.  Discussed symptomatic treatment including Tylenol  or ibuprofen as needed for fever or discomfort, plain Mucinex as needed for congestion, rest, hydration.  Instructed patient to follow-up with her PCP if not improving.  ED precautions given.  Patient agrees to plan of care.    Final Clinical Impressions(s) / UC Diagnoses   Final diagnoses:  Sore throat  Viral URI  Discharge Instructions      The COVID and strep tests are negative.   Take Tylenol  or ibuprofen as needed for fever or discomfort.  Take plain Mucinex as needed for congestion.  Rest and keep yourself hydrated.    Follow-up with your primary care provider if your symptoms are not improving.         ED Prescriptions   None    PDMP not reviewed this encounter.   Corlis Burnard DEL, NP 09/28/23 1204

## 2023-09-28 NOTE — Discharge Instructions (Signed)
 The COVID and strep tests are negative.   Take Tylenol  or ibuprofen as needed for fever or discomfort.  Take plain Mucinex as needed for congestion.  Rest and keep yourself hydrated.    Follow-up with your primary care provider if your symptoms are not improving.

## 2023-10-05 ENCOUNTER — Other Ambulatory Visit: Payer: Self-pay

## 2023-10-05 DIAGNOSIS — E1065 Type 1 diabetes mellitus with hyperglycemia: Secondary | ICD-10-CM

## 2023-10-05 MED ORDER — INSULIN LISPRO (1 UNIT DIAL) 100 UNIT/ML (KWIKPEN): 11.0000 [IU] | PEN_INJECTOR | Freq: Three times a day (TID) | SUBCUTANEOUS | 4 refills | Status: DC

## 2023-10-05 MED ORDER — INSULIN LISPRO (1 UNIT DIAL) 100 UNIT/ML (KWIKPEN): 10.0000 [IU] | PEN_INJECTOR | Freq: Three times a day (TID) | SUBCUTANEOUS | 4 refills | Status: DC

## 2023-10-19 ENCOUNTER — Other Ambulatory Visit: Payer: Self-pay | Admitting: Endocrinology

## 2023-10-19 DIAGNOSIS — E10649 Type 1 diabetes mellitus with hypoglycemia without coma: Secondary | ICD-10-CM

## 2023-10-19 NOTE — Telephone Encounter (Signed)
 Refill request complete

## 2023-11-24 ENCOUNTER — Encounter: Payer: Self-pay | Admitting: Endocrinology

## 2023-11-26 ENCOUNTER — Other Ambulatory Visit: Payer: Self-pay

## 2023-11-26 DIAGNOSIS — E10649 Type 1 diabetes mellitus with hypoglycemia without coma: Secondary | ICD-10-CM

## 2023-11-26 MED ORDER — FREESTYLE LITE TEST VI STRP: ORAL_STRIP | 5 refills | Status: DC

## 2023-11-26 MED ORDER — FREESTYLE FREEDOM LITE W/DEVICE KIT: PACK | 0 refills | Status: AC

## 2023-12-14 ENCOUNTER — Ambulatory Visit: Admitting: Endocrinology

## 2023-12-14 ENCOUNTER — Encounter: Payer: Self-pay | Admitting: Endocrinology

## 2023-12-14 ENCOUNTER — Other Ambulatory Visit: Payer: Self-pay | Admitting: Endocrinology

## 2023-12-14 DIAGNOSIS — E1065 Type 1 diabetes mellitus with hyperglycemia: Secondary | ICD-10-CM

## 2023-12-14 DIAGNOSIS — E108 Type 1 diabetes mellitus with unspecified complications: Secondary | ICD-10-CM

## 2023-12-14 DIAGNOSIS — E10649 Type 1 diabetes mellitus with hypoglycemia without coma: Secondary | ICD-10-CM

## 2023-12-14 MED ORDER — FREESTYLE LITE TEST VI STRP: ORAL_STRIP | 5 refills | Status: AC

## 2023-12-14 MED ORDER — DEXCOM G7 SENSOR MISC: 1.0000 | 3 refills | Status: DC

## 2023-12-14 MED ORDER — INSULIN LISPRO (1 UNIT DIAL) 100 UNIT/ML (KWIKPEN): 10.0000 [IU] | PEN_INJECTOR | Freq: Three times a day (TID) | SUBCUTANEOUS | 4 refills | Status: AC

## 2023-12-14 MED ORDER — BASAGLAR KWIKPEN 100 UNIT/ML ~~LOC~~ SOPN: 32.0000 [IU] | PEN_INJECTOR | Freq: Two times a day (BID) | SUBCUTANEOUS | 5 refills | Status: AC

## 2023-12-14 MED ORDER — DEXCOM G7 SENSOR MISC: 1.0000 | 3 refills | Status: AC

## 2023-12-14 NOTE — Progress Notes (Signed)
 Outpatient Endocrinology Note Diana Campanella, MD   Patient's Name: Diana Dixon    DOB: 30-Jun-1960    MRN: [id number]                                                    REASON OF VISIT: Follow up for type 1 diabetes mellitus  PCP: [facility], [facility], [place]  HISTORY OF PRESENT ILLNESS:   Diana Dixon is a 63 y.o. old female with past medical history listed below, is here for follow up for type 1 diabetes mellitus.   Pertinent Diabetes History: Patient was previously seen by Dr. Von and was last time seen in August 2024.  Patient was diagnosed with type 1 diabetes mellitus in 1967. She has had long-standing type 1 diabetes with persistently poor control because of variability in blood sugars and difficulty with various self-care issues.  Chronic Diabetes Complications : Retinopathy: yes. Last ophthalmology exam was done on ? annually, following with ophthalmology regularly.  Nephropathy: no, on olmesartan . Peripheral neuropathy: no Coronary artery disease: no Stroke: no  Relevant comorbidities and cardiovascular risk factors: Obesity: no Body mass index is 28.9 kg/m.  Hypertension: Yes  Hyperlipidemia : Yes, on statin   Current / Home Diabetic regimen includes:  Basaglar  28 units in the morning and 30 units at bedtime. Humalog  10 - 15 units with meals 3 times a day.  Prior diabetic medications: She does not want to use insulin  pump.  Glycemic data:   Not able to download glucometer in the clinic today, glucose data reviewed directly from the meter as follows mostly hyperglycemia.  7 days average 252.  190,237, 311, 306, 399, 258, 210, 251, 418, 193, 266, 161, 313, 196, 181, 108, 77, 289, 171, 323, 229.  Had used Dexcom G7 currently not using patient reports backorder in her pharmacy.  CONTINUOUS GLUCOSE MONITORING SYSTEM (CGMS) INTERPRETATION: At today's visit, we reviewed CGM downloads. The full report is scanned in the media. Reviewing the CGM trends, blood glucose  are as follows:  Dexcom G7 CGM-from previous visit. Sensor Download (Sensor download was reviewed and summarized below.) Dates: May 20 to June 2 , 2025  Glucose Management Indicator: n/a%  Average blood sugar 246     Interpretation: Variable blood sugar with mostly hyperglycemia postprandially with blood sugar up to 350 - 400 range related to high carb meal.  Occasionally trending down blood sugar with the use of Humalog  for correction and mealtime insulin  with occasional mild hypoglycemia with blood sugar in 60s.  Some of the days blood sugar lower normal overnight.  No concerning hypoglycemia.  Hypoglycemia: Patient has no hypoglycemic episodes. Patient has hypoglycemia awareness.  Factors modifying glucose control: 1.  Diabetic diet assessment: 3 meals a day, frequent snacking.  Regular soft drinks and sweet tea.  Baked potato.  2.  Staying active or exercising:   3.  Medication compliance: compliant most of the time.  Interval history  Diabetes has been as reviewed above.  She reports compliance with insulin  and taking before eating.  Still with hyperglycemia glucometer as reviewed above.  Reviewed normal blood sugar otherwise mostly hyperglycemia.  Hemoglobin A1c 9.7% few days ago in outside facility.  She denies numbness and tingling to feet.  No vision problem.  No other complaints today.  REVIEW OF SYSTEMS As per history of present illness.  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     Hypertension    Major depressive disorder, single episode, moderate (HCC)    PONV (postoperative nausea and vomiting)     PAST SURGICAL HISTORY: Past Surgical History:  Procedure Laterality Date   ABDOMINAL HYSTERECTOMY     CATARACT EXTRACTION W/PHACO Right 05/07/2021   Procedure: CATARACT EXTRACTION PHACO AND INTRAOCULAR LENS PLACEMENT (IOC) RIGHT DIABETIC 5.83 00:47.9;  Surgeon: Jaye Fallow, MD;  Location: [facility];  Service: Ophthalmology;  Laterality: Right;  Diabetic   COLONOSCOPY WITH PROPOFOL  N/A 09/27/2019   Procedure: COLONOSCOPY WITH PROPOFOL ;  Surgeon: Therisa Bi, MD;  Location: [facility];  Service: Gastroenterology;  Laterality: N/A;   COLONOSCOPY WITH PROPOFOL  N/A 03/30/2020   Procedure: COLONOSCOPY WITH PROPOFOL ;  Surgeon: Dessa Reyes ORN, MD;  Location: [facility] ENDOSCOPY;  Service: Endoscopy;  Laterality: N/A;   EYE SURGERY     PARS PLANA VITRECTOMY Left 10/11/2014   Procedure: PARS PLANA VITRECTOMY WITH 25 GAUGE,CATARACT EXTRACTION WITH IOL INSERTION , ENDOLASER;  Surgeon: Harlene Scarce, MD;  Location: [facility] ORS;  Service: Ophthalmology;  Laterality: Left;  US  AP5  CDE CASETTE LOT # [id number]   SHOULDER SURGERY     TONSILLECTOMY     TONSILLECTOMY      ALLERGIES: Allergies  Allergen Reactions   Penicillins Nausea And Vomiting and Rash   Sulfa Antibiotics Rash    FAMILY HISTORY:  Family History  Problem Relation Age of Onset   Thyroid  disease Mother    Breast cancer Mother 56   Hypertension Mother    Depression Mother    Anxiety disorder Mother    Colon polyps Mother    Breast cancer Maternal Aunt 53   Cancer Maternal Aunt    Breast cancer Cousin 77       maternal side    SOCIAL HISTORY: Social History   Socioeconomic History   Marital status: Single    Spouse name: Not on file   Number of children: 1   Years of education: Not on file   Highest education level: Some college, no degree  Occupational History    Comment: part  time  Tobacco Use   Smoking status: Never   Smokeless tobacco: Never  Vaping Use   Vaping status: Never Used  Substance and Sexual Activity   Alcohol use: No    Alcohol/week: 0.0 standard drinks of alcohol   Drug use: No   Sexual activity: Not Currently    Partners: Male  Other Topics Concern   Not on file  Social History Narrative   Regular exercise: walk   Caffeine use: sodas   Social Drivers of Health   Financial Resource Strain: Patient Declined (12/07/2023)   Received [facility]   Overall Financial Resource Strain (CARDIA)    Difficulty of Paying Living Expenses: Patient declined  Food Insecurity: Patient Declined (12/07/2023)   [facility]   Hunger Vital Sign    Within the past 12 months, you worried that your food would run out before you got the money to buy more.: Patient declined    Within the past 12 months, the food you bought just didn't last and you didn't have money to get more.: Patient declined  Transportation Needs: No Transportation Needs (12/07/2023)   Received from [facility] - Transportation    In the past 12 months, has lack of transportation kept you from medical appointments or from getting medications?: No    Lack of Transportation (Non-Medical): No  Physical Activity: Insufficiently Active (02/02/2017)   Exercise Vital Sign    Days of Exercise per Week: 4 days    Minutes of Exercise per Session: 20 min  Stress: No Stress Concern Present (02/02/2017)   [employer] of Occupational [employer] - Occupational Stress Questionnaire    Feeling of Stress : Not at all  Social Connections: Somewhat Isolated (02/02/2017)   Social Connection and Isolation Panel    Frequency of Communication with Friends and Family: More than three times a week    Frequency of Social Gatherings with Friends and Family: More than three times a week    Attends Religious Services: More than 4  times per year    Active Member of [employer] or Organizations: No    Attends Banker Meetings: Never    Marital Status: Widowed    MEDICATIONS:  Current Outpatient Medications  Medication Sig Dispense Refill   Accu-Chek Softclix Lancets lancets Use as instructed to check blood sugar 4X daily 400 each 3   amLODipine  (NORVASC ) 5 MG tablet Take 1 tablet (5 mg total) by mouth daily. 90 tablet 1   aspirin EC 81 MG tablet Take 81 mg by mouth daily. Swallow whole.     BD PEN NEEDLE NANO ULTRAFINE 32G X 4 MM MISC USE TO INJECT INSULIN  AS DIRECTED FOUR TIMES DAILY 400 each 1   Blood Glucose Monitoring Suppl (FREESTYLE FREEDOM LITE) w/Device KIT Use to check blood sugar 4 times a day as needed as backup to CGM 1 kit 0   Blood Glucose Monitoring Suppl (ONE TOUCH ULTRA MINI) w/Device KIT Use to check blood glucose level four times a day 1 kit 0   cetirizine  (ZYRTEC ) 10 MG tablet TAKE 1 TABLET BY MOUTH ONCE A DAY 90 tablet 3   hydrocortisone 2.5 % cream      Lancets Misc. (ACCU-CHEK SOFTCLIX LANCET DEV) KIT Use as instructed to check blood sugar 4X daily 1 kit 0   metoCLOPramide (REGLAN) 5 MG tablet Take 5 mg by mouth 4 (four) times daily.     olmesartan  (BENICAR ) 20 MG tablet Take 1 tablet (20 mg total) by mouth daily. 90 tablet 1   OneTouch Delica Lancets 33G MISC Use to prick finger to check blood glucose four times a day 300 each 5   pantoprazole  (PROTONIX ) 40 MG tablet Take 1 tablet (40 mg total) by mouth daily. 90 tablet 3   PARoxetine  (PAXIL ) 20 MG tablet TAKE 1 AND 1/2 TABLETS BY MOUTH DAILY 90 tablet 1   pravastatin  (PRAVACHOL ) 80 MG tablet Take 1 tablet (80 mg total) by mouth daily. 90 tablet 3   triamcinolone  cream (KENALOG ) 0.1 % Apply 1 application. topically 2 (two) times daily. Apply lightly bid x 7-10 days 30 g 0   valACYclovir (VALTREX) 500 MG tablet Take 500 mg by mouth 2 (two) times daily.  Continuous Glucose Sensor (DEXCOM G7 SENSOR) MISC 1 Device by Does not apply route  as directed. Change sensor every 10 days 9 each 3   glucose blood (FREESTYLE LITE) test strip Use to check blood sugar 4 times a day as needed 400 each 5   Insulin  Glargine (BASAGLAR  KWIKPEN) 100 UNIT/ML Inject 32 Units into the skin 2 (two) times daily. 45 mL 5   insulin  lispro (HUMALOG  KWIKPEN) 100 UNIT/ML KwikPen Inject 10-18 Units into the skin 3 (three) times daily. Maximum 54 units/day. 30 mL 4   No current facility-administered medications for this visit.    PHYSICAL EXAM: Vitals:   12/14/23 1029  BP: 132/60  Weight: 158 lb (71.7 kg)  Height: 5' 2 (1.575 m)    Body mass index is 28.9 kg/m.  Wt Readings from Last 3 Encounters:  12/14/23 158 lb (71.7 kg)  07/13/23 157 lb 9.6 oz (71.5 kg)  03/09/23 157 lb 12.8 oz (71.6 kg)    General: Well developed, well nourished female in no apparent distress.  HEENT: AT/[place], no external lesions.  Eyes: Conjunctiva clear and no icterus. Neck: Neck supple  Lungs: Respirations not labored Neurologic: Alert, oriented, normal speech Extremities / Skin: Dry.   Psychiatric: Does not appear depressed or anxious  Diabetic Foot Exam - Simple   Simple Foot Form Diabetic Foot exam was performed with the following findings: Yes 12/14/2023 10:49 AM  Visual Inspection No deformities, no ulcerations, no other skin breakdown bilaterally: Yes Sensation Testing Intact to touch and monofilament testing bilaterally: Yes Pulse Check Posterior Tibialis and Dorsalis pulse intact bilaterally: Yes Comments    LABS Reviewed Lab Results  Component Value Date   HGBA1C 9.4 (A) 07/13/2023   HGBA1C 9.0 (A) 03/09/2023   HGBA1C 9.3 (A) 10/06/2022   No results found for: FRUCTOSAMINE Lab Results  Component Value Date   CHOL 134 07/13/2023   HDL 56 07/13/2023   LDLCALC 62 07/13/2023   LDLDIRECT 88.8 12/22/2013   TRIG 77 07/13/2023   CHOLHDL 2.4 07/13/2023   Lab Results  Component Value Date   MICRALBCREAT 34 (H) 07/13/2023   Lab Results   Component Value Date   CREATININE 1.02 07/13/2023   Lab Results  Component Value Date   GFR 54.28 (L) 05/12/2022    ASSESSMENT / PLAN  1. Uncontrolled type 1 diabetes mellitus with hyperglycemia (HCC)   2. Type 1 diabetes mellitus with complications (HCC)     Diabetes Mellitus type 1, complicated by diabetic retinopathy. - Diabetic status / severity: Uncontrolled.  Lab Results  Component Value Date   HGBA1C 9.4 (A) 07/13/2023    - Hemoglobin A1c goal : <6.5%  She is currently not using CGM.  Mostly hyperglycemia and glucometer data.  Discussed about importance of controlled diabetes mellitus to prevent chronic complications.  Discussed that insulin  pump therapy would be appropriate for her to have better diabetes control.  Patient is not ready at this time , she preferably does not want to go on insulin  pump.  Discussed about timing and compliance with mealtime insulin  Humalog .  Adjusted diabetes regimen as follows.  - Medications: See below.  Increase Basaglar  from 28 to 32 unit in the morning and from 30 to 32 units at bedtime.   Adjust Humalog /mealtime insulin  take 10 - 18 units for regular meals 3 times a day.  Take 2 to 5 units of NovoLog  with snacks.  You can take up to 15 units of NovoLog  for large containing extra carbohydrate meal.  Increase  up to 18 units with meals.  Advised to take before eating.  Use following sliding scale in addition to above regimen of NovoLog  for blood sugar before eating.  Mild Sliding Scale Blood Glucose        Insulin  60-150                     None 151-200                   None 201-250                   2 units 251-300                   4 units 301-350                   6 units 351-400                   8 units      >400                        9 units and call provider    - Home glucose testing: Dexcom G7 and check as needed.  Sent prescription.  - Discussed/ Gave Hypoglycemia treatment plan.  # Consult : not required at  this time.   # Annual urine for microalbuminuria/ creatinine ratio, + microalbuminuria currently, continue ACE/ARB /olmesartan . Last  Lab Results  Component Value Date   MICRALBCREAT 34 (H) 07/13/2023    # Foot check nightly.  # She has diabetic retinopathy, advised to follow-up with ophthalmology regularly.  - Diet: Make healthy diabetic food choices - Life style / activity / exercise: Discussed.  2. Blood pressure  -  BP Readings from Last 1 Encounters:  12/14/23 132/60    - Control is in target.  - No change in current plans.  3. Lipid status / Hyperlipidemia - Last  Lab Results  Component Value Date   LDLCALC 62 07/13/2023   - Continue pravastatin  80 mg daily.  [place] was seen today for diabetes.  Diagnoses and all orders for this visit:  Uncontrolled type 1 diabetes mellitus with hyperglycemia (HCC) -     Discontinue: Continuous Glucose Sensor (DEXCOM G7 SENSOR) MISC; 1 Device by Does not apply route as directed. Change sensor every 10 days -     Discontinue: Continuous Glucose Sensor (DEXCOM G7 SENSOR) MISC; 1 Device by Does not apply route as directed. Change sensor every 10 days -     Continuous Glucose Sensor (DEXCOM G7 SENSOR) MISC; 1 Device by Does not apply route as directed. Change sensor every 10 days -     insulin  lispro (HUMALOG  KWIKPEN) 100 UNIT/ML KwikPen; Inject 10-18 Units into the skin 3 (three) times daily. Maximum 54 units/day.  Type 1 diabetes mellitus with complications (HCC) -     glucose blood (FREESTYLE LITE) test strip; Use to check blood sugar 4 times a day as needed -     Insulin  Glargine (BASAGLAR  KWIKPEN) 100 UNIT/ML; Inject 32 Units into the skin 2 (two) times daily.   DISPOSITION Follow up in clinic in 4  months suggested.   All questions answered and patient verbalized understanding of the plan.  Aldine Chakraborty, MD [facility] [address] [place], [address] [place], [place] [address] Phone #  [phone]  At least part of this note was generated using voice recognition software. Inadvertent  word errors may have occurred, which were not recognized during the proofreading process.

## 2023-12-14 NOTE — Patient Instructions (Signed)
 Diabetes regimen  Increase  Basaglar  32 units in the morning and 32 units at bedtime. Adjust NovoLog /mealtime insulin  take 10 -18 units for regular meals 3 times a day.  Take 2 to 5 units of NovoLog  with snacks.  You can take up to 15 units of NovoLog  for large containing extra carbohydrate meal.  Use following sliding scale in addition to above regimen of NovoLog  for blood sugar before eating.  Mild Sliding Scale Blood Glucose        Insulin  60-150                     None 151-200                   None 201-250                   2 units 251-300                   4 units 301-350                   6 units 351-400                   8 units      >400                        9 units and call provider

## 2023-12-15 LAB — BASIC METABOLIC PANEL WITH GFR
BUN: 21 mg/dL (ref 7–25)
CO2: 26 mmol/L (ref 20–32)
Calcium: 9.8 mg/dL (ref 8.6–10.4)
Chloride: 104 mmol/L (ref 98–110)
Creat: 1.02 mg/dL (ref 0.50–1.05)
Glucose, Bld: 232 mg/dL — ABNORMAL HIGH (ref 65–139)
Potassium: 4.9 mmol/L (ref 3.5–5.3)
Sodium: 139 mmol/L (ref 135–146)
eGFR: 62 mL/min/1.73m2 (ref >=60)

## 2023-12-15 LAB — LIPID PANEL
Cholesterol: 134 mg/dL (ref <200)
HDL: 56 mg/dL (ref >=50)
LDL Cholesterol (Calc): 62 mg/dL
Non-HDL Cholesterol (Calc): 78 mg/dL (ref <130)
Total CHOL/HDL Ratio: 2.4 (calc) (ref <5.0)
Triglycerides: 77 mg/dL (ref <150)

## 2023-12-15 LAB — MICROALBUMIN / CREATININE URINE RATIO
Creatinine, Urine: 100 mg/dL (ref 20–275)
Microalb Creat Ratio: 34 mg/g{creat} — ABNORMAL HIGH (ref <30)
Microalb, Ur: 3.4 mg/dL

## 2024-01-26 ENCOUNTER — Other Ambulatory Visit: Payer: Self-pay | Admitting: Endocrinology

## 2024-01-26 DIAGNOSIS — E1065 Type 1 diabetes mellitus with hyperglycemia: Secondary | ICD-10-CM

## 2024-05-16 ENCOUNTER — Ambulatory Visit: Admitting: Endocrinology

## 2024-05-23 ENCOUNTER — Ambulatory Visit: Admitting: Endocrinology
